# Patient Record
Sex: Female | Born: 1975 | Race: White | Hispanic: No | Marital: Married | Smoking: Never smoker
Health system: Southern US, Community
[De-identification: ages and names within clinical notes are randomized; demographics above are authoritative.]

## PROBLEM LIST (undated history)

## (undated) DIAGNOSIS — M2550 Pain in unspecified joint: Secondary | ICD-10-CM

## (undated) DIAGNOSIS — K589 Irritable bowel syndrome without diarrhea: Secondary | ICD-10-CM

## (undated) DIAGNOSIS — G43909 Migraine, unspecified, not intractable, without status migrainosus: Secondary | ICD-10-CM

## (undated) DIAGNOSIS — N83209 Unspecified ovarian cyst, unspecified side: Secondary | ICD-10-CM

## (undated) DIAGNOSIS — Z9889 Other specified postprocedural states: Secondary | ICD-10-CM

## (undated) DIAGNOSIS — D6851 Activated protein C resistance: Secondary | ICD-10-CM

## (undated) DIAGNOSIS — F909 Attention-deficit hyperactivity disorder, unspecified type: Secondary | ICD-10-CM

## (undated) DIAGNOSIS — C4490 Unspecified malignant neoplasm of skin, unspecified: Secondary | ICD-10-CM

## (undated) DIAGNOSIS — N816 Rectocele: Secondary | ICD-10-CM

## (undated) DIAGNOSIS — F329 Major depressive disorder, single episode, unspecified: Secondary | ICD-10-CM

## (undated) DIAGNOSIS — R600 Localized edema: Secondary | ICD-10-CM

## (undated) DIAGNOSIS — F32A Depression, unspecified: Secondary | ICD-10-CM

## (undated) DIAGNOSIS — M549 Dorsalgia, unspecified: Secondary | ICD-10-CM

## (undated) DIAGNOSIS — Z8742 Personal history of other diseases of the female genital tract: Secondary | ICD-10-CM

## (undated) DIAGNOSIS — L4050 Arthropathic psoriasis, unspecified: Secondary | ICD-10-CM

## (undated) DIAGNOSIS — F419 Anxiety disorder, unspecified: Secondary | ICD-10-CM

## (undated) DIAGNOSIS — T8859XA Other complications of anesthesia, initial encounter: Secondary | ICD-10-CM

## (undated) DIAGNOSIS — D649 Anemia, unspecified: Secondary | ICD-10-CM

## (undated) DIAGNOSIS — C801 Malignant (primary) neoplasm, unspecified: Secondary | ICD-10-CM

## (undated) DIAGNOSIS — L409 Psoriasis, unspecified: Secondary | ICD-10-CM

## (undated) DIAGNOSIS — T4145XA Adverse effect of unspecified anesthetic, initial encounter: Secondary | ICD-10-CM

## (undated) DIAGNOSIS — N200 Calculus of kidney: Secondary | ICD-10-CM

## (undated) HISTORY — DX: Arthropathic psoriasis, unspecified: L40.50

## (undated) HISTORY — DX: Localized edema: R60.0

## (undated) HISTORY — DX: Unspecified malignant neoplasm of skin, unspecified: C44.90

## (undated) HISTORY — DX: Attention-deficit hyperactivity disorder, unspecified type: F90.9

## (undated) HISTORY — DX: Rectocele: N81.6

## (undated) HISTORY — DX: Irritable bowel syndrome, unspecified: K58.9

## (undated) HISTORY — DX: Anxiety disorder, unspecified: F41.9

## (undated) HISTORY — DX: Dorsalgia, unspecified: M54.9

## (undated) HISTORY — DX: Activated protein C resistance: D68.51

## (undated) HISTORY — DX: Other specified postprocedural states: Z98.890

## (undated) HISTORY — DX: Pain in unspecified joint: M25.50

## (undated) HISTORY — PX: SINUS ENDO WITH FUSION: SHX5329

## (undated) HISTORY — PX: CERVICAL BIOPSY  W/ LOOP ELECTRODE EXCISION: SUR135

## (undated) HISTORY — PX: ABDOMINAL HYSTERECTOMY: SHX81

## (undated) HISTORY — DX: Personal history of other diseases of the female genital tract: Z87.42

## (undated) HISTORY — DX: Psoriasis, unspecified: L40.9

## (undated) HISTORY — DX: Migraine, unspecified, not intractable, without status migrainosus: G43.909

## (undated) HISTORY — DX: Depression, unspecified: F32.A

## (undated) HISTORY — DX: Unspecified ovarian cyst, unspecified side: N83.209

## (undated) HISTORY — DX: Calculus of kidney: N20.0

## (undated) HISTORY — DX: Major depressive disorder, single episode, unspecified: F32.9

---
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
## 1995-12-08 DIAGNOSIS — Z8742 Personal history of other diseases of the female genital tract: Secondary | ICD-10-CM

## 1995-12-08 HISTORY — DX: Personal history of other diseases of the female genital tract: Z87.42

## 2001-01-25 ENCOUNTER — Other Ambulatory Visit: Admission: RE | Admit: 2001-01-25 | Discharge: 2001-01-25 | Payer: Self-pay | Admitting: Obstetrics and Gynecology

## 2002-05-15 ENCOUNTER — Other Ambulatory Visit: Admission: RE | Admit: 2002-05-15 | Discharge: 2002-05-15 | Payer: Self-pay | Admitting: Obstetrics and Gynecology

## 2003-05-08 ENCOUNTER — Other Ambulatory Visit: Admission: RE | Admit: 2003-05-08 | Discharge: 2003-05-08 | Payer: Self-pay | Admitting: Obstetrics and Gynecology

## 2004-07-15 ENCOUNTER — Other Ambulatory Visit: Admission: RE | Admit: 2004-07-15 | Discharge: 2004-07-15 | Payer: Self-pay | Admitting: Obstetrics and Gynecology

## 2004-10-08 ENCOUNTER — Encounter: Admission: RE | Admit: 2004-10-08 | Discharge: 2004-10-08 | Payer: Self-pay | Admitting: Otolaryngology

## 2005-07-20 ENCOUNTER — Other Ambulatory Visit: Admission: RE | Admit: 2005-07-20 | Discharge: 2005-07-20 | Payer: Self-pay | Admitting: Obstetrics and Gynecology

## 2006-04-29 ENCOUNTER — Other Ambulatory Visit: Admission: RE | Admit: 2006-04-29 | Discharge: 2006-04-29 | Payer: Self-pay | Admitting: Obstetrics and Gynecology

## 2006-12-02 ENCOUNTER — Inpatient Hospital Stay (HOSPITAL_COMMUNITY): Admission: AD | Admit: 2006-12-02 | Discharge: 2006-12-05 | Payer: Self-pay | Admitting: Obstetrics and Gynecology

## 2006-12-03 ENCOUNTER — Encounter (INDEPENDENT_AMBULATORY_CARE_PROVIDER_SITE_OTHER): Payer: Self-pay | Admitting: *Deleted

## 2008-12-07 HISTORY — PX: TUBAL LIGATION: SHX77

## 2009-01-29 ENCOUNTER — Ambulatory Visit (HOSPITAL_COMMUNITY): Admission: RE | Admit: 2009-01-29 | Discharge: 2009-01-29 | Payer: Self-pay | Admitting: Obstetrics and Gynecology

## 2009-05-30 ENCOUNTER — Inpatient Hospital Stay (HOSPITAL_COMMUNITY): Admission: RE | Admit: 2009-05-30 | Discharge: 2009-06-01 | Payer: Self-pay | Admitting: Obstetrics and Gynecology

## 2009-06-27 ENCOUNTER — Ambulatory Visit: Admission: RE | Admit: 2009-06-27 | Discharge: 2009-06-27 | Payer: Self-pay | Admitting: Obstetrics and Gynecology

## 2010-08-26 ENCOUNTER — Encounter: Admission: RE | Admit: 2010-08-26 | Discharge: 2010-08-26 | Payer: Self-pay | Admitting: Obstetrics and Gynecology

## 2010-12-28 ENCOUNTER — Encounter: Payer: Self-pay | Admitting: Unknown Physician Specialty

## 2011-03-16 LAB — CBC
HCT: 31.3 % — ABNORMAL LOW (ref 36.0–46.0)
HCT: 32.4 % — ABNORMAL LOW (ref 36.0–46.0)
HCT: 38.2 % (ref 36.0–46.0)
Hemoglobin: 11 g/dL — ABNORMAL LOW (ref 12.0–15.0)
Hemoglobin: 11.1 g/dL — ABNORMAL LOW (ref 12.0–15.0)
Hemoglobin: 13.1 g/dL (ref 12.0–15.0)
MCHC: 34.3 g/dL (ref 30.0–36.0)
MCHC: 34.4 g/dL (ref 30.0–36.0)
MCHC: 35.2 g/dL (ref 30.0–36.0)
MCV: 94.1 fL (ref 78.0–100.0)
MCV: 94.7 fL (ref 78.0–100.0)
MCV: 95.1 fL (ref 78.0–100.0)
Platelets: 109 10*3/uL — ABNORMAL LOW (ref 150–400)
Platelets: 123 10*3/uL — ABNORMAL LOW (ref 150–400)
Platelets: 95 10*3/uL — ABNORMAL LOW (ref 150–400)
RBC: 3.31 MIL/uL — ABNORMAL LOW (ref 3.87–5.11)
RBC: 3.4 MIL/uL — ABNORMAL LOW (ref 3.87–5.11)
RBC: 4.05 MIL/uL (ref 3.87–5.11)
RDW: 13.2 % (ref 11.5–15.5)
RDW: 13.5 % (ref 11.5–15.5)
RDW: 13.6 % (ref 11.5–15.5)
WBC: 6.2 10*3/uL (ref 4.0–10.5)
WBC: 7.9 10*3/uL (ref 4.0–10.5)
WBC: 9.4 10*3/uL (ref 4.0–10.5)

## 2011-03-16 LAB — RPR: RPR Ser Ql: NONREACTIVE

## 2011-04-21 NOTE — Op Note (Signed)
NAMEPENNEY, DOMANSKI               ACCOUNT NO.:  [id number]   MEDICAL RECORD NO.:  [id number]          PATIENT TYPE:  INP   LOCATION:  9103                          FACILITY:  [facility]   PHYSICIAN:  Dineen Kid. Rana Snare, M.D.    DATE OF BIRTH:  02/08/1976   DATE OF PROCEDURE:  05/30/2009  DATE OF DISCHARGE:                               OPERATIVE REPORT   PREOPERATIVE DIAGNOSIS:  Intrauterine pregnancy at 39 weeks, previous  cesarean, desired repeat, and multiparity desires sterility.   POSTOPERATIVE DIAGNOSIS:  Intrauterine pregnancy at 39 weeks, previous  cesarean, desired repeat, and multiparity desires sterility.   PROCEDURES:  Repeat low segment transverse cesarean section and tubal  ligation by Filshie clip.   SURGEON:  Dineen Kid. Rana Snare, MD   ANESTHESIA:  Spinal.   INDICATIONS:  Ms. Pirozzi is a 35 year old at 79 weeks, previous cesarean  section, desire repeat and tubal ligation.  Risks and benefits were  discussed.  Informed consent was obtained.   FINDINGS AT TIME OF SURGERY:  Viable female infant, Apgars 9 and 9.   DESCRIPTION OF PROCEDURE:  After adequate analgesia, the patient was  placed in the supine position, left lateral tilt.  She was sterilely  prepped and draped.  The bladder was sterilely drained with the Foley  catheter.  A Pfannenstiel skin incision was made two fingerbreadths  above the pubic symphysis, taken down sharply to fascia, which was  incised transversely, extended superiorly and inferiorly off the bellies  of the rectus muscle, were separated sharply in midline.  Peritoneum was  entered sharply.  Bladder flap created and placed behind the bladder  blade.  A low segment myotomy incision was made down to the infant's  vertex, extended laterally with the operator's fingertips.  Infant was  delivered using one pull vacuum extractor.  Nares and pharynx were then  suctioned.  The cord was clamped, cut, and handed to the pediatrician  for resuscitation.  Cord blood  was obtained.  Placenta extracted  manually.  The uterus was exteriorized, wiped clean with a dry lap.  Myotomy incision closed in two layers, first being running locking  layer, the second being imbricating layer of 0 Monocryl suture.  The  right and left fallopian tubes were identified by the fimbriated end.  Midportion of fallopian tube was identified.  Filshie clip was placed  across the entirety of the fallopian tubes.  Good placement noted across  the entirety of the tube.  The uterus then placed back into the  peritoneal cavity and after copious amount of irrigation, adequate  hemostasis was assured.  The peritoneum was closed with 0 Monocryl  suture.  Rectus muscle plicated in the midline.  Irrigation was applied  and after adequate hemostasis, the fascia was closed with #1 Vicryl in a  running fashion with good  approximation and good hemostasis noted.  Irrigation was applied and  after adequate hemostasis, skin stapled, Steri-Strips applied.  The  patient tolerated the procedure well, was stable on transfer to recovery  room.  Estimated blood loss, 700 mL.  Patient received 1 g of cefotetan  preoperatively.      Dineen Kid Rana Snare, M.D.  Electronically Signed     DCL/MEDQ  D:  05/30/2009  T:  05/31/2009  Job:  [id number]

## 2011-04-21 NOTE — Discharge Summary (Signed)
Autumn [name], Autumn [name]               ACCOUNT NO.:  [id number]   MEDICAL RECORD NO.:  [id number]          PATIENT TYPE:  INP   LOCATION:  9103                          [facility]ACILITY:  [facility]   PHYSICIAN:  Freddy Finner, M.D.   DATE OF BIRTH:  [birth year]   DATE OF ADMISSION:  05/30/2009  DATE OF DISCHARGE:  06/01/2009                               DISCHARGE SUMMARY   ADMITTING DIAGNOSES:  1. Intrauterine pregnancy at 85 weeks estimated gestational age.  2. Previous cesarean section, desires repeat.  3. Multiparity, desires permanent sterilization.   DISCHARGE DIAGNOSES:  1. Status post low-transverse cesarean section.  2. A viable female infant.  3. Permanent sterilization.   PROCEDURE:  1. Repeat low-transverse cesarean section.  2. Bilateral tubal ligation.   REASON FOR ADMISSION:  Please see written H and P.   HOSPITAL COURSE:  The patient is a 35 year old gravida 2, para 1 that  was admitted to [facility] for scheduled cesarean  section.  The patient had had a previous cesarean delivery and desired  repeat.  Due to multiparity, the patient also requested permanent  sterilization.  On the morning of admission, the patient was taken to  the operating room where spinal anesthesia was administered without  difficulty.  A low-transverse incision was made with delivery of a  viable female infant with Apgars of 9 at 1 minute and 9 at 5 minutes.  The  patient tolerated the procedure well and was taken to the recovery room  in stable condition.  On postoperative day #1, the patient was without  complaint.  Vital signs were stable.  She was afebrile.  Blood pressure  was 84/57 to 91/56.  Abdomen soft.  Fundus firm and nontender.  Abdominal dressings were noted to have a small amount of drainage noted  on the bandage.  Foley had been discontinued and the patient had not  voided at the time of rounding.   Laboratory findings showed hemoglobin of 11.0 and platelet count of  95,000.  CBC was ordered for the following morning.  On postoperative  day #2, the patient did desire early discharge.  Vital signs were  stable.  She was afebrile.  Abdomen soft.  Fundus firm and nontender.  Incision was noted to have a small amount of drainage noted from the  left part of the incision, no erythema or seroma was noted.  Staples  were left intact.  Repeat laboratory findings of a hemoglobin 11.5 and  platelet count was up to 109,000.  Discharge instructions were reviewed  and the patient was later discharged home.   CONDITION ON DISCHARGE:  Stable.   DIET:  Regular as tolerated.   ACTIVITY:  No heavy lifting, no driving x2 weeks, and no vaginal entry.   FOLLOWUP:  The patient is to follow up in the office in 2-3 days for  staple removal.  She is to call for temperature greater than 100  degrees, persistent nausea, vomiting, heavy vaginal bleeding and/or  redness, or drainage from the incisional site.   DISCHARGE MEDICATIONS:  1. Motrin 600 mg every 6 hours.  2. Prenatal vitamins 1 p.o. daily.      Julio Sicks, N.P.      Freddy Finner, M.D.  Electronically Signed    CC/MEDQ  D:  06/01/2009  T:  06/01/2009  Job:  [id number]

## 2011-04-21 NOTE — H&P (Signed)
NAMESELMA, Autumn [name]               ACCOUNT NO.:  [id number]   MEDICAL RECORD NO.:  [id number]          PATIENT TYPE:  INP   LOCATION:  NA                            [facility]  [facility]   PHYSICIAN:  Dineen Kid. Rana Snare, M.D.    DATE OF BIRTH:  Oct 17, 1976   DATE OF ADMISSION:  05/30/2009  DATE OF DISCHARGE:                              HISTORY & PHYSICAL   HISTORY OF PRESENT ILLNESS:  Ms. [name] is a 35 year old G5, P1, A3 at  62 weeks' gestational age who presents for repeat cesarean section.  Her  pregnancy has been complicated only by a previous cesarean section,  desires repeat.  She also has multiparity and desires sterility.  Her  EDC is June 06, 2009.   PAST MEDICAL HISTORY:  Negative.   PAST SURGICAL HISTORY:  She has had knee surgery, previous cesarean  section, rhinoplasty, ovarian cyst surgery, and sinus surgery.   MEDICATIONS:  Prenatal vitamins.   ALLERGIES:  She has no known drug allergies.   PHYSICAL EXAMINATION:  VITAL SIGNS:  Her blood pressure is 110/78.  HEART:  Regular rate and rhythm.  LUNGS:  Clear to auscultation bilaterally.  ABDOMEN:  Gravid, nontender.  Fetal heart tones are in 140s to 150s.  PELVIC:  Cervix is 2, 50 and high.   IMPRESSION:  Intrauterine pregnancy at 39+ weeks.  Previous cesarean  section, desires repeat.   PLAN:  1. Repeat low segment transverse cesarean section.  2. Multiparity, desires sterility, planned tubal ligation.  3. Discussed the risks and benefits of the above procedures which      include but are not limited to risk of infection, bleeding, damage      to the uterus, tubes, ovaries, bowel, bladder, fetus, risk of tubal      failure quoted at 04/999.  She gives informed consent and wishes to      proceed.      Dineen Kid Rana Snare, M.D.  Electronically Signed     DCL/MEDQ  D:  05/29/2009  T:  05/29/2009  Job:  [id number]

## 2011-04-24 NOTE — Discharge Summary (Signed)
[name], [name]               ACCOUNT NO.:  [id number]   MEDICAL RECORD NO.:  [id number]          PATIENT TYPE:   LOCATION:                                 FACILITY:   PHYSICIAN:  Michelle L. Vincente Poli, M.D.    DATE OF BIRTH:   DATE OF ADMISSION:  12/03/2006  DATE OF DISCHARGE:  12/05/2006                               DISCHARGE SUMMARY   ADMITTING DIAGNOSES:  1. Intrauterine pregnancy at term.  2. Spontaneous onset of labor.   DISCHARGE DIAGNOSIS:  1. Status post low-transverse cesarean section secondary to failure to      progress.  2. A viable female infant.   PROCEDURE:  Primary low-transverse cesarean section.   REASON FOR ADMISSION:  Please see written H and P.   HOSPITAL COURSE:  The patient is 35 year old, gravida 4, para 0, who was  admitted to [facility] at 40-2/7 weeks with spontaneous  onset of labor.  On admission, vital signs were stable.  Fetal heart  tones were in the 140s with reactivity.  Cervix was dilated to 3-4 cm,  completely efface, vertex plus or minus 1 station.  Artificial rupture  of membranes was performed with clear fluid noted.  Epidural was  administered for the patient's comfort.  The patient did progress to  full dilatation.  And after pushing for approximately 2 hours without  further descent beyond a zero station, the decision was made to proceed  with a primary low-transverse cesarean section.  The patient was then  transferred to the operating room where an epidural was dosed to an  adequate surgical level.  A low transverse incision was made with the  delivery of a viable female infant, weighing 8 pounds 9 ounces with Apgars  at 8 at 1 minute and 9 at 5 minutes.  The patient tolerated the  procedure well and was taken to the recovery room in stable condition.  On postoperative day #1, the patient was without complaint.  Vital signs  were stable.  She was afebrile.  Abdomen soft.  Fundus firm and  nontender.  Abdominal  dressing was noted to be clean, dry, and intact.   LABORATORY FINDINGS:  Revealed a hemoglobin of 11.9, platelet count  105,000, WBC count of 13.9. On postoperative day #2, the patient did  desire early discharge.  Vital signs were stable.  She was afebrile.  Abdomen soft.  Fundus firm and nontender.  Incision was clean, dry, and  intact.  Steri-Strips were applied.  The patient was later discharged  home.   CONDITION ON DISCHARGE:  Good.   DIET:  Regular as tolerated.   ACTIVITY:  No heavy lifting, no driving x2 weeks, no vaginal entry.   FOLLOW UP:  The patient to follow up in the office in 2 days for an  incision check.  She is to call for a temperature greater than 100  degrees, persistent nausea, vomiting, or any heavy vaginal bleeding,  and/or redness or drainage from the incisional site.   DISCHARGE MEDICATIONS:  1. Percocet 5/325 #30, one p.o. q. 4-6 hours p.r.n.  2. Motrin 600  mg every 6 hours.  3. Prenatal vitamins 1 p.o. daily.  4. Colace 100 p.o. daily p.r.n.      Julio Sicks, N.P.      Stann Mainland. Vincente Poli, M.D.  Electronically Signed    CC/MEDQ  D:  12/24/2006  T:  12/24/2006  Job:  [id number]

## 2011-04-24 NOTE — Op Note (Signed)
Autumn [name], Autumn [name]               ACCOUNT NO.:  [id number]   MEDICAL RECORD NO.:  [id number]          PATIENT TYPE:  INP   LOCATION:  9147                          FACILITY:  [facility]   PHYSICIAN:  Zelphia Cairo, MD    DATE OF BIRTH:  04-12-1976   DATE OF PROCEDURE:  12/03/2006  DATE OF DISCHARGE:                               OPERATIVE REPORT   PREOPERATIVE DIAGNOSES:  1. Intrauterine pregnancy at term.  2. Failure to progress/cephalopelvic disproportion.   PROCEDURE:  The primary low transverse cesarean delivery.   SURGEON:  Zelphia Cairo, MD   ESTIMATED BLOOD LOSS:  600 mL.   FINDINGS:  A viable infant with Apgars of 8 and 9 weighing 3900 grams.  Normal-appearing pelvic anatomy.   ANESTHESIA:  Epidural.   COMPLICATIONS:  None.   CONDITION:  Stable to recovery room.   SPECIMEN:  Placenta to pathology.   PROCEDURE:  The patient was taken to the operating room, where epidural  anesthesia was found to be adequate.  She was placed in a supine  position with a left tilt.  She was prepped and draped in sterile  fashion.  A Foley catheter was inserted in the bladder.  A scalpel was  used to make a skin incision.  This was carried down to the underlying  fascia.  The fascia was incised in the midline and this was extended  laterally.  The fascia was tented upwards with Kocher clamps and the  underlying rectus muscles were dissected off using the Bovie.  The  peritoneum was then identified, tented upwards, and entered sharply  using Metzenbaum scissors.  This was extended superiorly with good  visualization of the bladder.  The bladder blade was then inserted and  the bladder flap created with Metzenbaum scissors.  The bladder blade  was then reinserted to protect the bladder flap.   A scalpel was then used to make a uterine incision.  This was extended  bluntly.  The infant's vertex was brought to the uterine incision and a  vacuum was placed on the scalp.  The fetal head  was delivered through  the incision with fundal pressure.  The vacuum was released.  A nuchal  cord times one was easily reduced and the shoulders and body were  delivered without problems.  The infant's mouth and nose were suctioned  as the cord was clamped and cut and the infant was taken to the awaiting  pediatrics staff.  The placenta was then manually removed from the  uterus.  The uterus was then cleared of all clots and debris using a dry  sponge.  The uterus was then exteriorized from the pelvis.  The uterine  incision was closed using chromic in a running locked fashion,  hemostasis was assured, and the uterus was delivered back into the  pelvis.  The pelvis was then copiously irrigated with warm normal  saline.  The  peritoneum was closed using Vicryl.  The fascia was closed using PDS and  the skin was closed with staples.  The patient tolerated the procedure  well.  Sponge, lap, needle and  instrument counts were correct.  The  patient was taken to the recovery room in stable condition.      Zelphia Cairo, MD  Electronically Signed     GA/MEDQ  D:  12/03/2006  T:  12/03/2006  Job:  [id number]

## 2012-06-07 DIAGNOSIS — N946 Dysmenorrhea, unspecified: Secondary | ICD-10-CM | POA: Insufficient documentation

## 2012-06-07 DIAGNOSIS — C439 Malignant melanoma of skin, unspecified: Secondary | ICD-10-CM | POA: Insufficient documentation

## 2012-06-07 DIAGNOSIS — N816 Rectocele: Secondary | ICD-10-CM

## 2012-06-07 DIAGNOSIS — E669 Obesity, unspecified: Secondary | ICD-10-CM | POA: Insufficient documentation

## 2012-06-07 DIAGNOSIS — N200 Calculus of kidney: Secondary | ICD-10-CM

## 2012-06-07 DIAGNOSIS — G43829 Menstrual migraine, not intractable, without status migrainosus: Secondary | ICD-10-CM | POA: Insufficient documentation

## 2012-06-07 HISTORY — DX: Rectocele: N81.6

## 2012-06-07 HISTORY — DX: Calculus of kidney: N20.0

## 2013-03-13 ENCOUNTER — Other Ambulatory Visit: Payer: Self-pay | Admitting: Dermatology

## 2013-09-14 ENCOUNTER — Other Ambulatory Visit: Payer: Self-pay | Admitting: Dermatology

## 2014-04-24 ENCOUNTER — Other Ambulatory Visit: Payer: Self-pay | Admitting: Family Medicine

## 2014-04-24 ENCOUNTER — Ambulatory Visit
Admission: RE | Admit: 2014-04-24 | Discharge: 2014-04-24 | Disposition: A | Discharge status: Home / Self Care | Payer: BC Managed Care – PPO | Source: Ambulatory Visit | Attending: Family Medicine | Admitting: Family Medicine

## 2014-04-24 DIAGNOSIS — N63 Unspecified lump in unspecified breast: Secondary | ICD-10-CM

## 2014-04-24 DIAGNOSIS — N644 Mastodynia: Secondary | ICD-10-CM

## 2014-04-24 DIAGNOSIS — R0781 Pleurodynia: Secondary | ICD-10-CM

## 2014-05-04 ENCOUNTER — Ambulatory Visit
Admission: RE | Admit: 2014-05-04 | Discharge: 2014-05-04 | Disposition: A | Discharge status: Home / Self Care | Payer: BC Managed Care – PPO | Source: Ambulatory Visit | Attending: Family Medicine | Admitting: Family Medicine

## 2014-05-04 DIAGNOSIS — N63 Unspecified lump in unspecified breast: Secondary | ICD-10-CM

## 2014-05-04 DIAGNOSIS — N644 Mastodynia: Secondary | ICD-10-CM

## 2014-12-18 ENCOUNTER — Other Ambulatory Visit: Payer: Self-pay | Admitting: Obstetrics and Gynecology

## 2014-12-19 LAB — CYTOLOGY - PAP

## 2015-01-07 HISTORY — PX: VULVA /PERINEUM BIOPSY: SHX319

## 2015-01-10 ENCOUNTER — Other Ambulatory Visit: Payer: Self-pay | Admitting: Obstetrics and Gynecology

## 2015-05-16 ENCOUNTER — Ambulatory Visit (INDEPENDENT_AMBULATORY_CARE_PROVIDER_SITE_OTHER): Payer: BLUE CROSS/BLUE SHIELD | Admitting: Obstetrics & Gynecology

## 2015-05-16 ENCOUNTER — Encounter: Payer: Self-pay | Admitting: Obstetrics & Gynecology

## 2015-05-16 VITALS — BP 124/78 | HR 78 | Resp 14 | Ht 61.75 in | Wt 175.0 lb

## 2015-05-16 DIAGNOSIS — F39 Unspecified mood [affective] disorder: Secondary | ICD-10-CM | POA: Diagnosis not present

## 2015-05-16 DIAGNOSIS — N8110 Cystocele, unspecified: Secondary | ICD-10-CM | POA: Diagnosis not present

## 2015-05-16 DIAGNOSIS — N951 Menopausal and female climacteric states: Secondary | ICD-10-CM

## 2015-05-16 DIAGNOSIS — R4586 Emotional lability: Secondary | ICD-10-CM

## 2015-05-16 DIAGNOSIS — IMO0002 Reserved for concepts with insufficient information to code with codable children: Secondary | ICD-10-CM

## 2015-05-16 DIAGNOSIS — N816 Rectocele: Secondary | ICD-10-CM

## 2015-05-16 MED ORDER — BUPROPION HCL ER (XL) 150 MG PO TB24: 150.0000 mg | ORAL_TABLET | Freq: Every day | ORAL | Status: DC

## 2015-05-16 NOTE — Progress Notes (Addendum)
Patient ID: Autumn [name], female   DOB: [birth year], 39 y.o.   MRN: [id number]   39 y.o. G2P2 MarriedCaucasianF to discuss her rectocele and possible surgery in the future.  Pt had hx of very long delivery with first pregnancy.  She pushed for two hours and then went for a cesarean section.  Second delivery was planned cesarean section.  Does work really hard at keeping stool soft.  Doesn't have fecal or urinary incontinence.  Feels she can feel bulge at times right at the vagina.  Thinks is is coming from posterior aspect of vagina.  Has seen [name] who recommended surgery.  Wants another opinion.  Pt does feel like she needs to lose weight.  She is about 30-40 pounds heavier than she was at her wedding.  Spoke with [name] about this.  Blood work was all normal.  "Hormones and thyroid testing was negative".  Pt was treated with Lexapo and Phentermine.  Lexapro caused decreased libido which was significant for pt so she stopped this.  Has been on Prozac in the past but this caused fatigue.  Pt didn't really lose any weight with Phentermine.  She took it for four or five months, in total.  Pt weaned herself off the Lexapro and Wellbutrin.  Felt some of her symptoms were improved like the fatigue and libido issues but she knows she does have depressed mood since stopping.  Would like some recommendations.  Patient's last menstrual period was 05/15/2015.          Sexually active: Yes.    The current method of family planning is tubal ligation.    Exercising: Yes.    cardio and strength training Smoker:  no  Health Maintenance: Pap:  12-18-14 WNL  History of abnormal Pap:  Yes 1997 had LEEP procedure MMG:  09/2014 abnormal- U/S done as well- benign [facility]  Colonoscopy:  Never   BMD:   Never TDaP:  2013 with PCP Dr. Ernie Hew    reports that she has never smoked. She has never used smokeless tobacco. She reports that she drinks about 3.0 oz of alcohol per week. She reports that she does not  use illicit drugs.  Past Medical History  Diagnosis Date  . Rectocele   . Depression   . Anxiety   . History of abnormal cervical Pap smear 1997    dysplasia- LEEP  . Ovarian cyst 1994  . Migraine     Past Surgical History  Procedure Laterality Date  . Cervical biopsy  w/ loop electrode excision    . Ovarian cyst surgery  1994  . Cesarean section  2007/2010  . Tubal ligation  2010  . Vulva /perineum biopsy  01/2015    Current Outpatient Prescriptions  Medication Sig Dispense Refill  . fluticasone (FLONASE) 50 MCG/ACT nasal spray Place 1 spray into both nostrils daily.    . rizatriptan (MAXALT) 10 MG tablet Take 10 mg by mouth as needed for migraine. May repeat in 2 hours if needed    . BEPREVE 1.5 % SOLN   98  . buPROPion (WELLBUTRIN XL) 300 MG 24 hr tablet   5   No current facility-administered medications for this visit.    No family history on file.  ROS:  Pertinent items are noted in HPI.  Otherwise, a comprehensive ROS was negative.  Exam:   BP 124/78 mmHg  Pulse 78  Resp 14  Ht 5' 1.75" (1.568 m)  Wt 175 lb (79.379 kg)  BMI 32.29 kg/m2  LMP 05/15/2015   Height: 5' 1.75" (156.8 cm)  Ht Readings from Last 3 Encounters:  05/16/15 5' 1.75" (1.568 m)    General appearance: alert, cooperative and appears stated age Head: Normocephalic, without obvious abnormality, atraumatic Abdomen: soft, non-tender; bowel sounds normal; no masses,  no organomegaly Extremities: extremities normal, atraumatic, no cyanosis or edema Skin: Skin color, texture, turgor normal. No rashes or lesions Lymph nodes: Cervical, supraclavicular, and axillary nodes normal. No abnormal inguinal nodes palpated Neurologic: Grossly normal  Pelvic: External genitalia:  no lesions              Urethra:  normal appearing urethra with no masses, tenderness or lesions              Bartholins and Skenes: normal                 Vagina: normal appearing vagina with normal color and discharge, no  lesions, second degree cystocele and second degree recotcele              Cervix: no lesions              Pap taken: No. Bimanual Exam:  Uterus:  normal size, contour, position, consistency, mobility, non-tender              Adnexa: no mass, fullness, tenderness               Rectovaginal: Confirms               Anus:  normal sphincter tone, no lesions  Chaperone was present for exam.  A:  Cystocele and rectocele, Grade 2 (pt describes symptoms consistent with grade 4 rectocele) Depression H/O Cesarean section x 2 H/O LEEP 1997  P:   D/w pt surgery vs conservative management.  My honest opinion is that her exam today does not show enough significance for surgery and I would highly encourage a consultation with pelvic PT before proceeding with surgery.  Hysterectomy with anterior and posterior repair involving Dr. Quincy Simmonds was discussed with pt.  She understands another surgeon will be involved and is very comfortable with this.  Discussed involving Dr. Matilde Sprang as well, as another option, but she is not interested in this.   D/W pt treatment options for depression--SSRI's, SNRI's and Wellutrin with typical side effects of each class.  Pt was splitting Wellbutrin Xl in 1/2.  Feel should restart Wellbutrin XL 150mg  qd.  Advised pt not to split in half as this is an XL medication.  #30/5RF Day 3 FSH.  Pt on day 2 of cycle.  Will return tomorrow for this lab.  Doubtful of perimenopause.  However, can consider treatment with HRT if this is the case. Pt is considering homeopathic consultation.  D/W pt offices where I feel they are more conservative.  Pt will decide about this and make own appt. Recheck 2-3 months and pt will needs AEX after 1/17.  Lengthy visit with pt, approximately 45 minutes with >50% of this spent in face to face discussion.   06/07/15  Outside records reviewed.  Last pap normal.  No HR HPV testing 12/18/14.

## 2015-05-17 ENCOUNTER — Other Ambulatory Visit (INDEPENDENT_AMBULATORY_CARE_PROVIDER_SITE_OTHER): Payer: BLUE CROSS/BLUE SHIELD

## 2015-05-17 DIAGNOSIS — N951 Menopausal and female climacteric states: Secondary | ICD-10-CM

## 2015-05-18 LAB — FOLLICLE STIMULATING HORMONE: FSH: 8.5 m[IU]/mL

## 2015-06-18 ENCOUNTER — Telehealth: Payer: Self-pay | Admitting: Obstetrics & Gynecology

## 2015-06-18 NOTE — Telephone Encounter (Signed)
Patient is having side effects on the Wellbutrin and would like to stop taking. Would like to speak with nurse.

## 2015-06-18 NOTE — Telephone Encounter (Signed)
Spoke with patient. Patient states that she is currently taking Wellbutrin XL 150 mg daily. Since starting she has been having "forgetfullness" and tingling in her left finger tips. Patient would like to come off of Wellbutrin at this time. Has appointment scheduled for 08/01/2015 at 8:30am with Dr.Miller. Would like to discuss other options at that appointment and how she is doing after coming off the medication. Advised will need to take Wellbutrin XL 150 every other day for two weeks and then stop. Patient is agreeable. If symptoms persist after stopping medication will need to see PCP. If she would like to start new med after coming off before appointment in August will call our office.  Routing to provider for final review. Patient agreeable to disposition. Will close encounter.   Patient aware provider will review message and nurse will return call if any additional advice or change of disposition.

## 2015-08-01 ENCOUNTER — Ambulatory Visit (INDEPENDENT_AMBULATORY_CARE_PROVIDER_SITE_OTHER): Payer: BLUE CROSS/BLUE SHIELD | Admitting: Obstetrics & Gynecology

## 2015-08-01 VITALS — BP 104/64 | HR 64 | Resp 16 | Wt 180.0 lb

## 2015-08-01 DIAGNOSIS — N8110 Cystocele, unspecified: Secondary | ICD-10-CM

## 2015-08-01 DIAGNOSIS — R41840 Attention and concentration deficit: Secondary | ICD-10-CM | POA: Diagnosis not present

## 2015-08-01 DIAGNOSIS — IMO0002 Reserved for concepts with insufficient information to code with codable children: Secondary | ICD-10-CM

## 2015-08-01 DIAGNOSIS — F39 Unspecified mood [affective] disorder: Secondary | ICD-10-CM | POA: Diagnosis not present

## 2015-08-01 DIAGNOSIS — R4586 Emotional lability: Secondary | ICD-10-CM

## 2015-08-01 NOTE — Progress Notes (Signed)
39 y.o. G2P2 MarriedCaucasianF here for follow up of pelvic relaxation/cystocele, mood changes, weight changes, fatigue.  In regards to pelvic relaxation, pt has gone to physical therapy twice and doing exercises at home.  She feels these appts have been very helpful and she will plan to go for a couple more visits, at least.  Knows this will not "fix" problem but can, at least, help her hold off on surgery until something changes with symptoms.    In regards to mood, pt did start Wellbutrin XL 150mg  and didn't cut pills in half.  Pt felt like she had confusion with this.  She weaned herself off over another three to four weeks.  Has used Lexapro with phentermine which she said made her feel the best but she knows she can't take Phentermine long term.  Also, the Lexapro caused decreased libido.  Also tried Prozac which caused fatigue.  She was taking generic at the time.    Reports she feels moody all of the time and like she has PMS all of the time.  She also feels like she has fatigue a lot but she attributes this to her weight issues.  Pt has been considering her fatigue as well and feels part of her issue is never getting anything completely done so she runs around without completing tasks.  She is really interested in have ADD testing.  Has considered this for some time but never brought this up with anyone.  In regards to weight, pt has been on phentermine without significant weight loss.  Options for nutritionist or weight loss clinic discussed.  Pt will wait to see about ADD evaluation.  Knows these medications typically cause appetite suppression and weight loss.  A;  Depression/moodiness Cystocele and rectocele, Grade 2 but per hx at first exam seems more like Grade 4 Attention issues Weight gain  P: Information about testing for ADD given.  Pt will call clinic to schedule as they only allow self referral.   SNRI/SSRI options discussed.  Pt not sure if wants to start anything else but  cymbalta 30mg  daily or low dosed OCP are options.  Will let me know if wants this. Continue follow up with PT  ~25 minutes spent with patient >50% of time was in face to face discussion of above.

## 2015-08-02 ENCOUNTER — Encounter: Payer: Self-pay | Admitting: Obstetrics & Gynecology

## 2015-08-15 ENCOUNTER — Ambulatory Visit: Payer: BLUE CROSS/BLUE SHIELD | Admitting: Obstetrics & Gynecology

## 2015-09-10 ENCOUNTER — Other Ambulatory Visit: Payer: Self-pay

## 2015-09-10 DIAGNOSIS — Z1231 Encounter for screening mammogram for malignant neoplasm of breast: Secondary | ICD-10-CM

## 2015-10-09 ENCOUNTER — Ambulatory Visit
Admission: RE | Admit: 2015-10-09 | Discharge: 2015-10-09 | Disposition: A | Discharge status: Home / Self Care | Payer: BLUE CROSS/BLUE SHIELD | Source: Ambulatory Visit

## 2015-10-09 DIAGNOSIS — Z1231 Encounter for screening mammogram for malignant neoplasm of breast: Secondary | ICD-10-CM

## 2016-01-14 ENCOUNTER — Encounter: Payer: Self-pay | Admitting: Obstetrics & Gynecology

## 2016-01-14 ENCOUNTER — Ambulatory Visit (INDEPENDENT_AMBULATORY_CARE_PROVIDER_SITE_OTHER): Payer: BLUE CROSS/BLUE SHIELD | Admitting: Obstetrics & Gynecology

## 2016-01-14 VITALS — BP 106/80 | HR 86 | Resp 16 | Ht 61.75 in | Wt 165.0 lb

## 2016-01-14 DIAGNOSIS — Z124 Encounter for screening for malignant neoplasm of cervix: Secondary | ICD-10-CM

## 2016-01-14 DIAGNOSIS — Z01419 Encounter for gynecological examination (general) (routine) without abnormal findings: Secondary | ICD-10-CM

## 2016-01-14 NOTE — Progress Notes (Signed)
40 y.o. G2P2 MarriedCaucasianF here for annual exam.  Going to [name] now for management of ADD.  Reports she feels much better.  She has been on Adderall XR 25mg  since October.  Going every three months.  Pt feels like she is getting a lot more done and is being more effective in the gyn as she feels much more focused.    Cystocele and rectocele symptoms are improved as well.  Did see the physical therapist.  Doing some of the exercises at home.  Also seeing a nutritionist.  Feels like she is just a lot more in control.  Cycles are about every 26 days.  Flow lasts 5 days with some spotting for up to two more days.    PCP:  Dr. Ernie Hew  Patient's last menstrual period was 01/02/2016.          Sexually active: Yes.    The current method of family planning is tubal ligation.    Exercising: Yes.    Circuit training, cardio weights Smoker:  no  Health Maintenance: Pap:  12/18/14 neg History of abnormal Pap:  Yes, Hx LEEP MMG: 10/11/15 BIRADS1:neg Colonoscopy:  Never BMD:   Never TDaP:  2013 w/ PCP Screening Labs: PCP, Hb today: PCP, Urine today: PCP   reports that she has never smoked. She has never used smokeless tobacco. She reports that she drinks about 3.0 oz of alcohol per week. She reports that she does not use illicit drugs.  Past Medical History  Diagnosis Date  . Rectocele   . Depression   . Anxiety   . History of abnormal cervical Pap smear 1997    dysplasia- LEEP  . Ovarian cyst 1994  . Migraine     Past Surgical History  Procedure Laterality Date  . Cervical biopsy  w/ loop electrode excision    . Ovarian cyst surgery  1994  . Cesarean section  2007/2010  . Tubal ligation  2010  . Vulva /perineum biopsy  01/2015    Current Outpatient Prescriptions  Medication Sig Dispense Refill  . ADDERALL XR 25 MG 24 hr capsule Take 1 tablet by mouth daily.  0  . clobetasol (TEMOVATE) 0.05 % external solution   1  . fluticasone (FLONASE) 50 MCG/ACT nasal spray Place 1  spray into both nostrils daily.    . naproxen (NAPROSYN) 250 MG tablet Take 250 mg by mouth as needed.    . Probiotic Product (PROBIOTIC DAILY) CAPS Take 1 capsule by mouth daily.    . rizatriptan (MAXALT) 10 MG tablet Take 10 mg by mouth as needed for migraine. May repeat in 2 hours if needed     No current facility-administered medications for this visit.    History reviewed. No pertinent family history.  ROS:  Pertinent items are noted in HPI.  Otherwise, a comprehensive ROS was negative.  Exam:   BP 106/80 mmHg  Pulse 86  Resp 16  Ht 5' 1.75" (1.568 m)  Wt 165 lb (74.844 kg)  BMI 30.44 kg/m2  LMP 01/02/2016  Weight change: -15#  Height: 5' 1.75" (156.8 cm)  Ht Readings from Last 3 Encounters:  01/14/16 5' 1.75" (1.568 m)  05/16/15 5' 1.75" (1.568 m)    General appearance: alert, cooperative and appears stated age Head: Normocephalic, without obvious abnormality, atraumatic Neck: no adenopathy, supple, symmetrical, trachea midline and thyroid normal to inspection and palpation Lungs: clear to auscultation bilaterally Breasts: normal appearance, no masses or tenderness Heart: regular rate and rhythm Abdomen: soft,  non-tender; bowel sounds normal; no masses,  no organomegaly Extremities: extremities normal, atraumatic, no cyanosis or edema Skin: Skin color, texture, turgor normal. No rashes or lesions Lymph nodes: Cervical, supraclavicular, and axillary nodes normal. No abnormal inguinal nodes palpated Neurologic: Grossly normal   Pelvic: External genitalia:  no lesions              Urethra:  normal appearing urethra with no masses, tenderness or lesions              Bartholins and Skenes: normal                 Vagina: normal appearing vagina with normal color and discharge, no lesions              Cervix: no lesions              Pap taken: Yes.   Bimanual Exam:  Uterus:  normal size, contour, position, consistency, mobility, non-tender              Adnexa: normal  adnexa and no mass, fullness, tenderness               Rectovaginal: Confirms               Anus:  normal sphincter tone, no lesions  Chaperone was present for exam.  A:  Well Woman with normal exam ADD H/O melanoma, seeing [name] every eight months H/O LEEP around 1995 Lab work  P:   Mammogram guidelines discussed. pap smear with HR HPV testing today D/w pt IUD use vs endometrial ablation.  Information provided. return annually or prn

## 2016-01-17 LAB — IPS PAP TEST WITH HPV

## 2016-04-02 DIAGNOSIS — H10403 Unspecified chronic conjunctivitis, bilateral: Secondary | ICD-10-CM | POA: Diagnosis not present

## 2016-04-09 DIAGNOSIS — F338 Other recurrent depressive disorders: Secondary | ICD-10-CM | POA: Diagnosis not present

## 2016-04-09 DIAGNOSIS — F429 Obsessive-compulsive disorder, unspecified: Secondary | ICD-10-CM | POA: Diagnosis not present

## 2016-04-09 DIAGNOSIS — F900 Attention-deficit hyperactivity disorder, predominantly inattentive type: Secondary | ICD-10-CM | POA: Diagnosis not present

## 2016-04-09 DIAGNOSIS — F419 Anxiety disorder, unspecified: Secondary | ICD-10-CM | POA: Diagnosis not present

## 2016-04-09 DIAGNOSIS — Z79899 Other long term (current) drug therapy: Secondary | ICD-10-CM | POA: Diagnosis not present

## 2016-04-09 DIAGNOSIS — R41840 Attention and concentration deficit: Secondary | ICD-10-CM | POA: Diagnosis not present

## 2016-05-14 DIAGNOSIS — Z79899 Other long term (current) drug therapy: Secondary | ICD-10-CM | POA: Diagnosis not present

## 2016-05-14 DIAGNOSIS — F902 Attention-deficit hyperactivity disorder, combined type: Secondary | ICD-10-CM | POA: Diagnosis not present

## 2016-07-27 DIAGNOSIS — Z85820 Personal history of malignant melanoma of skin: Secondary | ICD-10-CM | POA: Diagnosis not present

## 2016-07-27 DIAGNOSIS — D224 Melanocytic nevi of scalp and neck: Secondary | ICD-10-CM | POA: Diagnosis not present

## 2016-07-27 DIAGNOSIS — D2262 Melanocytic nevi of left upper limb, including shoulder: Secondary | ICD-10-CM | POA: Diagnosis not present

## 2016-07-27 DIAGNOSIS — L814 Other melanin hyperpigmentation: Secondary | ICD-10-CM | POA: Diagnosis not present

## 2016-08-13 DIAGNOSIS — F429 Obsessive-compulsive disorder, unspecified: Secondary | ICD-10-CM | POA: Diagnosis not present

## 2016-08-13 DIAGNOSIS — F900 Attention-deficit hyperactivity disorder, predominantly inattentive type: Secondary | ICD-10-CM | POA: Diagnosis not present

## 2016-08-13 DIAGNOSIS — Z79899 Other long term (current) drug therapy: Secondary | ICD-10-CM | POA: Diagnosis not present

## 2016-08-13 DIAGNOSIS — F338 Other recurrent depressive disorders: Secondary | ICD-10-CM | POA: Diagnosis not present

## 2016-08-26 ENCOUNTER — Other Ambulatory Visit: Payer: Self-pay | Admitting: Obstetrics & Gynecology

## 2016-08-26 ENCOUNTER — Telehealth: Payer: Self-pay | Admitting: Obstetrics & Gynecology

## 2016-08-26 DIAGNOSIS — Z1231 Encounter for screening mammogram for malignant neoplasm of breast: Secondary | ICD-10-CM

## 2016-08-26 NOTE — Telephone Encounter (Signed)
Returned call to patient. Patient states that when she was here for her AEX back in February, she and [name] discussed OCPs vs ablation vs getting an IUD. Patient states she has taken a few months to think about it and she desires to use OCP's. Patient requesting an appointment to come in and talk with [name] about what OCP would work best for her. Office visit scheduled for Tuesday 09/01/16 at 0830. Patient agreeable to date and time and aware to arrive 15 minutes early for appointment.  Routing to provider for final review. Patient agreeable to disposition. Will close encounter.

## 2016-08-26 NOTE — Telephone Encounter (Signed)
Patient calling for an appointment to discus birth control options with Dr Sabra Heck.

## 2016-09-01 ENCOUNTER — Encounter: Payer: Self-pay | Admitting: Obstetrics & Gynecology

## 2016-09-01 ENCOUNTER — Ambulatory Visit (INDEPENDENT_AMBULATORY_CARE_PROVIDER_SITE_OTHER): Payer: BLUE CROSS/BLUE SHIELD | Admitting: Obstetrics & Gynecology

## 2016-09-01 VITALS — BP 102/70 | HR 76 | Resp 14 | Ht 62.0 in | Wt 173.2 lb

## 2016-09-01 DIAGNOSIS — D6851 Activated protein C resistance: Secondary | ICD-10-CM | POA: Insufficient documentation

## 2016-09-01 DIAGNOSIS — N943 Premenstrual tension syndrome: Secondary | ICD-10-CM | POA: Diagnosis not present

## 2016-09-01 DIAGNOSIS — N920 Excessive and frequent menstruation with regular cycle: Secondary | ICD-10-CM

## 2016-09-01 MED ORDER — NORETHINDRONE 0.35 MG PO TABS: 1.0000 | ORAL_TABLET | Freq: Every day | ORAL | 2 refills | Status: DC

## 2016-09-01 NOTE — Progress Notes (Signed)
GYNECOLOGY  VISIT   HPI: 40 y.o. G63P2002 Married Caucasian female here for discussion of hormonal symptoms and cycles.  Cycles are about every 24 to 25 days.  Flow lasts about 6-7 days.  First day is heavy and she's soaked through tampon and pad the last two months.  Reports she has about five good days each month due to the bleeding and to PMS symptoms she has before her cycle starts.    At first visit with pt, d/w her OCPs, Mirena IUD and endometrial ablation.  Pt recently had testing done for how she would do with certain mnedications.  This was done with Dr. Rachel Moulds who is helping to manage pt's ADD.  It was found that she is heterozygous for Factor V Leiden.  D/w pt mild increased risk of clotting.  Would prefer to not use estrogen containing OCPs with her, if possible.  GYNECOLOGIC HISTORY: Patient's last menstrual period was 08/24/2016. Contraception: BTL  Patient Active Problem List   Diagnosis Date Noted  . Hernia, rectovaginal 06/07/2012  . Adiposity 06/07/2012  . Calculus of kidney 06/07/2012  . Headache, menstrual migraine 06/07/2012  . Malignant melanoma ([place]) 06/07/2012  . Dysmenorrhea 06/07/2012    Past Medical History:  Diagnosis Date  . Anxiety   . Depression   . History of abnormal cervical Pap smear 1997   dysplasia- LEEP  . Migraine   . Ovarian cyst 1994  . Rectocele     Past Surgical History:  Procedure Laterality Date  . CERVICAL BIOPSY  W/ LOOP ELECTRODE EXCISION    . CESAREAN SECTION  2007/2010  . OVARIAN CYST SURGERY  1994  . TUBAL LIGATION  2010  . VULVA /PERINEUM BIOPSY  01/2015    MEDS:  Reviewed in EPIC and UTD  ALLERGIES: Review of patient's allergies indicates no known allergies.  No family history on file.  SH:  Married, non smoker  Review of Systems  All other systems reviewed and are negative.   PHYSICAL EXAMINATION:    BP 102/70 (BP Location: Right Arm, Patient Position: Sitting, Cuff Size: Normal)   Pulse 76   Resp 14    Ht 5\' 2"  (1.575 m)   Wt 173 lb 3.2 oz (78.6 kg)   LMP 08/24/2016   BMI 31.68 kg/m     General appearance: alert, cooperative and appears stated age No other exam performed  Chaperone was present for exam.  Assessment: Menorrhagia Shortened menstrual cycles at 24-25 days  PMS symptoms  Plan: Start miconor now.  Side effects reviewed.  May need to try other options if this doesn't work well for her.     ~15 minutes spent with patient >50% of time was in face to face discussion of above.

## 2016-10-09 ENCOUNTER — Ambulatory Visit: Payer: BLUE CROSS/BLUE SHIELD

## 2016-10-12 ENCOUNTER — Ambulatory Visit
Admission: RE | Admit: 2016-10-12 | Discharge: 2016-10-12 | Disposition: A | Discharge status: Home / Self Care | Payer: BLUE CROSS/BLUE SHIELD | Source: Ambulatory Visit | Attending: Obstetrics & Gynecology | Admitting: Obstetrics & Gynecology

## 2016-10-12 DIAGNOSIS — Z1231 Encounter for screening mammogram for malignant neoplasm of breast: Secondary | ICD-10-CM

## 2016-10-13 DIAGNOSIS — J019 Acute sinusitis, unspecified: Secondary | ICD-10-CM | POA: Diagnosis not present

## 2016-10-15 ENCOUNTER — Other Ambulatory Visit: Payer: Self-pay | Admitting: Obstetrics & Gynecology

## 2016-10-15 DIAGNOSIS — R928 Other abnormal and inconclusive findings on diagnostic imaging of breast: Secondary | ICD-10-CM

## 2016-10-21 ENCOUNTER — Ambulatory Visit
Admission: RE | Admit: 2016-10-21 | Discharge: 2016-10-21 | Disposition: A | Discharge status: Home / Self Care | Payer: BLUE CROSS/BLUE SHIELD | Source: Ambulatory Visit | Attending: Obstetrics & Gynecology | Admitting: Obstetrics & Gynecology

## 2016-10-21 DIAGNOSIS — R928 Other abnormal and inconclusive findings on diagnostic imaging of breast: Secondary | ICD-10-CM

## 2016-10-21 DIAGNOSIS — N6314 Unspecified lump in the right breast, lower inner quadrant: Secondary | ICD-10-CM | POA: Diagnosis not present

## 2016-11-11 DIAGNOSIS — Z23 Encounter for immunization: Secondary | ICD-10-CM | POA: Diagnosis not present

## 2016-11-12 DIAGNOSIS — Z79899 Other long term (current) drug therapy: Secondary | ICD-10-CM | POA: Diagnosis not present

## 2016-11-12 DIAGNOSIS — F900 Attention-deficit hyperactivity disorder, predominantly inattentive type: Secondary | ICD-10-CM | POA: Diagnosis not present

## 2016-11-12 DIAGNOSIS — F429 Obsessive-compulsive disorder, unspecified: Secondary | ICD-10-CM | POA: Diagnosis not present

## 2016-11-12 DIAGNOSIS — F338 Other recurrent depressive disorders: Secondary | ICD-10-CM | POA: Diagnosis not present

## 2016-12-22 DIAGNOSIS — M25562 Pain in left knee: Secondary | ICD-10-CM | POA: Diagnosis not present

## 2016-12-22 DIAGNOSIS — M25561 Pain in right knee: Secondary | ICD-10-CM | POA: Diagnosis not present

## 2016-12-30 DIAGNOSIS — M25561 Pain in right knee: Secondary | ICD-10-CM | POA: Diagnosis not present

## 2016-12-30 DIAGNOSIS — M25562 Pain in left knee: Secondary | ICD-10-CM | POA: Diagnosis not present

## 2017-01-07 DIAGNOSIS — M25561 Pain in right knee: Secondary | ICD-10-CM | POA: Diagnosis not present

## 2017-01-07 DIAGNOSIS — M25562 Pain in left knee: Secondary | ICD-10-CM | POA: Diagnosis not present

## 2017-01-26 DIAGNOSIS — L700 Acne vulgaris: Secondary | ICD-10-CM | POA: Diagnosis not present

## 2017-01-26 DIAGNOSIS — D2262 Melanocytic nevi of left upper limb, including shoulder: Secondary | ICD-10-CM | POA: Diagnosis not present

## 2017-01-26 DIAGNOSIS — Z85820 Personal history of malignant melanoma of skin: Secondary | ICD-10-CM | POA: Diagnosis not present

## 2017-01-26 DIAGNOSIS — L400 Psoriasis vulgaris: Secondary | ICD-10-CM | POA: Diagnosis not present

## 2017-02-09 DIAGNOSIS — F900 Attention-deficit hyperactivity disorder, predominantly inattentive type: Secondary | ICD-10-CM | POA: Diagnosis not present

## 2017-02-09 DIAGNOSIS — F429 Obsessive-compulsive disorder, unspecified: Secondary | ICD-10-CM | POA: Diagnosis not present

## 2017-02-09 DIAGNOSIS — Z79899 Other long term (current) drug therapy: Secondary | ICD-10-CM | POA: Diagnosis not present

## 2017-02-09 DIAGNOSIS — F902 Attention-deficit hyperactivity disorder, combined type: Secondary | ICD-10-CM | POA: Diagnosis not present

## 2017-02-15 NOTE — Progress Notes (Signed)
HPI:  Autumn [name] is here to establish care.  Last PCP and physical: 2 years ago with her Prior PCP. Now has been seeing her gyn for CPEs, [name].  Has the following chronic problems that require follow up and concerns today:  Migraines: -uses triptan prn -rare, hormonal triggers  ADHD: -has a history of chronic mild depression/anxiety; dx of bipolar d/o in the past - she does not think she has this -sees [name], [place] [facility] -taking a break from meds right now -was on Adzenys -sleep and GI issues are improving off medication -big challenge was moving from working to staying home with children - did counseling then -poor sleep -no severe depression or thoughts self harm, no manic symptoms  GI issues her whole issues: -flatulence, softer stools -her whole life -not a change -no: abd pain, nausea, vomiting, diarrhea, constipation -wants to test for celiacs  Hx of Melanoma/Psoriasis: -sees [name], Dermatologist for management  Weight: -admits diet is not great successful with ww in the past -exercising 4-5 days per week  ROS negative for unless reported above: fevers, unintentional weight loss, hearing or vision loss, chest pain, palpitations, struggling to breath, hemoptysis, melena, hematochezia, hematuria, falls, loc, si, thoughts of self harm  Past Medical History:  Diagnosis Date  . Anxiety   . Attention deficit hyperactivity disorder (ADHD) 02/16/2017   Sees [facility] for managment  . Calculus of kidney 06/07/2012   Overview:  Dr Karsten Ro - urology   . Depression   . Factor V Leiden carrier ([place])    heterozygous  . Hernia, rectovaginal 06/07/2012   Overview:  Dr Corinna Capra - gyn   . History of abnormal cervical Pap smear 1997   dysplasia- LEEP  . Migraine   . Ovarian cyst 1994  . Psoriasis 02/16/2017   -sees dermatologist  . Rectocele     Past Surgical History:  Procedure Laterality Date  . CERVICAL  BIOPSY  W/ LOOP ELECTRODE EXCISION    . CESAREAN SECTION  2007/2010  . OVARIAN CYST SURGERY  1994  . TUBAL LIGATION  2010  . VULVA /PERINEUM BIOPSY  01/2015    No family history on file.  Social History   Social History  . Marital status: Married    Spouse name: N/A  . Number of children: N/A  . Years of education: N/A   Social History Main Topics  . Smoking status: Never Smoker  . Smokeless tobacco: Never Used  . Alcohol use 3.0 oz/week    5 Standard drinks or equivalent per week  . Drug use: No  . Sexual activity: Yes    Partners: Male    Birth control/ protection: Surgical   Other Topics Concern  . Not on file   Social History Narrative   Work or School: homemaker      Home Situation: 2 children      Spiritual Beliefs:      Lifestyle: regular exercise        Current Outpatient Prescriptions:  .  BEPREVE 1.5 % SOLN, , Disp: , Rfl: 5 .  clindamycin (CLEOCIN T) 1 % lotion, Apply topically daily., Disp: , Rfl:  .  clobetasol (TEMOVATE) 0.05 % external solution, , Disp: , Rfl: 1 .  naproxen (NAPROSYN) 250 MG tablet, Take 250 mg by mouth as needed., Disp: , Rfl:  .  rizatriptan (MAXALT) 10 MG tablet, Take 10 mg by mouth as needed for migraine. May repeat in 2 hours if needed, Disp: ,  Rfl:   EXAM:  Vitals:   02/16/17 1101  BP: 102/72  Pulse: 77  Temp: 98 F (36.7 C)    Body mass index is 33.91 kg/m.   GENERAL: vitals reviewed and listed above, alert, oriented, appears well hydrated and in no acute distress  HEENT: atraumatic, conjunttiva clear, no obvious abnormalities on inspection of external nose and ears  NECK: no obvious masses on inspection  LUNGS: clear to auscultation bilaterally, no wheezes, rales or rhonchi, good air movement  CV: HRRR, no peripheral edema  MS: moves all extremities without noticeable abnormality  PSYCH: pleasant and cooperative, no obvious depression or anxiety  ASSESSMENT AND PLAN:  Discussed the following  assessment and plan: More than 50% of over 45 minutes spent in total in caring for this patient was spent face-to-face with the patient, counseling and/or coordinating care.    Encounter to establish care with new doctor  Attention deficit hyperactivity disorder (ADHD), unspecified ADHD type - Sees [facility] for managment  Mild episode of recurrent major depressive disorder ([place])  Insomnia, unspecified type  Other migraine without status migrainosus, not intractable  Flatulence  BMI 33.0-33.9,adult  Hx of malignant melanoma - -sees [name], dermatology  Psoriasis - -sees dermatologist -We reviewed the PMH, PSH, FH, SH, Meds and Allergies. -discussed her chronic health issues at length and discussed various ways to approach, evaluate further and treat -She is interested in pressuring CBT and brochure provided - she would prefer treating without medications as much as possible -she is interested in pursuing healthier diet and advised consideration Mediterranean diet and or wt watchers -at physical consider the following labs: celiac panel, tsh, cbc, bmp, vit D, b12 -follow up for CPE  -Patient advised to return or notify a doctor immediately if symptoms worsen or persist or new concerns arise.  Patient Instructions  BEFORE YOU LEAVE: -follow up: 3 months; come fasting if convenient  It was really nice to meet you today  Call to set up cognitive behavioral therapy.  Make sure eating several slices of bread daily.  Consider weight watchers.  We recommend the following healthy lifestyle for LIFE: 1) Small portions.   Tip: eat off of a salad plate instead of a dinner plate.  Tip: if you need more or a snack choose fruits, veggies and/or a handful of nuts or seeds.  2) Eat a healthy clean diet.  * Tip: Avoid (less then 1 serving per week): processed foods, sweets, sweetened drinks, white starches (rice, flour, bread, potatoes, pasta, etc), red meat,  fast foods, butter  *Tip: CHOOSE instead   * 5-9 servings per day of fresh or frozen fruits and vegetables (but not corn, potatoes, bananas, canned or dried fruit)   *nuts and seeds, beans   *olives and olive oil   *small portions of lean meats such as fish and white chicken    *small portions of whole grains  3)Get at least 150 minutes of sweaty aerobic exercise per week.  4)Reduce stress - consider counseling, meditation and relaxation to balance other aspects of your life.  WE NOW OFFER   [facility] FAST TRACK!!!  SAME DAY Appointments for ACUTE CARE  Such as: Sprains, Injuries, cuts, abrasions, rashes, muscle pain, joint pain, back pain Colds, flu, sore throats, headache, allergies, cough, fever  Ear pain, sinus and eye infections Abdominal pain, nausea, vomiting, diarrhea, upset stomach Animal/insect bites  3 Easy Ways to Schedule: Walk-In Scheduling Call in scheduling Mychart Sign-up: [email or web address]  Colin Benton R.

## 2017-02-16 ENCOUNTER — Ambulatory Visit (INDEPENDENT_AMBULATORY_CARE_PROVIDER_SITE_OTHER): Payer: BLUE CROSS/BLUE SHIELD | Admitting: Family Medicine

## 2017-02-16 ENCOUNTER — Encounter: Payer: Self-pay | Admitting: Family Medicine

## 2017-02-16 VITALS — BP 102/72 | HR 77 | Temp 98.0°F | Ht 60.75 in | Wt 178.0 lb

## 2017-02-16 DIAGNOSIS — G4700 Insomnia, unspecified: Secondary | ICD-10-CM

## 2017-02-16 DIAGNOSIS — G43809 Other migraine, not intractable, without status migrainosus: Secondary | ICD-10-CM

## 2017-02-16 DIAGNOSIS — F909 Attention-deficit hyperactivity disorder, unspecified type: Secondary | ICD-10-CM | POA: Diagnosis not present

## 2017-02-16 DIAGNOSIS — L409 Psoriasis, unspecified: Secondary | ICD-10-CM

## 2017-02-16 DIAGNOSIS — Z7689 Persons encountering health services in other specified circumstances: Secondary | ICD-10-CM

## 2017-02-16 DIAGNOSIS — R143 Flatulence: Secondary | ICD-10-CM | POA: Insufficient documentation

## 2017-02-16 DIAGNOSIS — F330 Major depressive disorder, recurrent, mild: Secondary | ICD-10-CM

## 2017-02-16 DIAGNOSIS — Z85820 Personal history of malignant melanoma of skin: Secondary | ICD-10-CM

## 2017-02-16 DIAGNOSIS — Z6833 Body mass index (BMI) 33.0-33.9, adult: Secondary | ICD-10-CM

## 2017-02-16 HISTORY — DX: Attention-deficit hyperactivity disorder, unspecified type: F90.9

## 2017-02-16 HISTORY — DX: Psoriasis, unspecified: L40.9

## 2017-02-16 NOTE — Progress Notes (Signed)
Pre visit review using our clinic review tool, if applicable. No additional management support is needed unless otherwise documented below in the visit note. 

## 2017-02-16 NOTE — Patient Instructions (Addendum)
BEFORE YOU LEAVE: -follow up: 3 months; come fasting if convenient  It was really nice to meet you today  Call to set up cognitive behavioral therapy.  Make sure eating several slices of bread daily.  Consider weight watchers.  We recommend the following healthy lifestyle for LIFE: 1) Small portions.   Tip: eat off of a salad plate instead of a dinner plate.  Tip: if you need more or a snack choose fruits, veggies and/or a handful of nuts or seeds.  2) Eat a healthy clean diet.  * Tip: Avoid (less then 1 serving per week): processed foods, sweets, sweetened drinks, white starches (rice, flour, bread, potatoes, pasta, etc), red meat, fast foods, butter  *Tip: CHOOSE instead   * 5-9 servings per day of fresh or frozen fruits and vegetables (but not corn, potatoes, bananas, canned or dried fruit)   *nuts and seeds, beans   *olives and olive oil   *small portions of lean meats such as fish and white chicken    *small portions of whole grains  3)Get at least 150 minutes of sweaty aerobic exercise per week.  4)Reduce stress - consider counseling, meditation and relaxation to balance other aspects of your life.  WE NOW OFFER   [facility] FAST TRACK!!!  SAME DAY Appointments for ACUTE CARE  Such as: Sprains, Injuries, cuts, abrasions, rashes, muscle pain, joint pain, back pain Colds, flu, sore throats, headache, allergies, cough, fever  Ear pain, sinus and eye infections Abdominal pain, nausea, vomiting, diarrhea, upset stomach Animal/insect bites  3 Easy Ways to Schedule: Walk-In Scheduling Call in scheduling Mychart Sign-up: [email or web address]

## 2017-02-19 ENCOUNTER — Telehealth: Payer: Self-pay | Admitting: Obstetrics & Gynecology

## 2017-02-19 ENCOUNTER — Other Ambulatory Visit: Payer: Self-pay | Admitting: Obstetrics & Gynecology

## 2017-02-19 MED ORDER — SCOPOLAMINE 1 MG/3DAYS TD PT72: 1.0000 | MEDICATED_PATCH | TRANSDERMAL | 1 refills | Status: DC

## 2017-02-19 NOTE — Telephone Encounter (Signed)
Spoke with patient, advised as seen below per [name]. Patient thankful for return call. Patient verbalizes understanding and is agreeable.

## 2017-02-19 NOTE — Telephone Encounter (Signed)
Patient would like to know if Dr Sabra Heck will send a prescription for an anti-nausea medication because she is going on a cruise. [employer] at [phone].

## 2017-02-19 NOTE — Telephone Encounter (Signed)
Usually, scopolamine patches are what is used for nausea for a cruise.  I sent a prescription to her pharmacy for this.

## 2017-02-19 NOTE — Telephone Encounter (Signed)
[name], please review patient message below and advise?

## 2017-03-04 DIAGNOSIS — R110 Nausea: Secondary | ICD-10-CM | POA: Diagnosis not present

## 2017-03-04 DIAGNOSIS — R194 Change in bowel habit: Secondary | ICD-10-CM | POA: Diagnosis not present

## 2017-03-04 DIAGNOSIS — E669 Obesity, unspecified: Secondary | ICD-10-CM | POA: Diagnosis not present

## 2017-03-04 DIAGNOSIS — R140 Abdominal distension (gaseous): Secondary | ICD-10-CM | POA: Diagnosis not present

## 2017-03-18 DIAGNOSIS — R140 Abdominal distension (gaseous): Secondary | ICD-10-CM | POA: Diagnosis not present

## 2017-03-18 DIAGNOSIS — R194 Change in bowel habit: Secondary | ICD-10-CM | POA: Diagnosis not present

## 2017-03-18 DIAGNOSIS — Z800 Family history of malignant neoplasm of digestive organs: Secondary | ICD-10-CM | POA: Diagnosis not present

## 2017-04-09 DIAGNOSIS — H5213 Myopia, bilateral: Secondary | ICD-10-CM | POA: Diagnosis not present

## 2017-04-09 DIAGNOSIS — H04123 Dry eye syndrome of bilateral lacrimal glands: Secondary | ICD-10-CM | POA: Diagnosis not present

## 2017-04-16 DIAGNOSIS — Z136 Encounter for screening for cardiovascular disorders: Secondary | ICD-10-CM | POA: Diagnosis not present

## 2017-04-16 DIAGNOSIS — Z0000 Encounter for general adult medical examination without abnormal findings: Secondary | ICD-10-CM | POA: Diagnosis not present

## 2017-04-17 ENCOUNTER — Encounter: Payer: Self-pay | Admitting: Obstetrics & Gynecology

## 2017-04-20 DIAGNOSIS — F900 Attention-deficit hyperactivity disorder, predominantly inattentive type: Secondary | ICD-10-CM | POA: Diagnosis not present

## 2017-04-20 DIAGNOSIS — Z23 Encounter for immunization: Secondary | ICD-10-CM | POA: Diagnosis not present

## 2017-04-20 DIAGNOSIS — Z0000 Encounter for general adult medical examination without abnormal findings: Secondary | ICD-10-CM | POA: Diagnosis not present

## 2017-04-20 DIAGNOSIS — F329 Major depressive disorder, single episode, unspecified: Secondary | ICD-10-CM | POA: Diagnosis not present

## 2017-04-20 DIAGNOSIS — R5383 Other fatigue: Secondary | ICD-10-CM | POA: Diagnosis not present

## 2017-04-20 DIAGNOSIS — Z6833 Body mass index (BMI) 33.0-33.9, adult: Secondary | ICD-10-CM | POA: Diagnosis not present

## 2017-04-29 ENCOUNTER — Ambulatory Visit (INDEPENDENT_AMBULATORY_CARE_PROVIDER_SITE_OTHER): Payer: BLUE CROSS/BLUE SHIELD | Admitting: Obstetrics & Gynecology

## 2017-04-29 ENCOUNTER — Encounter: Payer: Self-pay | Admitting: Obstetrics & Gynecology

## 2017-04-29 VITALS — BP 100/70 | HR 80 | Resp 16 | Ht 61.75 in | Wt 180.0 lb

## 2017-04-29 DIAGNOSIS — Z01419 Encounter for gynecological examination (general) (routine) without abnormal findings: Secondary | ICD-10-CM | POA: Diagnosis not present

## 2017-04-29 NOTE — Progress Notes (Addendum)
41 y.o. [id number] MarriedCaucasianF here for annual exam.  Doing well.  Cycles are between 23 - 28 days.  Not completely regular.  Cycles are heavier and crampier.     PCP:  Dr. Ernie Hew.  Having Hep A and Hep B vaccinations with her.  Lab work was done earlier this year.  This was all normal and she reports her cholesterol was better.    Using a stimulant for ADD.  This causes increased diarrhea and gas so takes a brake from time to time.  Has appt in June.  Feels better on a stimulant than off.    Did see Dr. Collene Mares.  Had some testing done and this was negative.  She was advised to have a colonoscopy at age 64.   Patient's last menstrual period was 04/16/2017.          Sexually active: Yes.    The current method of family planning is tubal ligation.    Exercising: Yes.    cardio, strength training  Smoker:  no  Health Maintenance: Pap:  01/14/16 Neg. HR HPV:neg  12/18/14 Neg  History of abnormal Pap:  Yes, Hx of LEEP  MMG:  10/21/16 BIRADS2:Benign  Colonoscopy:  Never BMD:   Never TDaP:  09/2012 Pneumonia vaccine(s):  No Zostavax:   N/A Hep C testing: N/A Screening Labs: PCP   reports that she has never smoked. She has never used smokeless tobacco. She reports that she drinks about 3.0 oz of alcohol per week . She reports that she does not use drugs.  Past Medical History:  Diagnosis Date  . Anxiety   . Attention deficit hyperactivity disorder (ADHD) 02/16/2017   Sees [facility] for managment  . Calculus of kidney 06/07/2012   Overview:  Dr Karsten Ro - urology   . Depression   . Factor V Leiden carrier ([place])    heterozygous  . Hernia, rectovaginal 06/07/2012   Overview:  Dr Corinna Capra - gyn   . History of abnormal cervical Pap smear 1997   dysplasia- LEEP  . Migraine   . Ovarian cyst 1994  . Psoriasis 02/16/2017   -sees dermatologist  . Rectocele     Past Surgical History:  Procedure Laterality Date  . CERVICAL BIOPSY  W/ LOOP ELECTRODE EXCISION    . CESAREAN  SECTION  2007/2010  . OVARIAN CYST SURGERY  1994  . TUBAL LIGATION  2010  . VULVA /PERINEUM BIOPSY  01/2015    Current Outpatient Prescriptions  Medication Sig Dispense Refill  . ADZENYS XR-ODT 9.4 MG TBED Take 1 tablet by mouth daily.  0  . BEPREVE 1.5 % SOLN   5  . clindamycin (CLEOCIN T) 1 % lotion Apply topically daily.    . clobetasol (TEMOVATE) 0.05 % external solution   1  . Multiple Vitamin (MULTIVITAMIN) tablet Take 1 tablet by mouth daily.    . naproxen (NAPROSYN) 250 MG tablet Take 250 mg by mouth as needed.    . Probiotic Product (PROBIOTIC DAILY) CAPS Take by mouth daily.    . rizatriptan (MAXALT) 10 MG tablet Take 10 mg by mouth as needed for migraine. May repeat in 2 hours if needed     No current facility-administered medications for this visit.     Family History  Problem Relation Age of Onset  . Esophageal cancer Father   . Esophageal cancer Maternal Uncle   . Rectal cancer Maternal Uncle   . Diabetes Maternal Grandmother   . Heart disease Maternal Grandmother  ROS:  Pertinent items are noted in HPI.  Otherwise, a comprehensive ROS was negative.  Exam:   BP 100/70 (BP Location: Right Arm, Patient Position: Sitting, Cuff Size: Large)   Pulse 80   Resp 16   Ht 5' 1.75" (1.568 m)   Wt 180 lb (81.6 kg)   LMP 04/16/2017   BMI 33.19 kg/m   Weight change: +15#  Height: 5' 1.75" (156.8 cm)  Ht Readings from Last 3 Encounters:  04/29/17 5' 1.75" (1.568 m)  02/16/17 5' 0.75" (1.543 m)  09/01/16 5\' 2"  (1.575 m)    General appearance: alert, cooperative and appears stated age Head: Normocephalic, without obvious abnormality, atraumatic Neck: no adenopathy, supple, symmetrical, trachea midline and thyroid normal to inspection and palpation Lungs: clear to auscultation bilaterally Breasts: normal appearance, no masses or tenderness Heart: regular rate and rhythm Abdomen: soft, non-tender; bowel sounds normal; no masses,  no organomegaly Extremities:  extremities normal, atraumatic, no cyanosis or edema Skin: Skin color, texture, turgor normal. No rashes or lesions Lymph nodes: Cervical, supraclavicular, and axillary nodes normal. No abnormal inguinal nodes palpated Neurologic: Grossly normal   Pelvic: External genitalia:  no lesions              Urethra:  normal appearing urethra with no masses, tenderness or lesions              Bartholins and Skenes: normal                 Vagina: normal appearing vagina with normal color and discharge, no lesions              Cervix: no lesions              Pap taken: No. Bimanual Exam:  Uterus:  normal size, contour, position, consistency, mobility, non-tender              Adnexa: normal adnexa and no mass, fullness, tenderness               Rectovaginal: Confirms               Anus:  normal sphincter tone, no lesions  Chaperone was present for exam.  A:  Well Woman with normal exam ADD, seeing Dr. Rachel Moulds H/O melanoma, seeing [name] H/O LEEP [phone] Mild depression  P:   Mammogram guidelines reviewed pap smear and HR HPV 2017, no pap obtained today Continues to see [name] for ADD Still considering IUD vs endometrial ablation. D/w pt taking Cymbalta 20mg  dosage return annually or prn

## 2017-05-13 DIAGNOSIS — F1920 Other psychoactive substance dependence, uncomplicated: Secondary | ICD-10-CM | POA: Diagnosis not present

## 2017-05-13 DIAGNOSIS — F900 Attention-deficit hyperactivity disorder, predominantly inattentive type: Secondary | ICD-10-CM | POA: Diagnosis not present

## 2017-05-13 DIAGNOSIS — F338 Other recurrent depressive disorders: Secondary | ICD-10-CM | POA: Diagnosis not present

## 2017-05-13 DIAGNOSIS — F429 Obsessive-compulsive disorder, unspecified: Secondary | ICD-10-CM | POA: Diagnosis not present

## 2017-05-13 DIAGNOSIS — F902 Attention-deficit hyperactivity disorder, combined type: Secondary | ICD-10-CM | POA: Diagnosis not present

## 2017-05-13 DIAGNOSIS — M722 Plantar fascial fibromatosis: Secondary | ICD-10-CM | POA: Diagnosis not present

## 2017-05-13 DIAGNOSIS — Z79899 Other long term (current) drug therapy: Secondary | ICD-10-CM | POA: Diagnosis not present

## 2017-05-13 DIAGNOSIS — M542 Cervicalgia: Secondary | ICD-10-CM | POA: Diagnosis not present

## 2017-05-13 DIAGNOSIS — M7631 Iliotibial band syndrome, right leg: Secondary | ICD-10-CM | POA: Diagnosis not present

## 2017-05-27 ENCOUNTER — Ambulatory Visit: Payer: BLUE CROSS/BLUE SHIELD | Admitting: Family Medicine

## 2017-06-17 ENCOUNTER — Ambulatory Visit: Payer: BLUE CROSS/BLUE SHIELD | Admitting: Family Medicine

## 2017-06-22 DIAGNOSIS — F331 Major depressive disorder, recurrent, moderate: Secondary | ICD-10-CM | POA: Diagnosis not present

## 2017-06-22 DIAGNOSIS — Z6833 Body mass index (BMI) 33.0-33.9, adult: Secondary | ICD-10-CM | POA: Diagnosis not present

## 2017-06-22 DIAGNOSIS — Z23 Encounter for immunization: Secondary | ICD-10-CM | POA: Diagnosis not present

## 2017-06-22 DIAGNOSIS — F3281 Premenstrual dysphoric disorder: Secondary | ICD-10-CM | POA: Diagnosis not present

## 2017-06-30 DIAGNOSIS — M25571 Pain in right ankle and joints of right foot: Secondary | ICD-10-CM | POA: Diagnosis not present

## 2017-07-06 ENCOUNTER — Emergency Department (HOSPITAL_COMMUNITY)
Admission: EM | Admit: 2017-07-06 | Discharge: 2017-07-06 | Disposition: A | Discharge status: Home / Self Care | Payer: BLUE CROSS/BLUE SHIELD | Attending: Emergency Medicine | Admitting: Emergency Medicine

## 2017-07-06 ENCOUNTER — Encounter (HOSPITAL_COMMUNITY): Payer: Self-pay

## 2017-07-06 DIAGNOSIS — Y999 Unspecified external cause status: Secondary | ICD-10-CM | POA: Insufficient documentation

## 2017-07-06 DIAGNOSIS — Z79899 Other long term (current) drug therapy: Secondary | ICD-10-CM | POA: Diagnosis not present

## 2017-07-06 DIAGNOSIS — Y92009 Unspecified place in unspecified non-institutional (private) residence as the place of occurrence of the external cause: Secondary | ICD-10-CM | POA: Insufficient documentation

## 2017-07-06 DIAGNOSIS — W260XXA Contact with knife, initial encounter: Secondary | ICD-10-CM | POA: Insufficient documentation

## 2017-07-06 DIAGNOSIS — S61215A Laceration without foreign body of left ring finger without damage to nail, initial encounter: Secondary | ICD-10-CM | POA: Diagnosis not present

## 2017-07-06 DIAGNOSIS — Y93G1 Activity, food preparation and clean up: Secondary | ICD-10-CM | POA: Diagnosis not present

## 2017-07-06 NOTE — ED Triage Notes (Signed)
Patient was cutting an avocado and cut herself between her 4th and 5th finger on L hand. She started to feel light headed and 5th finger felt numb/tingling so she came to get evaluated.

## 2017-07-06 NOTE — ED Provider Notes (Signed)
[facility] DEPT Provider Note   CSN: [id number] Arrival date & time: 07/06/17  [facility]     History   Chief Complaint Chief Complaint  Patient presents with  . Laceration    HPI Autumn [name] is a 41 y.o. female.  The history is provided by the patient and medical records.     41 y.o. F with history of anxiety, ADHD, depression, ovarian cyst, psoriasis, presenting to the ED for laceration. Patient states she was preparing dinner and cutting and avocado when the knife slipped and accidentally cut her between her fourth and fifth digits of left hand. States initially was bleeding but helped with direct pressure and holding her hand above her head.  States she did feel a bit faint from seeing the blood but that has resolved now.  Tetanus vaccine is up-to-date.  Past Medical History:  Diagnosis Date  . Anxiety   . Attention deficit hyperactivity disorder (ADHD) 02/16/2017   Sees [facility] for managment  . Calculus of kidney 06/07/2012   Overview:  Dr Karsten Ro - urology   . Depression   . Factor V Leiden carrier ([place])    heterozygous  . Hernia, rectovaginal 06/07/2012   Overview:  Dr Corinna Capra - gyn   . History of abnormal cervical Pap smear 1997   dysplasia- LEEP  . Migraine   . Ovarian cyst 1994  . Psoriasis 02/16/2017   -sees dermatologist  . Rectocele     Patient Active Problem List   Diagnosis Date Noted  . Mild episode of recurrent major depressive disorder ([place]) 02/16/2017  . Attention deficit hyperactivity disorder (ADHD) 02/16/2017  . Flatulence 02/16/2017  . BMI 33.0-33.9,adult 02/16/2017  . Psoriasis 02/16/2017  . Heterozygous factor V Leiden mutation ([place]) 09/01/2016  . Headache, menstrual migraine 06/07/2012  . Malignant melanoma ([place]) 06/07/2012  . Dysmenorrhea 06/07/2012    Past Surgical History:  Procedure Laterality Date  . CERVICAL BIOPSY  W/ LOOP ELECTRODE EXCISION    . CESAREAN SECTION  2007/2010  . OVARIAN CYST SURGERY   1994  . TUBAL LIGATION  2010  . VULVA /PERINEUM BIOPSY  01/2015    OB History    Gravida Para Term Preterm AB Living   2 2 2  0 0 2   SAB TAB Ectopic Multiple Live Births   0 0 0 0 2       Home Medications    Prior to Admission medications   Medication Sig Start Date End Date Taking? Authorizing Provider  ADZENYS XR-ODT 9.4 MG TBED Take 1 tablet by mouth daily. 04/27/17   [provider]  BEPREVE 1.5 % SOLN  06/13/16   [provider]  clindamycin (CLEOCIN T) 1 % lotion Apply topically daily.    [provider]  clobetasol (TEMOVATE) 0.05 % external solution  12/11/15   [provider]  Multiple Vitamin (MULTIVITAMIN) tablet Take 1 tablet by mouth daily.    [provider]  naproxen (NAPROSYN) 250 MG tablet Take 250 mg by mouth as needed.    [provider]  Probiotic Product (PROBIOTIC DAILY) CAPS Take by mouth daily. 06/07/12   [provider]  rizatriptan (MAXALT) 10 MG tablet Take 10 mg by mouth as needed for migraine. May repeat in 2 hours if needed    [provider]    Family History Family History  Problem Relation Age of Onset  . Esophageal cancer Father   . Esophageal cancer Maternal Uncle   . Rectal cancer Maternal Uncle   .  Diabetes Maternal Grandmother   . Heart disease Maternal Grandmother     Social History Social History  Substance Use Topics  . Smoking status: Never Smoker  . Smokeless tobacco: Never Used  . Alcohol use 3.0 oz/week    5 Standard drinks or equivalent per week     Allergies   Patient has no known allergies.   Review of Systems Review of Systems  Skin: Positive for wound.  All other systems reviewed and are negative.    Physical Exam Updated Vital Signs BP 97/64 (BP Location: Right Arm)   Pulse 67   Temp 98.6 F (37 C) (Oral)   Resp 18   Ht 5\' 2"  (1.575 m)   Wt 84.8 kg (187 lb)   SpO2 100%   BMI 34.20 kg/m   Physical Exam  Constitutional: She is  oriented to person, place, and time. She appears well-developed and well-nourished.  HENT:  Head: Normocephalic and atraumatic.  Mouth/Throat: Oropharynx is clear and moist.  Eyes: Pupils are equal, round, and reactive to light. Conjunctivae and EOM are normal.  Neck: Normal range of motion.  Cardiovascular: Normal rate, regular rhythm and normal heart sounds.   Pulmonary/Chest: Effort normal and breath sounds normal.  Abdominal: Soft. Bowel sounds are normal.  Musculoskeletal: Normal range of motion.  1cm superficial laceration to ulnar aspect of left 4th digit, proximal phalanx; there is no active bleeding; no deep tissue, vessel, or tendon involvement; normal flexion/extension  Neurological: She is alert and oriented to person, place, and time.  Skin: Skin is warm and dry.  Psychiatric: She has a normal mood and affect.  Nursing note and vitals reviewed.    ED Treatments / Results  Labs (all labs ordered are listed, but only abnormal results are displayed) Labs Reviewed - No data to display  EKG  EKG Interpretation None       Radiology No results found.  Procedures Procedures (including critical care time)  LACERATION REPAIR Performed by: Larene Pickett Authorized by: Larene Pickett Consent: Verbal consent obtained. Risks and benefits: risks, benefits and alternatives were discussed Consent given by: patient Patient identity confirmed: provided demographic data Prepped and Draped in normal sterile fashion Wound explored  Laceration Location: left 4th digit, ulnar aspect, proximal phalanx  Laceration Length: 1cm, superficial  No Foreign Bodies seen or palpated  Anesthesia: none  Local anesthetic: none  Anesthetic total: 0 ml  Irrigation method: syringe Amount of cleaning: standard  Skin closure: dermabond  Number of sutures: 0  Technique: n/a  Patient tolerance: Patient tolerated the procedure well with no immediate  complications.   Medications Ordered in ED Medications - No data to display   Initial Impression / Assessment and Plan / ED Course  I have reviewed the triage vital signs and the nursing notes.  Pertinent labs & imaging results that were available during my care of the patient were reviewed by me and considered in my medical decision making (see chart for details).  41 year old female here with very minor laceration to left fourth digit just prior to arrival. Bleeding is controlled. Wound is superficial without evidence of deep tissue, vessel, or tendon involvement. She has full flexion and extension of affected finger. Normal sensation and cap refill. Her tetanus vaccine is up-to-date. Given location and superficial nature, laceration was repaired with Dermabond, patient tolerated well. Good approximation of skin tissue.She appears stable for discharge. Discussed home wound care. Can follow-up with PCP if any ongoing issues.  Discussed plan with patient,  she acknowledged understanding and agreed with plan of care.  Return precautions given for new or worsening symptoms.  Final Clinical Impressions(s) / ED Diagnoses   Final diagnoses:  Laceration of left ring finger without foreign body without damage to nail, initial encounter    New Prescriptions New Prescriptions   No medications on file     Kathryne Hitch 07/06/17 1953    Carmin Muskrat, MD 07/07/17 [phone]

## 2017-07-06 NOTE — Discharge Instructions (Signed)
Dermabond will slough off over the next few days, try not to pull it off. Follow-up with your primary care doctor. Return here for any new/worsening symptoms.

## 2017-07-31 NOTE — Progress Notes (Signed)
[name] [address], [place] [address] Phone: [phone] Subjective:    I'm seeing this patient by the request  of:  Fanny Bien, MD   CC: Foot pain  [id number]  Autumn [name] is a 41 y.o. female coming in with complaint of foot pain. Patient has had this pain for multiple years. Patient states overall working down for some time and started having worsening symptoms. Son of the provider was told that is plantar fascial. Given some exercises with no improvement. Patient has discontinued working out regularly and continues to have pain. States it's mostly on the medial aspect of the foot.Worse with activity and better with rest. No swelling. No numbness. Rates the severity pain is 6 out of 10.  Patient also had an injury into her left ring finger. Was seen in the emergency room. Included together. Patient states that unfortunately since then though she has had chronic numbness and is unable to flex her pinky finger. Does not seem to be improving at this time. Has follow-up with a hand specialist in October but was not able to be seen prior to that.   Past Medical History:  Diagnosis Date  . Anxiety   . Attention deficit hyperactivity disorder (ADHD) 02/16/2017   Sees [facility] for managment  . Calculus of kidney 06/07/2012   Overview:  Dr Karsten Ro - urology   . Depression   . Factor V Leiden carrier ([place])    heterozygous  . Hernia, rectovaginal 06/07/2012   Overview:  Dr Corinna Capra - gyn   . History of abnormal cervical Pap smear 1997   dysplasia- LEEP  . Migraine   . Ovarian cyst 1994  . Psoriasis 02/16/2017   -sees dermatologist  . Rectocele    Past Surgical History:  Procedure Laterality Date  . CERVICAL BIOPSY  W/ LOOP ELECTRODE EXCISION    . CESAREAN SECTION  2007/2010  . OVARIAN CYST SURGERY  1994  . TUBAL LIGATION  2010  . VULVA /PERINEUM BIOPSY  01/2015   Social History   Social History  .  Marital status: Married    Spouse name: N/A  . Number of children: N/A  . Years of education: N/A   Social History Main Topics  . Smoking status: Never Smoker  . Smokeless tobacco: Never Used  . Alcohol use 3.0 oz/week    5 Standard drinks or equivalent per week  . Drug use: No  . Sexual activity: Yes    Partners: Male    Birth control/ protection: Surgical   Other Topics Concern  . Not on file   Social History Narrative   Work or School: homemaker      Home Situation: 2 children      Spiritual Beliefs:      Lifestyle: regular exercise      No Known Allergies Family History  Problem Relation Age of Onset  . Esophageal cancer Father   . Esophageal cancer Maternal Uncle   . Rectal cancer Maternal Uncle   . Diabetes Maternal Grandmother   . Heart disease Maternal Grandmother      Past medical history, social, surgical and family history all reviewed in electronic medical record.  No pertanent information unless stated regarding to the chief complaint.   Review of Systems:Review of systems updated and as accurate as of 07/31/17  No headache, visual changes, nausea, vomiting, diarrhea, constipation, dizziness, abdominal pain, skin rash, fevers, chills, night sweats, weight loss, swollen lymph nodes,  body aches, joint swelling, muscle aches, chest pain, shortness of breath, mood changes.   Objective  There were no vitals taken for this visit. Systems examined below as of 07/31/17   General: No apparent distress alert and oriented x3 mood and affect normal, dressed appropriately.  HEENT: Pupils equal, extraocular movements intact  Respiratory: Patient's speak in full sentences and does not appear short of breath  Cardiovascular: No lower extremity edema, non tender, no erythema  Skin: Warm dry intact with no signs of infection or rash on extremities or on axial skeleton.  Abdomen: Soft nontender  Neuro: Cranial nerves II through XII are intact, neurovascularly intact in  all extremities with 2+ DTRs and 2+ pulses.  Lymph: No lymphadenopathy of posterior or anterior cervical chain or axillae bilaterally.  Gait normal with good balance and coordination.  MSK:  Non tender with full range of motion and good stability and symmetric strength and tone of shoulders, elbows, wrist, hip, knee bilaterally.  Left hand exam shows the patient does not have any control over the flexion of the small finger. Does have extension. Neurovascularly patient show some numbness as well.  Ankle: Right No visible erythema or swelling. Range of motion is full in all directions. Strength is 5/5 in all directions. Stable lateral and medial ligaments; squeeze test and kleiger test unremarkable; Talar dome laterally tender; No pain at base of 5th MT; No tenderness over cuboid; No tenderness over N spot or navicular prominence No tenderness on posterior aspects of lateral and medial malleolus does have pain of the navicular bone. No sign of peroneal tendon subluxations or tenderness to palpation Negative tarsal tunnel tinel's Able to walk 4 steps. Contralateral foot unremarkable  MSK US performed of: Right This study was ordered, performed, and interpreted by Charlann Boxer D.O.  Foot/Ankle:   All structures visualized.   Talar dome unremarkable  Ankle mortise without effusion. Patient does have abnormality of the navicular bone. Patient does have what appears to be a callus formation but not full. Mild increase in Doppler flow noted. No hypoechoic changes.  IMPRESSION:  Impression: Slow healing navicular bone.  [id number]; 15 additional minutes spent for Therapeutic exercises as stated in above notes.  This included exercises focusing on stretching, strengthening, with significant focus on eccentric aspects.   Long term goals include an improvement in range of motion, strength, endurance as well as avoiding reinjury. Patient's frequency would include in 1-2 times a day, 3-5 times a week for  a duration of 6-12 weeks. Ankle strengthening that included:  Basic range of motion exercises to allow proper full motion at ankle Stretching of the lower leg and hamstrings  Theraband exercises for the lower leg - inversion, eversion, dorsiflexion and plantarflexion each to be completed with a theraband which was given  Balance exercises to increase proprioception Weight bearing exercises to increase strength and balance   Proper technique shown and discussed handout in great detail with ATC.  All questions were discussed and answered.       Impression and Recommendations:     This case required medical decision making of moderate complexity.      Note: This dictation was prepared with Dragon dictation along with smaller phrase technology. Any transcriptional errors that result from this process are unintentional.

## 2017-08-02 ENCOUNTER — Ambulatory Visit (INDEPENDENT_AMBULATORY_CARE_PROVIDER_SITE_OTHER): Payer: BLUE CROSS/BLUE SHIELD | Admitting: Family Medicine

## 2017-08-02 ENCOUNTER — Encounter: Payer: Self-pay | Admitting: Family Medicine

## 2017-08-02 ENCOUNTER — Ambulatory Visit: Payer: Self-pay

## 2017-08-02 VITALS — BP 110/78 | HR 82 | Ht 60.75 in | Wt 183.0 lb

## 2017-08-02 DIAGNOSIS — S64495A Injury of digital nerve of left ring finger, initial encounter: Secondary | ICD-10-CM | POA: Diagnosis not present

## 2017-08-02 DIAGNOSIS — M79671 Pain in right foot: Secondary | ICD-10-CM

## 2017-08-02 DIAGNOSIS — S92251A Displaced fracture of navicular [scaphoid] of right foot, initial encounter for closed fracture: Secondary | ICD-10-CM | POA: Insufficient documentation

## 2017-08-02 DIAGNOSIS — S92254A Nondisplaced fracture of navicular [scaphoid] of right foot, initial encounter for closed fracture: Secondary | ICD-10-CM | POA: Diagnosis not present

## 2017-08-02 MED ORDER — PREDNISONE 50 MG PO TABS: 50.0000 mg | ORAL_TABLET | Freq: Every day | ORAL | 0 refills | Status: DC

## 2017-08-02 MED ORDER — VITAMIN D (ERGOCALCIFEROL) 1.25 MG (50000 UNIT) PO CAPS: 50000.0000 [IU] | ORAL_CAPSULE | ORAL | 0 refills | Status: DC

## 2017-08-02 NOTE — Assessment & Plan Note (Signed)
Ultrasound showed that patient's flexor tendon seems to be intact. Patient does have significant weakness. I believe that there is potential injury to the digital nerve of the left ring finger with patient also having numbness. Patient will be started on prednisone to see that makes a difference with the nerve as well as B6 and B12. Encourage her to follow-up with a hand specialist for other interventions that may be necessary.

## 2017-08-02 NOTE — Assessment & Plan Note (Signed)
I believe the patient does have more of a navicular stress fracture that is causing the tightness now of the posterior tibialis. Patient work with Product/process development scientist to learn home exercises in greater detail. We discussed which activities doing which ones to avoid. Patient will avoid being barefoot. Once weekly vitamin D given as well. Patient will follow-up again 34 weeks.

## 2017-08-02 NOTE — Patient Instructions (Addendum)
Good to see you.  For the foot  Exercises 3 times a week.  Avoid being barefoot No high impact exercises Once weekly vitamin D for 12 weeks.   For ythe hand  Wear the splint day and night for next month till you see gramig.  Can try to move it a little bit but be careful The nerve is likely affected.  Prednisone daily for 5 days   Over the counter B12 1054mcg daily  B6 200mg  daily   See me again in 3-4 weeks

## 2017-08-13 DIAGNOSIS — F900 Attention-deficit hyperactivity disorder, predominantly inattentive type: Secondary | ICD-10-CM | POA: Diagnosis not present

## 2017-08-13 DIAGNOSIS — Z79899 Other long term (current) drug therapy: Secondary | ICD-10-CM | POA: Diagnosis not present

## 2017-08-13 DIAGNOSIS — F419 Anxiety disorder, unspecified: Secondary | ICD-10-CM | POA: Diagnosis not present

## 2017-08-13 DIAGNOSIS — F429 Obsessive-compulsive disorder, unspecified: Secondary | ICD-10-CM | POA: Diagnosis not present

## 2017-08-13 DIAGNOSIS — F338 Other recurrent depressive disorders: Secondary | ICD-10-CM | POA: Diagnosis not present

## 2017-08-24 ENCOUNTER — Other Ambulatory Visit (INDEPENDENT_AMBULATORY_CARE_PROVIDER_SITE_OTHER): Payer: BLUE CROSS/BLUE SHIELD

## 2017-08-24 ENCOUNTER — Ambulatory Visit (INDEPENDENT_AMBULATORY_CARE_PROVIDER_SITE_OTHER): Payer: BLUE CROSS/BLUE SHIELD | Admitting: Family Medicine

## 2017-08-24 ENCOUNTER — Encounter: Payer: Self-pay | Admitting: Family Medicine

## 2017-08-24 VITALS — BP 120/70 | HR 80 | Ht 61.0 in | Wt 184.0 lb

## 2017-08-24 DIAGNOSIS — M2550 Pain in unspecified joint: Secondary | ICD-10-CM

## 2017-08-24 DIAGNOSIS — M130 Polyarthritis, unspecified: Secondary | ICD-10-CM

## 2017-08-24 DIAGNOSIS — S92254D Nondisplaced fracture of navicular [scaphoid] of right foot, subsequent encounter for fracture with routine healing: Secondary | ICD-10-CM | POA: Diagnosis not present

## 2017-08-24 DIAGNOSIS — G5701 Lesion of sciatic nerve, right lower limb: Secondary | ICD-10-CM | POA: Diagnosis not present

## 2017-08-24 LAB — COMPREHENSIVE METABOLIC PANEL
ALBUMIN: 3.8 g/dL (ref 3.5–5.2)
ALT: 17 U/L (ref 0–35)
AST: 17 U/L (ref 0–37)
Alkaline Phosphatase: 56 U/L (ref 39–117)
BILIRUBIN TOTAL: 0.8 mg/dL (ref 0.2–1.2)
BUN: 12 mg/dL (ref 6–23)
CALCIUM: 9 mg/dL (ref 8.4–10.5)
CO2: 28 mEq/L (ref 19–32)
CREATININE: 0.7 mg/dL (ref 0.40–1.20)
Chloride: 105 mEq/L (ref 96–112)
GFR: 97.02 mL/min (ref >60.00)
Glucose, Bld: 74 mg/dL (ref 70–99)
Potassium: 3.6 mEq/L (ref 3.5–5.1)
Sodium: 140 mEq/L (ref 135–145)
Total Protein: 6.6 g/dL (ref 6.0–8.3)

## 2017-08-24 LAB — CBC WITH DIFFERENTIAL/PLATELET
BASOS ABS: 0 10*3/uL (ref 0.0–0.1)
Basophils Relative: 0.5 % (ref 0.0–3.0)
Eosinophils Absolute: 0.1 10*3/uL (ref 0.0–0.7)
Eosinophils Relative: 1 % (ref 0.0–5.0)
HCT: 38.9 % (ref 36.0–46.0)
Hemoglobin: 12.9 g/dL (ref 12.0–15.0)
LYMPHS ABS: 1.9 10*3/uL (ref 0.7–4.0)
Lymphocytes Relative: 34.1 % (ref 12.0–46.0)
MCHC: 33.2 g/dL (ref 30.0–36.0)
MCV: 89.9 fl (ref 78.0–100.0)
MONOS PCT: 6.7 % (ref 3.0–12.0)
Monocytes Absolute: 0.4 10*3/uL (ref 0.1–1.0)
Neutro Abs: 3.3 10*3/uL (ref 1.4–7.7)
Neutrophils Relative %: 57.7 % (ref 43.0–77.0)
Platelets: 212 10*3/uL (ref 150.0–400.0)
RBC: 4.32 Mil/uL (ref 3.87–5.11)
RDW: 12.8 % (ref 11.5–15.5)
WBC: 5.7 10*3/uL (ref 4.0–10.5)

## 2017-08-24 LAB — VITAMIN D 25 HYDROXY (VIT D DEFICIENCY, FRACTURES): VITD: 30.64 ng/mL (ref 30.00–100.00)

## 2017-08-24 LAB — IBC PANEL
Iron: 140 ug/dL (ref 42–145)
Saturation Ratios: 40.7 % (ref 20.0–50.0)
Transferrin: 246 mg/dL (ref 212.0–360.0)

## 2017-08-24 LAB — C-REACTIVE PROTEIN: CRP: 0.1 mg/dL — AB (ref 0.5–20.0)

## 2017-08-24 LAB — T3, FREE: T3, Free: 3.1 pg/mL (ref 2.3–4.2)

## 2017-08-24 LAB — TSH: TSH: 2.3 u[IU]/mL (ref 0.35–4.50)

## 2017-08-24 LAB — T4, FREE: Free T4: 0.81 ng/dL (ref 0.60–1.60)

## 2017-08-24 LAB — SEDIMENTATION RATE: Sed Rate: 6 mm/hr (ref 0–20)

## 2017-08-24 LAB — CORTISOL: CORTISOL PLASMA: 6.3 ug/dL

## 2017-08-24 MED ORDER — GABAPENTIN 100 MG PO CAPS: 200.0000 mg | ORAL_CAPSULE | Freq: Every day | ORAL | 3 refills | Status: DC

## 2017-08-24 NOTE — Assessment & Plan Note (Signed)
Patient is no swelling. No significant pain over the navicular bone at this time. Discussed icing regimen, home exercises, discussed continuing the over-the-counter orthotics. Think that this will be beneficial. Follow-up again in 4-6 weeks

## 2017-08-24 NOTE — Patient Instructions (Addendum)
Good to see you  .Ice 20 minutes 2 times daily. Usually after activity and before bed. The foot looks good New exercises 3 times a week.  Get labs downstairs Gabapentin 200mg  at night may help with the pain in the leg  I think we will breakthrough at some point lets just see if labs give [place] some direction.  I wish you luck on the finger See me again in 4 weeks.

## 2017-08-24 NOTE — Assessment & Plan Note (Signed)
Piriformis Syndrome  Using an anatomical model, reviewed with the patient the structures involved and how they related to diagnosis. The patient indicated understanding.   The patient was given a handout from Dr. Rouzier's book "The Sports Medicine Patient Advisor" describing the anatomy and rehabilitation of the following condition: Piriformis Syndrome  Also given a handout with more extensive Piriformis stretching, hip flexor and abductor strengthening, ham stretching  Rec deep massage, explained self-massage with ball RTC in 4 weeks 

## 2017-08-24 NOTE — Assessment & Plan Note (Signed)
Patient will have laboratory work him to rule out any other causes a patient's discomfort and pain. History of psoriasis and possible psoriatic arthritis could be contribute in. Vitamin D deficiency, adrenal insufficiency, as well as anemia could all be possible.

## 2017-08-24 NOTE — Progress Notes (Signed)
[name] [place],  [address] Phone: [phone] Subjective:    I'm seeing this patient by the request  of:    CC: Right hip pain, right foot pain  [id number]  Autumn [name] is a 41 y.o. female coming in with complaint of right foot pain. Says her foot did make improvements with the Prednisone however her foot is still bothering her. Patient can use to have a dull, throbbing aching sensation in the foot but it seems to be less than previously.  States now that the hip pain seems to be the worse. States that it is more in the buttocks area. Rates the severity of pain as 5 out of 10. States that sitting for long amount of time can cause more pounds. Has seen different providers for this previously.  Having more muscle aches over the course of time. Patient does have a history of psoriasis. Significant autoimmune diseases she states has been somewhat diagnosed in her family.       Past Medical History:  Diagnosis Date  . Anxiety   . Attention deficit hyperactivity disorder (ADHD) 02/16/2017   Sees [facility] for managment  . Calculus of kidney 06/07/2012   Overview:  Dr Karsten Ro - urology   . Depression   . Factor V Leiden carrier ([place])    heterozygous  . Hernia, rectovaginal 06/07/2012   Overview:  Dr Corinna Capra - gyn   . History of abnormal cervical Pap smear 1997   dysplasia- LEEP  . Migraine   . Ovarian cyst 1994  . Psoriasis 02/16/2017   -sees dermatologist  . Rectocele    Past Surgical History:  Procedure Laterality Date  . CERVICAL BIOPSY  W/ LOOP ELECTRODE EXCISION    . CESAREAN SECTION  2007/2010  . OVARIAN CYST SURGERY  1994  . TUBAL LIGATION  2010  . VULVA /PERINEUM BIOPSY  01/2015   Social History   Social History  . Marital status: Married    Spouse name: N/A  . Number of children: N/A  . Years of education: N/A   Social History Main Topics  . Smoking status: Never Smoker  .  Smokeless tobacco: Never Used  . Alcohol use 3.0 oz/week    5 Standard drinks or equivalent per week  . Drug use: No  . Sexual activity: Yes    Partners: Male    Birth control/ protection: Surgical   Other Topics Concern  . None   Social History Narrative   Work or School: homemaker      Home Situation: 2 children      Spiritual Beliefs:      Lifestyle: regular exercise      No Known Allergies Family History  Problem Relation Age of Onset  . Esophageal cancer Father   . Esophageal cancer Maternal Uncle   . Rectal cancer Maternal Uncle   . Diabetes Maternal Grandmother   . Heart disease Maternal Grandmother    Family history of psoriasis  Past medical history, social, surgical and family history all reviewed in electronic medical record.  No pertanent information unless stated regarding to the chief complaint.   Review of Systems:Review of systems updated and as accurate as of 08/24/17  No headache, visual changes, nausea, vomiting, diarrhea, constipation, dizziness, abdominal pain, skin rash, fevers, chills, night sweats, weight loss, swollen lymph nodes,  joint swelling,  chest pain, shortness of breath, mood changes. Positive body aches, muscle aches,  Objective  Blood pressure  120/70, pulse 80, height 5\' 1"  (1.549 m), weight 184 lb (83.5 kg), SpO2 99 %. Systems examined below as of 08/24/17   General: No apparent distress alert and oriented x3 mood and affect normal, dressed appropriately.  HEENT: Pupils equal, extraocular movements intact  Respiratory: Patient's speak in full sentences and does not appear short of breath  Cardiovascular: No lower extremity edema, non tender, no erythema  Skin: Warm dry intact with no signs of infection or rash on extremities or on axial skeleton.  Abdomen: Soft nontender  Neuro: Cranial nerves II through XII are intact, neurovascularly intact in all extremities with 2+ DTRs and 2+ pulses.  Lymph: No lymphadenopathy of posterior or  anterior cervical chain or axillae bilaterally.  Gait normal with good balance and coordination.  MSK:  Non tender with full range of motion and good stability and symmetric strength and tone of shoulders, elbows, wrist, hip, knee and ankles bilaterally.  Foot exam shows very minimal discomfort over the navicular bone. Back Exam:  Inspection: Poor core strength Motion: Flexion 45 deg, Extension 25 deg, Side Bending to 45 deg bilaterally,  Rotation to 45 deg bilaterally  SLR laying: Negative  XSLR laying: Negative  Palpable tenderness: Positive with pain in the piriformis. FABER: negative. Sensory change: Gross sensation intact to all lumbar and sacral dermatomes.  Reflexes: 2+ at both patellar tendons, 2+ at achilles tendons, Babinski's downgoing.  Strength at foot  Plantar-flexion: 5/5 Dorsi-flexion: 5/5 Eversion: 5/5 Inversion: 5/5  Leg strength  Quad: 5/5 Hamstring: 5/5 Hip flexor: 5/5 Hip abductors: 4/5 but symmetric Gait unremarkable.      Impression and Recommendations:     This case required medical decision making of moderate complexity.      Note: This dictation was prepared with Dragon dictation along with smaller phrase technology. Any transcriptional errors that result from this process are unintentional.

## 2017-08-25 ENCOUNTER — Encounter: Payer: Self-pay | Admitting: Family Medicine

## 2017-08-25 DIAGNOSIS — R768 Other specified abnormal immunological findings in serum: Secondary | ICD-10-CM

## 2017-08-25 NOTE — Progress Notes (Unsigned)
A lot more to go

## 2017-08-27 LAB — CALCIUM, IONIZED: Calcium, Ion: 5 mg/dL (ref 4.8–5.6)

## 2017-08-27 LAB — DHEA: DHEA: 250 ng/dL (ref 102–1185)

## 2017-08-27 LAB — PTH, INTACT AND CALCIUM
Calcium: 8.7 mg/dL (ref 8.6–10.2)
PTH: 31 pg/mL (ref 14–64)

## 2017-08-27 LAB — CYCLIC CITRUL PEPTIDE ANTIBODY, IGG

## 2017-08-27 LAB — ANTI-NUCLEAR AB-TITER (ANA TITER)

## 2017-08-27 LAB — ANA: ANA: POSITIVE — AB

## 2017-08-27 LAB — ANGIOTENSIN CONVERTING ENZYME: ANGIOTENSIN-CONVERTING ENZYME: 50 U/L (ref 9–67)

## 2017-08-27 LAB — RHEUMATOID FACTOR: Rhuematoid fact SerPl-aCnc: <14 IU/mL (ref <14)

## 2017-09-06 DIAGNOSIS — S61317A Laceration without foreign body of left little finger with damage to nail, initial encounter: Secondary | ICD-10-CM | POA: Diagnosis not present

## 2017-09-06 DIAGNOSIS — R200 Anesthesia of skin: Secondary | ICD-10-CM | POA: Diagnosis not present

## 2017-09-08 DIAGNOSIS — S61327D Laceration with foreign body of left little finger with damage to nail, subsequent encounter: Secondary | ICD-10-CM | POA: Diagnosis not present

## 2017-09-09 ENCOUNTER — Encounter: Payer: Self-pay | Admitting: Family Medicine

## 2017-09-15 DIAGNOSIS — S61327D Laceration with foreign body of left little finger with damage to nail, subsequent encounter: Secondary | ICD-10-CM | POA: Diagnosis not present

## 2017-09-21 ENCOUNTER — Ambulatory Visit: Payer: Self-pay

## 2017-09-21 ENCOUNTER — Ambulatory Visit (INDEPENDENT_AMBULATORY_CARE_PROVIDER_SITE_OTHER): Payer: BLUE CROSS/BLUE SHIELD | Admitting: Family Medicine

## 2017-09-21 ENCOUNTER — Encounter: Payer: Self-pay | Admitting: Family Medicine

## 2017-09-21 ENCOUNTER — Ambulatory Visit (INDEPENDENT_AMBULATORY_CARE_PROVIDER_SITE_OTHER)
Admission: RE | Admit: 2017-09-21 | Discharge: 2017-09-21 | Disposition: A | Discharge status: Home / Self Care | Payer: BLUE CROSS/BLUE SHIELD | Source: Ambulatory Visit | Attending: Family Medicine | Admitting: Family Medicine

## 2017-09-21 VITALS — BP 110/70 | HR 72 | Ht 62.0 in | Wt 186.0 lb

## 2017-09-21 DIAGNOSIS — G5701 Lesion of sciatic nerve, right lower limb: Secondary | ICD-10-CM

## 2017-09-21 DIAGNOSIS — M549 Dorsalgia, unspecified: Secondary | ICD-10-CM

## 2017-09-21 DIAGNOSIS — M79671 Pain in right foot: Secondary | ICD-10-CM | POA: Diagnosis not present

## 2017-09-21 DIAGNOSIS — M545 Low back pain: Secondary | ICD-10-CM | POA: Diagnosis not present

## 2017-09-21 DIAGNOSIS — S92254D Nondisplaced fracture of navicular [scaphoid] of right foot, subsequent encounter for fracture with routine healing: Secondary | ICD-10-CM | POA: Diagnosis not present

## 2017-09-21 DIAGNOSIS — M130 Polyarthritis, unspecified: Secondary | ICD-10-CM

## 2017-09-21 NOTE — Assessment & Plan Note (Signed)
Continues to give her discomfort. History of melanoma previously. X-rays the lower back ordered today but I do feel that there is still a good chance that this is piriformis syndrome. We'll consider possible injection with possible MRI of the back of the radicular symptoms continue. Monitor closely.

## 2017-09-21 NOTE — Assessment & Plan Note (Signed)
Stable at this time. Likely more of a reactive plantar fasciitis. Given injection today and will hopefully be beneficial. Continue the other conservative therapy including home exercises. Could be related to possible nerve impingement and we will consider an EMG if this continues as well. Follow up soon.

## 2017-09-21 NOTE — Assessment & Plan Note (Signed)
Patient is following up with rheumatology in the near future.

## 2017-09-21 NOTE — Patient Instructions (Signed)
Good to see you  Increase gabapentin to 200mg  at night for at least 1 week.  Injected foot today  Ice is your friend Continue everything else  Xray downstairs See me again in 2-3 weeks.

## 2017-09-21 NOTE — Progress Notes (Signed)
[name] [address], [place] [address] Phone: [phone] Subjective:       CC: Right hip pain, right foot pain f/u  [id number]  Autumn [name] is a 41 y.o. female coming in with complaint of right foot pain. Says her foot did make improvements with the Prednisone Patient states that unfortunately fell like she is worsening in. More on the medial aspect of the foot. Not as much on the bone. Seems to be on the soft tissue. Worse with activity does still.  States now that the hip pain seems to be the worse. Concern for more of a being a lumbar radiculopathy. Feels like it is radiating all the way to her toes. Still not debilitating but is very annoying. Patient is concerned and she'll never be able to increase her activity secondary to the discomfort and pain.  Having more muscle aches over the course of time. Patient does have a history of psoriasis. Significant autoimmune diseases she states has been somewhat diagnosed in her family. Patient is going to see a rheumatologist in the near future.       Past Medical History:  Diagnosis Date  . Anxiety   . Attention deficit hyperactivity disorder (ADHD) 02/16/2017   Sees [facility] for managment  . Calculus of kidney 06/07/2012   Overview:  Dr Karsten Ro - urology   . Depression   . Factor V Leiden carrier ([place])    heterozygous  . Hernia, rectovaginal 06/07/2012   Overview:  Dr Corinna Capra - gyn   . History of abnormal cervical Pap smear 1997   dysplasia- LEEP  . Migraine   . Ovarian cyst 1994  . Psoriasis 02/16/2017   -sees dermatologist  . Rectocele    Past Surgical History:  Procedure Laterality Date  . CERVICAL BIOPSY  W/ LOOP ELECTRODE EXCISION    . CESAREAN SECTION  2007/2010  . OVARIAN CYST SURGERY  1994  . TUBAL LIGATION  2010  . VULVA /PERINEUM BIOPSY  01/2015   Social History   Social History  . Marital status: Married    Spouse name: N/A  . Number of  children: N/A  . Years of education: N/A   Social History Main Topics  . Smoking status: Never Smoker  . Smokeless tobacco: Never Used  . Alcohol use 3.0 oz/week    5 Standard drinks or equivalent per week  . Drug use: No  . Sexual activity: Yes    Partners: Male    Birth control/ protection: Surgical   Other Topics Concern  . None   Social History Narrative   Work or School: homemaker      Home Situation: 2 children      Spiritual Beliefs:      Lifestyle: regular exercise      No Known Allergies Family History  Problem Relation Age of Onset  . Esophageal cancer Father   . Esophageal cancer Maternal Uncle   . Rectal cancer Maternal Uncle   . Diabetes Maternal Grandmother   . Heart disease Maternal Grandmother    Family history of psoriasis  Past medical history, social, surgical and family history all reviewed in electronic medical record.  No pertanent information unless stated regarding to the chief complaint.   Review of Systems:Review of systems updated and as accurate as of 09/21/17  No headache, visual changes, nausea, vomiting, diarrhea, constipation, dizziness, abdominal pain, skin rash, fevers, chills, night sweats, weight loss, swollen lymph nodes,  joint swelling,  chest pain, shortness of breath, mood changes. Positive body aches, muscle aches,  Objective  Blood pressure 110/70, pulse 72, height 5\' 2"  (1.575 m), weight 186 lb (84.4 kg), SpO2 99 %.  Systems examined below as of 09/21/17 General: NAD A&O x3 mood, affect normal  HEENT: Pupils equal, extraocular movements intact no nystagmus Respiratory: not short of breath at rest or with speaking Cardiovascular: No lower extremity edema, non tender Skin: Warm dry intact with no signs of infection or rash on extremities or on axial skeleton. Abdomen: Soft nontender, no masses Neuro: Cranial nerves  intact, neurovascularly intact in all extremities with 2+ DTRs and 2+ pulses. Lymph: No lymphadenopathy  appreciated today  Gait normal with good balance and coordination.  MSK: Non tender with full range of motion and good stability and symmetric strength and tone of shoulders, elbows, wrist,  knee hips and ankles bilaterally.   Foot exam shows Less tenderness over the navicular bone. More pain on the plantar aspect of the foot Back Exam:  Inspection: Mild loss of lordosis Motion: Flexion 45 deg, Extension 25 deg, Side Bending to 45 deg bilaterally,  Rotation to 45 deg bilaterally  SLR laying: Increasing tightness on the right side. XSLR laying: Negative  Palpable tenderness: Positive with pain in the piriformis. Patient also moderately tender over the paraspinal musculature. FABER: Positive right. Sensory change: Gross sensation intact to all lumbar and sacral dermatomes.  Reflexes: 2+ at both patellar tendons, 2+ at achilles tendons, Babinski's downgoing.  Strength at foot  Plantar-flexion: 5/5 Dorsi-flexion: 5/5 Eversion: 5/5 Inversion: 5/5  Leg strength  Quad: 5/5 Hamstring: 5/5 Hip flexor: 5/5 Hip abductors: 4/5 but symmetric Gait unremarkable.  Procedure: Real-time Ultrasound Guided Injection of right plantar fascial injection Device: GE Logiq Q7 Ultrasound guided injection is preferred based studies that show increased duration, increased effect, greater accuracy, decreased procedural pain, increased response rate, and decreased cost with ultrasound guided versus blind injection.  Verbal informed consent obtained.  Time-out conducted.  Noted no overlying erythema, induration, or other signs of local infection.  Skin prepped in a sterile fashion.  Local anesthesia: Topical Ethyl chloride.  With sterile technique and under real time ultrasound guidance: With a 25-gauge half-inch she was injected with a total of 0.5 mL of 0.5% Marcaine and 0.5 mL of Kenalog 40 mg/dL  Completed without difficulty  Pain immediately resolved suggesting accurate placement of the medication.  Advised to  call if fevers/chills, erythema, induration, drainage, or persistent bleeding.  Images permanently stored and available for review in the ultrasound unit.  Impression: Technically successful ultrasound guided injection.    Impression and Recommendations:     This case required medical decision making of moderate complexity.      Note: This dictation was prepared with Dragon dictation along with smaller phrase technology. Any transcriptional errors that result from this process are unintentional.

## 2017-09-27 DIAGNOSIS — L245 Irritant contact dermatitis due to other chemical products: Secondary | ICD-10-CM | POA: Diagnosis not present

## 2017-09-27 DIAGNOSIS — D2261 Melanocytic nevi of right upper limb, including shoulder: Secondary | ICD-10-CM | POA: Diagnosis not present

## 2017-09-27 DIAGNOSIS — L814 Other melanin hyperpigmentation: Secondary | ICD-10-CM | POA: Diagnosis not present

## 2017-09-27 DIAGNOSIS — Z85820 Personal history of malignant melanoma of skin: Secondary | ICD-10-CM | POA: Diagnosis not present

## 2017-09-29 DIAGNOSIS — S61327D Laceration with foreign body of left little finger with damage to nail, subsequent encounter: Secondary | ICD-10-CM | POA: Diagnosis not present

## 2017-10-07 ENCOUNTER — Other Ambulatory Visit: Payer: Self-pay | Admitting: Obstetrics & Gynecology

## 2017-10-07 ENCOUNTER — Ambulatory Visit (INDEPENDENT_AMBULATORY_CARE_PROVIDER_SITE_OTHER): Payer: BLUE CROSS/BLUE SHIELD | Admitting: Family Medicine

## 2017-10-07 ENCOUNTER — Encounter: Payer: Self-pay | Admitting: Family Medicine

## 2017-10-07 DIAGNOSIS — G5701 Lesion of sciatic nerve, right lower limb: Secondary | ICD-10-CM | POA: Diagnosis not present

## 2017-10-07 DIAGNOSIS — M999 Biomechanical lesion, unspecified: Secondary | ICD-10-CM | POA: Insufficient documentation

## 2017-10-07 DIAGNOSIS — Z1231 Encounter for screening mammogram for malignant neoplasm of breast: Secondary | ICD-10-CM

## 2017-10-07 NOTE — Progress Notes (Signed)
[name] [address] [place], [place] [address] Phone: [phone] Subjective:       CC: Right hip pain, right foot pain f/u  [id number]  Autumn [name] is a 41 y.o. female coming in with complaint of right foot pain. Right Plantar fascia was injected last follow-up. She said that the shot was very effective. She said that some pain is coming back. Patient is had very minimal. This is a 90% better.  Patient is also having back pain. X-rays the patient's back that rapidly visualized by me taken 09/21/2017 shows no significant bony abnormality. Patient was to continue home exercises. Concern for more of a piriformis syndrome. Patient states that she is taking gabapentin which has also been very effective. She does not care for the side effects of the gabapentin. States that she feels somewhat drugged in the morning secondary to the medicine.      Past Medical History:  Diagnosis Date  . Anxiety   . Attention deficit hyperactivity disorder (ADHD) 02/16/2017   Sees [facility] for managment  . Calculus of kidney 06/07/2012   Overview:  Dr Karsten Ro - urology   . Depression   . Factor V Leiden carrier ([place])    heterozygous  . Hernia, rectovaginal 06/07/2012   Overview:  Dr Corinna Capra - gyn   . History of abnormal cervical Pap smear 1997   dysplasia- LEEP  . Migraine   . Ovarian cyst 1994  . Psoriasis 02/16/2017   -sees dermatologist  . Rectocele    Past Surgical History:  Procedure Laterality Date  . CERVICAL BIOPSY  W/ LOOP ELECTRODE EXCISION    . CESAREAN SECTION  2007/2010  . OVARIAN CYST SURGERY  1994  . TUBAL LIGATION  2010  . VULVA /PERINEUM BIOPSY  01/2015   Social History   Social History  . Marital status: Married    Spouse name: N/A  . Number of children: N/A  . Years of education: N/A   Social History Main Topics  . Smoking status: Never Smoker  . Smokeless tobacco: Never Used  . Alcohol use 3.0 oz/week   5 Standard drinks or equivalent per week  . Drug use: No  . Sexual activity: Yes    Partners: Male    Birth control/ protection: Surgical   Other Topics Concern  . None   Social History Narrative   Work or School: homemaker      Home Situation: 2 children      Spiritual Beliefs:      Lifestyle: regular exercise      No Known Allergies Family History  Problem Relation Age of Onset  . Esophageal cancer Father   . Esophageal cancer Maternal Uncle   . Rectal cancer Maternal Uncle   . Diabetes Maternal Grandmother   . Heart disease Maternal Grandmother    Family history of psoriasis  Past medical history, social, surgical and family history all reviewed in electronic medical record.  No pertanent information unless stated regarding to the chief complaint.   Review of Systems: No headache, visual changes, nausea, vomiting, diarrhea, constipation, dizziness, abdominal pain, skin rash, fevers, chills, night sweats, weight loss, swollen lymph nodes, body aches, joint swelling, muscle aches, chest pain, shortness of breath, mood changes.  Positive body aches   Objective  Blood pressure 120/78, pulse 79, height 5\' 2"  (1.575 m), weight 186 lb (84.4 kg), last menstrual period 09/16/2017, SpO2 98 %.  Systems examined below as of 10/07/17 General: NAD  A&O x3 mood, affect normal  HEENT: Pupils equal, extraocular movements intact no nystagmus Respiratory: not short of breath at rest or with speaking Cardiovascular: No lower extremity edema, non tender Skin: Warm dry intact with no signs of infection or rash on extremities or on axial skeleton. Abdomen: Soft nontender, no masses Neuro: Cranial nerves  intact, neurovascularly intact in all extremities with 2+ DTRs and 2+ pulses. Lymph: No lymphadenopathy appreciated today  Gait normal with good balance and coordination.  MSK: Non tender with full range of motion and good stability and symmetric strength and tone of shoulders, elbows,  wrist,  knee hips and ankles bilaterally.   Foot exam is unremarkable bilaterally with full strength and minimal discomfort over the plantar fascia at the origin bilaterally Back Exam:  Inspection: Mild loss of lordosis Motion: Flexion 45 deg, Extension 25 deg, Side Bending to 45 deg bilaterally,  Rotation to 45 deg bilaterally  SLR laying: Increasing tightness on the right side. XSLR laying: Negative  Palpable tenderness: Mild discomfort more over the paraspinal musculature lumbar spine from previous exam FABER: Positive right. Sensory change: Gross sensation intact to all lumbar and sacral dermatomes.  Reflexes: 2+ at both patellar tendons, 2+ at achilles tendons, Babinski's downgoing.  Strength at foot  Plantar-flexion: 5/5 Dorsi-flexion: 5/5 Eversion: 5/5 Inversion: 5/5  Leg strength  Quad: 5/5 Hamstring: 5/5 Hip flexor: 5/5 Hip abductors: 4/5 but symmetric Gait unremarkable.  Osteopathic findings C6 flexed rotated and side bent left T9 extended rotated and side bent left L2 flexed rotated and side bent right Sacrum right on right    Impression and Recommendations:     This case required medical decision making of moderate complexity.      Note: This dictation was prepared with Dragon dictation along with smaller phrase technology. Any transcriptional errors that result from this process are unintentional.

## 2017-10-07 NOTE — Assessment & Plan Note (Signed)
Decision today to treat with OMT was based on Physical Exam  After verbal consent patient was treated with HVLA, ME, FPR techniques in cervical, thoracic, lumbar and sacral areas  Patient tolerated the procedure well with improvement in symptoms  Patient given exercises, stretches and lifestyle modifications  See medications in patient instructions if given  Patient will follow up in 4 weeks 

## 2017-10-07 NOTE — Assessment & Plan Note (Signed)
Still some mild tightness. Nothing severe. Encourage her to continue the home exercises. Started the idea potentially Effexor which patient declined. We'll consider this again at follow-up. Started osteopathic manipulation and patient responded well. No flatus will be beneficial. Following up with me again in 4 weeks.

## 2017-10-07 NOTE — Patient Instructions (Addendum)
Good to see you  Effexor 37.5 mg daily  Gabapentin 100mg  at night Dr. Briscoe Deutscher.  See me again in 4-6 weeks

## 2017-10-18 DIAGNOSIS — Z23 Encounter for immunization: Secondary | ICD-10-CM | POA: Diagnosis not present

## 2017-10-25 DIAGNOSIS — R768 Other specified abnormal immunological findings in serum: Secondary | ICD-10-CM | POA: Diagnosis not present

## 2017-10-25 DIAGNOSIS — M2550 Pain in unspecified joint: Secondary | ICD-10-CM | POA: Diagnosis not present

## 2017-10-25 DIAGNOSIS — L409 Psoriasis, unspecified: Secondary | ICD-10-CM | POA: Diagnosis not present

## 2017-10-26 DIAGNOSIS — S61327D Laceration with foreign body of left little finger with damage to nail, subsequent encounter: Secondary | ICD-10-CM | POA: Diagnosis not present

## 2017-11-03 ENCOUNTER — Ambulatory Visit
Admission: RE | Admit: 2017-11-03 | Discharge: 2017-11-03 | Disposition: A | Discharge status: Home / Self Care | Payer: BLUE CROSS/BLUE SHIELD | Source: Ambulatory Visit | Attending: Obstetrics & Gynecology | Admitting: Obstetrics & Gynecology

## 2017-11-03 DIAGNOSIS — Z1231 Encounter for screening mammogram for malignant neoplasm of breast: Secondary | ICD-10-CM

## 2017-11-04 ENCOUNTER — Ambulatory Visit: Payer: BLUE CROSS/BLUE SHIELD | Admitting: Family Medicine

## 2017-11-04 ENCOUNTER — Encounter: Payer: Self-pay | Admitting: Family Medicine

## 2017-11-04 VITALS — BP 108/72 | HR 86 | Wt 183.0 lb

## 2017-11-04 DIAGNOSIS — G5701 Lesion of sciatic nerve, right lower limb: Secondary | ICD-10-CM

## 2017-11-04 DIAGNOSIS — M999 Biomechanical lesion, unspecified: Secondary | ICD-10-CM | POA: Diagnosis not present

## 2017-11-04 MED ORDER — VITAMIN D (ERGOCALCIFEROL) 1.25 MG (50000 UNIT) PO CAPS: 50000.0000 [IU] | ORAL_CAPSULE | ORAL | 0 refills | Status: DC

## 2017-11-04 NOTE — Progress Notes (Signed)
[name] [name],  [address] Phone: [phone] Subjective:    I'm seeing this patient by the request  of:    CC: Bilateral foot pain, back pain follow-up  [id number]  Autumn [name] is a 41 y.o. female coming in for back and hip pain. Patient notes that she still has pain. She does feel that her pain is diminished with the use of Effexor as well as OMT.  She is very happy with the results so far.  Her foot has been doing great since having the injection. She also states that wearing proper shoes and doing exercises also helped.  Patient feels like she is making progress.    Past Medical History:  Diagnosis Date  . Anxiety   . Attention deficit hyperactivity disorder (ADHD) 02/16/2017   Sees [facility] for managment  . Calculus of kidney 06/07/2012   Overview:  Dr Karsten Ro - urology   . Depression   . Factor V Leiden carrier ([place])    heterozygous  . Hernia, rectovaginal 06/07/2012   Overview:  Dr Autumn [name] - gyn   . History of abnormal cervical Pap smear 1997   dysplasia- LEEP  . Migraine   . Ovarian cyst 1994  . Psoriasis 02/16/2017   -sees dermatologist  . Rectocele    Past Surgical History:  Procedure Laterality Date  . CERVICAL BIOPSY  W/ LOOP ELECTRODE EXCISION    . CESAREAN SECTION  2007/2010  . OVARIAN CYST SURGERY  1994  . TUBAL LIGATION  2010  . VULVA /PERINEUM BIOPSY  01/2015   Social History   Socioeconomic History  . Marital status: Married    Spouse name: Not on file  . Number of children: Not on file  . Years of education: Not on file  . Highest education level: Not on file  Social Needs  . Financial resource strain: Not on file  . Food insecurity - worry: Not on file  . Food insecurity - inability: Not on file  . Transportation needs - medical: Not on file  . Transportation needs - non-medical: Not on file  Occupational History  . Not on file  Tobacco Use  . Smoking  status: Never Smoker  . Smokeless tobacco: Never Used  Substance and Sexual Activity  . Alcohol use: Yes    Alcohol/week: 3.0 oz    Types: 5 Standard drinks or equivalent per week  . Drug use: No  . Sexual activity: Yes    Partners: Male    Birth control/protection: Surgical  Other Topics Concern  . Not on file  Social History Narrative   Work or School: homemaker      Home Situation: 2 children      Spiritual Beliefs:      Lifestyle: regular exercise   No Known Allergies Family History  Problem Relation Age of Onset  . Esophageal cancer Father   . Esophageal cancer Maternal Uncle   . Rectal cancer Maternal Uncle   . Diabetes Maternal Grandmother   . Heart disease Maternal Grandmother      Past medical history, social, surgical and family history all reviewed in electronic medical record.  No pertanent information unless stated regarding to the chief complaint.   Review of Systems:Review of systems updated and as accurate as of 11/04/17  No headache, visual changes, nausea, vomiting, diarrhea, constipation, dizziness, abdominal pain, skin rash, fevers, chills, night sweats, weight loss, swollen lymph nodes, body aches, joint swelling, , chest  pain, shortness of breath, mood changes.  Positive muscle aches  Objective  Last menstrual period 10/23/2017. Systems examined below as of 11/04/17   General: No apparent distress alert and oriented x3 mood and affect normal, dressed appropriately.  HEENT: Pupils equal, extraocular movements intact  Respiratory: Patient's speak in full sentences and does not appear short of breath  Cardiovascular: No lower extremity edema, non tender, no erythema  Skin: Warm dry intact with no signs of infection or rash on extremities or on axial skeleton.  Abdomen: Soft nontender  Neuro: Cranial nerves II through XII are intact, neurovascularly intact in all extremities with 2+ DTRs and 2+ pulses.  Lymph: No lymphadenopathy of posterior or  anterior cervical chain or axillae bilaterally.  Gait normal with good balance and coordination.  MSK:  Non tender with full range of motion and good stability and symmetric strength and tone of shoulders, elbows, wrist, hip, knee and ankles bilaterally.  Mild arthritic changes  Back Exam:  Inspection: Mild loss of lordosis and poor core strength Motion: Flexion 35 deg, Extension 25 deg, Side Bending to 45 deg bilaterally,  Rotation to 45 deg bilaterally  SLR laying: Negative  XSLR laying: Negative  Palpable tenderness: Tender to palpation in the paraspinal musculature mostly of the lumbar spine right greater than left. FABER: Mild tightness on the right. Sensory change: Gross sensation intact to all lumbar and sacral dermatomes.  Reflexes: 2+ at both patellar tendons, 2+ at achilles tendons, Babinski's downgoing.  Strength at foot  Plantar-flexion: 5/5 Dorsi-flexion: 5/5 Eversion: 5/5 Inversion: 5/5  Leg strength  Quad: 5/5 Hamstring: 5/5 Hip flexor: 5/5 Hip abductors: 4/5 on the right compared to complete 5 out of 5 strength in the left Gait unremarkable.  Osteopathic findings C6 flexed rotated and side bent left T2 extended rotated and side bent right inhaled rib T5 extended rotated and side bent left L2 flexed rotated and side bent right Sacrum right on right    Impression and Recommendations:     This case required medical decision making of moderate complexity.      Note: This dictation was prepared with Dragon dictation along with smaller phrase technology. Any transcriptional errors that result from this process are unintentional.

## 2017-11-04 NOTE — Patient Instructions (Addendum)
Good to see you  Ice is yoru friend New exercises 3 times a week  Keep up with everything else Autumn [name] are doing great  See me again in 6 weeks

## 2017-11-04 NOTE — Assessment & Plan Note (Signed)
Patient is doing relatively well.  Discussed with patient about home exercises, core strengthening, hip abductor strengthening.  Patient is encouraged by the results in the improvement so far.  Continue low dose of the Effexor.  Follow up again in 4-6 weeks

## 2017-11-04 NOTE — Assessment & Plan Note (Signed)
Decision today to treat with OMT was based on Physical Exam  After verbal consent patient was treated with HVLA, ME, FPR techniques in cervical, thoracic, rib, lumbar and sacral areas  Patient tolerated the procedure well with improvement in symptoms  Patient given exercises, stretches and lifestyle modifications  See medications in patient instructions if given  Patient will follow up in 4-6 weeks 

## 2017-11-10 DIAGNOSIS — Z79899 Other long term (current) drug therapy: Secondary | ICD-10-CM | POA: Diagnosis not present

## 2017-11-10 DIAGNOSIS — F419 Anxiety disorder, unspecified: Secondary | ICD-10-CM | POA: Diagnosis not present

## 2017-11-10 DIAGNOSIS — F338 Other recurrent depressive disorders: Secondary | ICD-10-CM | POA: Diagnosis not present

## 2017-11-10 DIAGNOSIS — F900 Attention-deficit hyperactivity disorder, predominantly inattentive type: Secondary | ICD-10-CM | POA: Diagnosis not present

## 2017-12-08 DIAGNOSIS — R05 Cough: Secondary | ICD-10-CM | POA: Diagnosis not present

## 2017-12-13 DIAGNOSIS — H6122 Impacted cerumen, left ear: Secondary | ICD-10-CM | POA: Diagnosis not present

## 2017-12-13 DIAGNOSIS — H9192 Unspecified hearing loss, left ear: Secondary | ICD-10-CM | POA: Diagnosis not present

## 2017-12-13 DIAGNOSIS — H9202 Otalgia, left ear: Secondary | ICD-10-CM | POA: Diagnosis not present

## 2017-12-15 NOTE — Progress Notes (Signed)
[name] [address] [place], [place] [address] Phone: [phone] Subjective:      CC: Back pain follow-up  [id number]  Autumn [name] is a 42 y.o. female coming in with complaint of back pain follow-up.  Patient found to have some muscle imbalances.  Started on Effexor.  We attempted osteopathic manipulation.  Patient wants to do home exercises.  Patient states overall she seems to be making improvement.  Has responded well to the osteopathic manipulation.  Patient states that the Effexor has been helping as well.      Past Medical History:  Diagnosis Date  . Anxiety   . Attention deficit hyperactivity disorder (ADHD) 02/16/2017   Sees [facility] for managment  . Calculus of kidney 06/07/2012   Overview:  Dr Karsten Ro - urology   . Depression   . Factor V Leiden carrier ([place])    heterozygous  . Hernia, rectovaginal 06/07/2012   Overview:  Dr Corinna Capra - gyn   . History of abnormal cervical Pap smear 1997   dysplasia- LEEP  . Migraine   . Ovarian cyst 1994  . Psoriasis 02/16/2017   -sees dermatologist  . Rectocele    Past Surgical History:  Procedure Laterality Date  . CERVICAL BIOPSY  W/ LOOP ELECTRODE EXCISION    . CESAREAN SECTION  2007/2010  . OVARIAN CYST SURGERY  1994  . TUBAL LIGATION  2010  . VULVA /PERINEUM BIOPSY  01/2015   Social History   Socioeconomic History  . Marital status: Married    Spouse name: None  . Number of children: None  . Years of education: None  . Highest education level: None  Social Needs  . Financial resource strain: None  . Food insecurity - worry: None  . Food insecurity - inability: None  . Transportation needs - medical: None  . Transportation needs - non-medical: None  Occupational History  . None  Tobacco Use  . Smoking status: Never Smoker  . Smokeless tobacco: Never Used  Substance and Sexual Activity  . Alcohol use: Yes    Alcohol/week: 3.0 oz    Types: 5  Standard drinks or equivalent per week  . Drug use: No  . Sexual activity: Yes    Partners: Male    Birth control/protection: Surgical  Other Topics Concern  . None  Social History Narrative   Work or School: homemaker      Home Situation: 2 children      Spiritual Beliefs:      Lifestyle: regular exercise   No Known Allergies Family History  Problem Relation Age of Onset  . Esophageal cancer Father   . Esophageal cancer Maternal Uncle   . Rectal cancer Maternal Uncle   . Diabetes Maternal Grandmother   . Heart disease Maternal Grandmother      Past medical history, social, surgical and family history all reviewed in electronic medical record.  No pertanent information unless stated regarding to the chief complaint.   Review of Systems:Review of systems updated and as accurate as of 12/16/17  No headache, visual changes, nausea, vomiting, diarrhea, constipation, dizziness, abdominal pain, skin rash, fevers, chills, night sweats, weight loss, swollen lymph nodes, body aches, joint swelling,  chest pain, shortness of breath, mood changes.  Positive muscle aches  Objective  Blood pressure 120/80, pulse 89, height 5\' 2"  (1.575 m), weight 182 lb (82.6 kg), SpO2 98 %. Systems examined below as of 12/16/17   General: No apparent distress alert  and oriented x3 mood and affect normal, dressed appropriately.  HEENT: Pupils equal, extraocular movements intact  Respiratory: Patient's speak in full sentences and does not appear short of breath  Cardiovascular: No lower extremity edema, non tender, no erythema  Skin: Warm dry intact with no signs of infection or rash on extremities or on axial skeleton.  Abdomen: Soft nontender  Neuro: Cranial nerves II through XII are intact, neurovascularly intact in all extremities with 2+ DTRs and 2+ pulses.  Lymph: No lymphadenopathy of posterior or anterior cervical chain or axillae bilaterally.  Gait normal with good balance and coordination.    MSK:  Non tender with full range of motion and good stability and symmetric strength and tone of shoulders, elbows, wrist, hip and ankles bilaterally.   Back Exam:  Inspection: Unremarkable  Motion: Flexion 45 deg, Extension 25 deg, Side Bending to 35 deg bilaterally,  Rotation to 45 deg bilaterally  SLR laying: Negative  XSLR laying: Negative  Palpable tenderness: Mild tightness in the thoracolumbar juncture right greater than left. FABER: Right. Sensory change: Gross sensation intact to all lumbar and sacral dermatomes.  Reflexes: 2+ at both patellar tendons, 2+ at achilles tendons, Babinski's downgoing.  Strength at foot  Plantar-flexion: 5/5 Dorsi-flexion: 5/5 Eversion: 5/5 Inversion: 5/5  Leg strength  Quad: 5/5 Hamstring: 5/5 Hip flexor: 5/5 Hip abductors: 5/5  Gait unremarkable.  Osteopathic findings C2 flexed rotated and side bent right C4 flexed rotated and side bent left T3 extended rotated and side bent right inhaled third rib T6 extended rotated and side bent left L2 flexed rotated and side bent right Sacrum right on right      Impression and Recommendations:     This case required medical decision making of moderate complexity.      Note: This dictation was prepared with Dragon dictation along with smaller phrase technology. Any transcriptional errors that result from this process are unintentional.

## 2017-12-16 ENCOUNTER — Ambulatory Visit: Payer: BLUE CROSS/BLUE SHIELD | Admitting: Family Medicine

## 2017-12-16 ENCOUNTER — Encounter: Payer: Self-pay | Admitting: Family Medicine

## 2017-12-16 VITALS — BP 120/80 | HR 89 | Ht 62.0 in | Wt 182.0 lb

## 2017-12-16 DIAGNOSIS — M130 Polyarthritis, unspecified: Secondary | ICD-10-CM | POA: Diagnosis not present

## 2017-12-16 DIAGNOSIS — M999 Biomechanical lesion, unspecified: Secondary | ICD-10-CM | POA: Diagnosis not present

## 2017-12-16 NOTE — Assessment & Plan Note (Signed)
Decision today to treat with OMT was based on Physical Exam  After verbal consent patient was treated with HVLA, ME, FPR techniques in cervical, thoracic, lumbar and sacral areas  Patient tolerated the procedure well with improvement in symptoms  Patient given exercises, stretches and lifestyle modifications  See medications in patient instructions if given  Patient will follow up in 6-8 weeks 

## 2017-12-16 NOTE — Assessment & Plan Note (Signed)
Doing remarkably better at this time.  Discussed icing regimen and home exercises.  We discussed which activities to do which wants to avoid.  Discussed with patient about avoiding certain activities.  Patient will increase activity for more of the strengthening aspect.  Follow-up with me again 6-8 weeks

## 2017-12-16 NOTE — Patient Instructions (Signed)
Good to see you  [phone] Effexor 75 daily for 10 days then write me See me again in 6-8 weeks

## 2017-12-23 ENCOUNTER — Encounter: Payer: Self-pay | Admitting: Family Medicine

## 2017-12-23 MED ORDER — VENLAFAXINE HCL ER 75 MG PO CP24: 75.0000 mg | ORAL_CAPSULE | Freq: Every day | ORAL | 1 refills | Status: DC

## 2018-01-21 ENCOUNTER — Encounter: Payer: Self-pay | Admitting: Family Medicine

## 2018-01-21 MED ORDER — VENLAFAXINE HCL ER 75 MG PO CP24: 75.0000 mg | ORAL_CAPSULE | Freq: Every day | ORAL | 1 refills | Status: DC

## 2018-02-02 NOTE — Progress Notes (Signed)
[name] [address] [place], [place] [address] Phone: [phone] Subjective:    CC: Neck pain and back pain follow-up  [id number]  Autumn [name] is a 42 y.o. female coming in with complaint of neck and back pain.  Patient has been seen multiple times and has responded very well to osteopathic manipulation.  Patient is being treated by rheumatologist for possible underlying autoimmune.  Patient is on 75 mg of Effexor and feels like it is beneficial.  Looking for other modalities to help her with some of her discomfort and pain.  Still not without pain ever.     Past Medical History:  Diagnosis Date  . Anxiety   . Attention deficit hyperactivity disorder (ADHD) 02/16/2017   Sees [facility] for managment  . Calculus of kidney 06/07/2012   Overview:  Dr Karsten Ro - urology   . Depression   . Factor V Leiden carrier ([place])    heterozygous  . Hernia, rectovaginal 06/07/2012   Overview:  Dr Corinna Capra - gyn   . History of abnormal cervical Pap smear 1997   dysplasia- LEEP  . Migraine   . Ovarian cyst 1994  . Psoriasis 02/16/2017   -sees dermatologist  . Rectocele    Past Surgical History:  Procedure Laterality Date  . CERVICAL BIOPSY  W/ LOOP ELECTRODE EXCISION    . CESAREAN SECTION  2007/2010  . OVARIAN CYST SURGERY  1994  . TUBAL LIGATION  2010  . VULVA /PERINEUM BIOPSY  01/2015   Social History   Socioeconomic History  . Marital status: Married    Spouse name: None  . Number of children: None  . Years of education: None  . Highest education level: None  Social Needs  . Financial resource strain: None  . Food insecurity - worry: None  . Food insecurity - inability: None  . Transportation needs - medical: None  . Transportation needs - non-medical: None  Occupational History  . None  Tobacco Use  . Smoking status: Never Smoker  . Smokeless tobacco: Never Used  Substance and Sexual Activity  . Alcohol use: Yes      Alcohol/week: 3.0 oz    Types: 5 Standard drinks or equivalent per week  . Drug use: No  . Sexual activity: Yes    Partners: Male    Birth control/protection: Surgical  Other Topics Concern  . None  Social History Narrative   Work or School: homemaker      Home Situation: 2 children      Spiritual Beliefs:      Lifestyle: regular exercise   No Known Allergies Family History  Problem Relation Age of Onset  . Esophageal cancer Father   . Esophageal cancer Maternal Uncle   . Rectal cancer Maternal Uncle   . Diabetes Maternal Grandmother   . Heart disease Maternal Grandmother      Past medical history, social, surgical and family history all reviewed in electronic medical record.  No pertanent information unless stated regarding to the chief complaint.   Review of Systems:Review of systems updated and as accurate as of 02/03/18  No headache, visual changes, nausea, vomiting, diarrhea, constipation, dizziness, abdominal pain, skin rash, fevers, chills, night sweats, weight loss, swollen lymph nodes, chest pain, shortness of breath, mood changes.  Positive muscle aches and intermittent joint swelling  Objective  Blood pressure 114/70, pulse 73, height 5\' 2"  (1.575 m), weight 185 lb (83.9 kg), SpO2 99 %. Systems examined below as  of 02/03/18   General: No apparent distress alert and oriented x3 mood and affect normal, dressed appropriately.  HEENT: Pupils equal, extraocular movements intact  Respiratory: Patient's speak in full sentences and does not appear short of breath  Cardiovascular: No lower extremity edema, non tender, no erythema  Skin: Warm dry intact with no signs of infection or rash on extremities or on axial skeleton.  Abdomen: Soft nontender  Neuro: Cranial nerves II through XII are intact, neurovascularly intact in all extremities with 2+ DTRs and 2+ pulses.  Lymph: No lymphadenopathy of posterior or anterior cervical chain or axillae bilaterally.  Gait  normal with good balance and coordination.  MSK:  Non tender with full range of motion and good stability and symmetric strength and tone of shoulders, elbows, wrist, hip, knee and ankles bilaterally.  Neck: Inspection mild loss of lordosis. No palpable stepoffs. Negative Spurling's maneuver. Mild decrease in range of motion of 5 degrees bilaterally Grip strength and sensation normal in bilateral hands Strength good C4 to T1 distribution No sensory change to C4 to T1 Negative Hoffman sign bilaterally Reflexes normal Right-sided trapezius  Back Exam:  Inspection: Mild loss of lordosis Motion: Flexion 45 deg, Extension 25 deg, Side Bending to 35 deg bilaterally,  Rotation to 35 deg bilaterally  SLR laying: Negative  XSLR laying: Negative  Palpable tenderness: Tender to palpation in the paraspinal musculature. FABER: negative. Sensory change: Gross sensation intact to all lumbar and sacral dermatomes.  Reflexes: 2+ at both patellar tendons, 2+ at achilles tendons, Babinski's downgoing.  Strength at foot  Plantar-flexion: 5/5 Dorsi-flexion: 5/5 Eversion: 5/5 Inversion: 5/5  Leg strength  Quad: 5/5 Hamstring: 5/5 Hip flexor: 5/5 Hip abductors: 4/5  Gait unremarkable.  Osteopathic findings C2 flexed rotated and side bent right C4 flexed rotated and side bent left T3 extended rotated and side bent right inhaled third rib T7 extended rotated and side bent left L2 flexed rotated and side bent right Sacrum right on right     Impression and Recommendations:     This case required medical decision making of moderate complexity.      Note: This dictation was prepared with Dragon dictation along with smaller phrase technology. Any transcriptional errors that result from this process are unintentional.

## 2018-02-03 ENCOUNTER — Encounter: Payer: Self-pay | Admitting: Family Medicine

## 2018-02-03 ENCOUNTER — Ambulatory Visit: Payer: BLUE CROSS/BLUE SHIELD | Admitting: Family Medicine

## 2018-02-03 VITALS — BP 114/70 | HR 73 | Ht 62.0 in | Wt 185.0 lb

## 2018-02-03 DIAGNOSIS — G5701 Lesion of sciatic nerve, right lower limb: Secondary | ICD-10-CM | POA: Diagnosis not present

## 2018-02-03 DIAGNOSIS — M999 Biomechanical lesion, unspecified: Secondary | ICD-10-CM | POA: Diagnosis not present

## 2018-02-03 NOTE — Patient Instructions (Signed)
Good to see you  Ice is your friend Send me the name  See me again in 6-8 weeks!

## 2018-02-03 NOTE — Assessment & Plan Note (Signed)
Continues to have some mild discomfort but nothing severe.  Continue with home exercises and icing regimen.  Patient has responded well to osteopathic manipulation of Effexor.  Follow-up again in 4-8 weeks

## 2018-02-03 NOTE — Assessment & Plan Note (Signed)
Decision today to treat with OMT was based on Physical Exam  After verbal consent patient was treated with HVLA, ME, FPR techniques in cervical, thoracic, lumbar and sacral areas  Patient tolerated the procedure well with improvement in symptoms  Patient given exercises, stretches and lifestyle modifications  See medications in patient instructions if given  Patient will follow up in 6 weeks 

## 2018-02-10 ENCOUNTER — Ambulatory Visit: Payer: BLUE CROSS/BLUE SHIELD | Admitting: Family Medicine

## 2018-02-11 DIAGNOSIS — F429 Obsessive-compulsive disorder, unspecified: Secondary | ICD-10-CM | POA: Diagnosis not present

## 2018-02-11 DIAGNOSIS — F900 Attention-deficit hyperactivity disorder, predominantly inattentive type: Secondary | ICD-10-CM | POA: Diagnosis not present

## 2018-02-11 DIAGNOSIS — F419 Anxiety disorder, unspecified: Secondary | ICD-10-CM | POA: Diagnosis not present

## 2018-02-11 DIAGNOSIS — F338 Other recurrent depressive disorders: Secondary | ICD-10-CM | POA: Diagnosis not present

## 2018-02-11 DIAGNOSIS — Z79899 Other long term (current) drug therapy: Secondary | ICD-10-CM | POA: Diagnosis not present

## 2018-03-02 DIAGNOSIS — M9903 Segmental and somatic dysfunction of lumbar region: Secondary | ICD-10-CM | POA: Diagnosis not present

## 2018-03-02 DIAGNOSIS — M5441 Lumbago with sciatica, right side: Secondary | ICD-10-CM | POA: Diagnosis not present

## 2018-03-02 DIAGNOSIS — M9904 Segmental and somatic dysfunction of sacral region: Secondary | ICD-10-CM | POA: Diagnosis not present

## 2018-03-02 DIAGNOSIS — M722 Plantar fascial fibromatosis: Secondary | ICD-10-CM | POA: Diagnosis not present

## 2018-03-07 DIAGNOSIS — M5441 Lumbago with sciatica, right side: Secondary | ICD-10-CM | POA: Diagnosis not present

## 2018-03-07 DIAGNOSIS — M9904 Segmental and somatic dysfunction of sacral region: Secondary | ICD-10-CM | POA: Diagnosis not present

## 2018-03-07 DIAGNOSIS — M9903 Segmental and somatic dysfunction of lumbar region: Secondary | ICD-10-CM | POA: Diagnosis not present

## 2018-03-07 DIAGNOSIS — M722 Plantar fascial fibromatosis: Secondary | ICD-10-CM | POA: Diagnosis not present

## 2018-03-09 DIAGNOSIS — L700 Acne vulgaris: Secondary | ICD-10-CM | POA: Diagnosis not present

## 2018-03-09 DIAGNOSIS — Z85820 Personal history of malignant melanoma of skin: Secondary | ICD-10-CM | POA: Diagnosis not present

## 2018-03-10 DIAGNOSIS — M9903 Segmental and somatic dysfunction of lumbar region: Secondary | ICD-10-CM | POA: Diagnosis not present

## 2018-03-10 DIAGNOSIS — M722 Plantar fascial fibromatosis: Secondary | ICD-10-CM | POA: Diagnosis not present

## 2018-03-10 DIAGNOSIS — M9904 Segmental and somatic dysfunction of sacral region: Secondary | ICD-10-CM | POA: Diagnosis not present

## 2018-03-10 DIAGNOSIS — M5441 Lumbago with sciatica, right side: Secondary | ICD-10-CM | POA: Diagnosis not present

## 2018-03-14 DIAGNOSIS — M5441 Lumbago with sciatica, right side: Secondary | ICD-10-CM | POA: Diagnosis not present

## 2018-03-14 DIAGNOSIS — M9904 Segmental and somatic dysfunction of sacral region: Secondary | ICD-10-CM | POA: Diagnosis not present

## 2018-03-14 DIAGNOSIS — M9903 Segmental and somatic dysfunction of lumbar region: Secondary | ICD-10-CM | POA: Diagnosis not present

## 2018-03-14 DIAGNOSIS — M722 Plantar fascial fibromatosis: Secondary | ICD-10-CM | POA: Diagnosis not present

## 2018-03-18 DIAGNOSIS — M5441 Lumbago with sciatica, right side: Secondary | ICD-10-CM | POA: Diagnosis not present

## 2018-03-18 DIAGNOSIS — M9904 Segmental and somatic dysfunction of sacral region: Secondary | ICD-10-CM | POA: Diagnosis not present

## 2018-03-18 DIAGNOSIS — M722 Plantar fascial fibromatosis: Secondary | ICD-10-CM | POA: Diagnosis not present

## 2018-03-18 DIAGNOSIS — M9903 Segmental and somatic dysfunction of lumbar region: Secondary | ICD-10-CM | POA: Diagnosis not present

## 2018-03-21 DIAGNOSIS — D225 Melanocytic nevi of trunk: Secondary | ICD-10-CM | POA: Diagnosis not present

## 2018-03-21 DIAGNOSIS — D485 Neoplasm of uncertain behavior of skin: Secondary | ICD-10-CM | POA: Diagnosis not present

## 2018-03-21 DIAGNOSIS — L700 Acne vulgaris: Secondary | ICD-10-CM | POA: Diagnosis not present

## 2018-03-21 DIAGNOSIS — L814 Other melanin hyperpigmentation: Secondary | ICD-10-CM | POA: Diagnosis not present

## 2018-03-21 DIAGNOSIS — Z85820 Personal history of malignant melanoma of skin: Secondary | ICD-10-CM | POA: Diagnosis not present

## 2018-03-21 DIAGNOSIS — D2261 Melanocytic nevi of right upper limb, including shoulder: Secondary | ICD-10-CM | POA: Diagnosis not present

## 2018-03-22 ENCOUNTER — Encounter: Payer: Self-pay | Admitting: Family Medicine

## 2018-03-22 ENCOUNTER — Ambulatory Visit: Payer: BLUE CROSS/BLUE SHIELD | Admitting: Family Medicine

## 2018-03-22 VITALS — BP 120/82 | HR 114 | Ht 62.0 in | Wt 185.0 lb

## 2018-03-22 DIAGNOSIS — M5416 Radiculopathy, lumbar region: Secondary | ICD-10-CM | POA: Diagnosis not present

## 2018-03-22 MED ORDER — PREDNISONE 50 MG PO TABS: 50.0000 mg | ORAL_TABLET | Freq: Every day | ORAL | 0 refills | Status: DC

## 2018-03-22 NOTE — Assessment & Plan Note (Signed)
Spent  25 minutes with patient face-to-face and had greater than 50% of counseling including as described in assessment and plan.  Discussed with patient in great length.  Patient has had some difficulty over the course last several days.  Patient has had some difficulty and feels like it is causing discomfort and pain.  Patient is having radicular symptoms down the leg.  Seem to be more of a piriformis syndrome.  X-rays now show any significant findings but patient does have a history of malignant melanoma and we will get advanced imaging at this time.  Depending on findings she could be a candidate for epidural injections.  Patient if back is normal we will consider piriformis injection.

## 2018-03-22 NOTE — Progress Notes (Signed)
[name] [address], [place] [address] Phone: [phone] Subjective:    I'm seeing this patient by the request  of:    CC: Low liver pain.  [id number]  Autumn [name] is a 42 y.o. female coming in with complaint of right foot pain. She had an injection in October 2018. Patient is now having pain in the foot that is increasing.   She said that her neck is doing much better. Her lower back is bothering her and she is having sciatic nerve pain. She is ready to figure out what the next step is as she has had pain for over 1 year. Does have radiating pain down the entire leg. She has been taking gabapentin and effexor. They do not alleviate enough of her pain due to the pain. Patient also complains of tightness in the right groin area.  Patient feels along with lumbar pain she is unable to do even daily activities sometimes.  Sometimes feels like she is doing relatively well though otherwise.  Other treatment we have tried in the past including Effexor which has helped somewhat with her mood.  Patient has had a plantar fascia injection in October 16.  Feels like it is the same type of pain again.  Attempted osteopathic manipulation and did not have as much improvement.  Patient has been going to a chiropractor much more regularly with also very minimal improvement.     Past Medical History:  Diagnosis Date  . Anxiety   . Attention deficit hyperactivity disorder (ADHD) 02/16/2017   Sees [facility] for managment  . Calculus of kidney 06/07/2012   Overview:  Dr Karsten Ro - urology   . Depression   . Factor V Leiden carrier ([place])    heterozygous  . Hernia, rectovaginal 06/07/2012   Overview:  Dr Corinna Capra - gyn   . History of abnormal cervical Pap smear 1997   dysplasia- LEEP  . Migraine   . Ovarian cyst 1994  . Psoriasis 02/16/2017   -sees dermatologist  . Rectocele    Past Surgical History:  Procedure Laterality Date  .  CERVICAL BIOPSY  W/ LOOP ELECTRODE EXCISION    . CESAREAN SECTION  2007/2010  . OVARIAN CYST SURGERY  1994  . TUBAL LIGATION  2010  . VULVA /PERINEUM BIOPSY  01/2015   Social History   Socioeconomic History  . Marital status: Married    Spouse name: Not on file  . Number of children: Not on file  . Years of education: Not on file  . Highest education level: Not on file  Occupational History  . Not on file  Social Needs  . Financial resource strain: Not on file  . Food insecurity:    Worry: Not on file    Inability: Not on file  . Transportation needs:    Medical: Not on file    Non-medical: Not on file  Tobacco Use  . Smoking status: Never Smoker  . Smokeless tobacco: Never Used  Substance and Sexual Activity  . Alcohol use: Yes    Alcohol/week: 3.0 oz    Types: 5 Standard drinks or equivalent per week  . Drug use: No  . Sexual activity: Yes    Partners: Male    Birth control/protection: Surgical  Lifestyle  . Physical activity:    Days per week: Not on file    Minutes per session: Not on file  . Stress: Not on file  Relationships  .  Social connections:    Talks on phone: Not on file    Gets together: Not on file    Attends religious service: Not on file    Active member of club or organization: Not on file    Attends meetings of clubs or organizations: Not on file    Relationship status: Not on file  Other Topics Concern  . Not on file  Social History Narrative   Work or School: homemaker      Home Situation: 2 children      Spiritual Beliefs:      Lifestyle: regular exercise   No Known Allergies Family History  Problem Relation Age of Onset  . Esophageal cancer Father   . Esophageal cancer Maternal Uncle   . Rectal cancer Maternal Uncle   . Diabetes Maternal Grandmother   . Heart disease Maternal Grandmother      Past medical history, social, surgical and family history all reviewed in electronic medical record.  No pertanent information unless  stated regarding to the chief complaint.   Review of Systems:Review of systems updated and as accurate as of 03/22/18  No headache, visual changes, nausea, vomiting, diarrhea, constipation, dizziness, abdominal pain, skin rash, fevers, chills, night sweats, weight loss, swollen lymph nodes, body aches, joint swelling, , chest pain, shortness of breath, mood changes.  Positive muscle aches  Objective  Blood pressure 120/82, pulse (!) 114, height 5\' 2"  (1.575 m), weight 185 lb (83.9 kg), SpO2 98 %. Systems examined below as of 03/22/18   General: No apparent distress alert and oriented x3 mood and affect normal, dressed appropriately.  HEENT: Pupils equal, extraocular movements intact  Respiratory: Patient's speak in full sentences and does not appear short of breath  Cardiovascular: No lower extremity edema, non tender, no erythema  Skin: Warm dry intact with no signs of infection or rash on extremities or on axial skeleton.  Abdomen: Soft nontender  Neuro: Cranial nerves II through XII are intact, neurovascularly intact in all extremities with 2+ DTRs and 2+ pulses.  Lymph: No lymphadenopathy of posterior or anterior cervical chain or axillae bilaterally.  Gait normal with good balance and coordination.  MSK: Diffuse tender with full range of motion and good stability and symmetric strength and tone of shoulders, elbows, wrist, hip, knee and ankles bilaterally.  Low back exam shows the patient does have a mild positive straight leg test.  4 out of 5 strength compared to contralateral side on the right side.  Patient has mild numbness as well.  Right heel shows severe tenderness to palpation on the medial calcaneal region.  Patient has full range of motion of the ankle.  Neurovascularly intact distally.    Impression and Recommendations:     This case required medical decision making of moderate complexity.      Note: This dictation was prepared with Dragon dictation along with smaller  phrase technology. Any transcriptional errors that result from this process are unintentional.

## 2018-03-22 NOTE — Patient Instructions (Signed)
Good to see you  Autumn [name] is your friend.  In 2 days start prednisone daily for 5 days.  Ice is your friend.  We will get MRI lumbar spine to see what is going on.  Once you have it I will write you. Then discuss next steps (either back injections, piriformis injections, EMG, etc) We will figure it out.

## 2018-03-23 DIAGNOSIS — M5441 Lumbago with sciatica, right side: Secondary | ICD-10-CM | POA: Diagnosis not present

## 2018-03-23 DIAGNOSIS — M9904 Segmental and somatic dysfunction of sacral region: Secondary | ICD-10-CM | POA: Diagnosis not present

## 2018-03-23 DIAGNOSIS — M9903 Segmental and somatic dysfunction of lumbar region: Secondary | ICD-10-CM | POA: Diagnosis not present

## 2018-03-23 DIAGNOSIS — M722 Plantar fascial fibromatosis: Secondary | ICD-10-CM | POA: Diagnosis not present

## 2018-04-05 ENCOUNTER — Ambulatory Visit: Payer: BLUE CROSS/BLUE SHIELD | Admitting: Family Medicine

## 2018-04-11 DIAGNOSIS — H5213 Myopia, bilateral: Secondary | ICD-10-CM | POA: Diagnosis not present

## 2018-04-11 DIAGNOSIS — H04123 Dry eye syndrome of bilateral lacrimal glands: Secondary | ICD-10-CM | POA: Diagnosis not present

## 2018-04-11 DIAGNOSIS — H10413 Chronic giant papillary conjunctivitis, bilateral: Secondary | ICD-10-CM | POA: Diagnosis not present

## 2018-04-12 ENCOUNTER — Ambulatory Visit
Admission: RE | Admit: 2018-04-12 | Discharge: 2018-04-12 | Disposition: A | Discharge status: Home / Self Care | Payer: BLUE CROSS/BLUE SHIELD | Source: Ambulatory Visit | Attending: Family Medicine | Admitting: Family Medicine

## 2018-04-12 DIAGNOSIS — M5416 Radiculopathy, lumbar region: Secondary | ICD-10-CM

## 2018-04-14 ENCOUNTER — Telehealth: Payer: Self-pay | Admitting: Obstetrics & Gynecology

## 2018-04-14 ENCOUNTER — Encounter: Payer: Self-pay | Admitting: Family Medicine

## 2018-04-14 ENCOUNTER — Encounter: Payer: Self-pay | Admitting: Obstetrics & Gynecology

## 2018-04-14 NOTE — Telephone Encounter (Signed)
Patient sent the following correspondence through [facility]. Routing to triage to assist patient with request.  ----- Message from [place], Generic sent at 04/14/2018 7:53 AM EDT -----    Hi! When you have a moment, can you look in my file at my MRI results from Tuesday? I'm due to see you 6/4, just need to know if this is something I need to see you about before then or if we discuss at my annual on 6/4. Thanks!!

## 2018-04-14 NOTE — Telephone Encounter (Signed)
Her MRI showed probable adenomyosis which is a benign condition but a cause of heavier bleeding.  I don't think she needs an appt sooner and I will review with her at her A[facility].  Of course if she is anxious, she can come in sooner but I think she will be fine.

## 2018-04-14 NOTE — Telephone Encounter (Signed)
[name] -please review 04/12/18 MRI of lumbar spine and advise.

## 2018-04-14 NOTE — Telephone Encounter (Signed)
Left detailed message, ok per dpr. Advised as seen below per [name]. Return call to office with any additional questions or to schedule OV.   Encounter closed.

## 2018-04-19 DIAGNOSIS — M9904 Segmental and somatic dysfunction of sacral region: Secondary | ICD-10-CM | POA: Diagnosis not present

## 2018-04-19 DIAGNOSIS — M5441 Lumbago with sciatica, right side: Secondary | ICD-10-CM | POA: Diagnosis not present

## 2018-04-19 DIAGNOSIS — M9903 Segmental and somatic dysfunction of lumbar region: Secondary | ICD-10-CM | POA: Diagnosis not present

## 2018-04-19 DIAGNOSIS — M722 Plantar fascial fibromatosis: Secondary | ICD-10-CM | POA: Diagnosis not present

## 2018-04-25 ENCOUNTER — Encounter: Payer: Self-pay | Admitting: Obstetrics & Gynecology

## 2018-04-25 DIAGNOSIS — Z0000 Encounter for general adult medical examination without abnormal findings: Secondary | ICD-10-CM | POA: Diagnosis not present

## 2018-04-25 DIAGNOSIS — Z1329 Encounter for screening for other suspected endocrine disorder: Secondary | ICD-10-CM | POA: Diagnosis not present

## 2018-04-25 DIAGNOSIS — Z13220 Encounter for screening for lipoid disorders: Secondary | ICD-10-CM | POA: Diagnosis not present

## 2018-04-25 DIAGNOSIS — Z114 Encounter for screening for human immunodeficiency virus [HIV]: Secondary | ICD-10-CM | POA: Diagnosis not present

## 2018-04-26 DIAGNOSIS — M722 Plantar fascial fibromatosis: Secondary | ICD-10-CM | POA: Diagnosis not present

## 2018-04-26 DIAGNOSIS — M9904 Segmental and somatic dysfunction of sacral region: Secondary | ICD-10-CM | POA: Diagnosis not present

## 2018-04-26 DIAGNOSIS — M9903 Segmental and somatic dysfunction of lumbar region: Secondary | ICD-10-CM | POA: Diagnosis not present

## 2018-04-26 DIAGNOSIS — M5441 Lumbago with sciatica, right side: Secondary | ICD-10-CM | POA: Diagnosis not present

## 2018-04-27 DIAGNOSIS — Z6833 Body mass index (BMI) 33.0-33.9, adult: Secondary | ICD-10-CM | POA: Diagnosis not present

## 2018-04-27 DIAGNOSIS — Z0000 Encounter for general adult medical examination without abnormal findings: Secondary | ICD-10-CM | POA: Diagnosis not present

## 2018-05-10 ENCOUNTER — Other Ambulatory Visit: Payer: Self-pay

## 2018-05-10 ENCOUNTER — Other Ambulatory Visit (HOSPITAL_COMMUNITY)
Admission: RE | Admit: 2018-05-10 | Discharge: 2018-05-10 | Disposition: A | Discharge status: Home / Self Care | Payer: BLUE CROSS/BLUE SHIELD | Source: Ambulatory Visit | Attending: Obstetrics & Gynecology | Admitting: Obstetrics & Gynecology

## 2018-05-10 ENCOUNTER — Ambulatory Visit (INDEPENDENT_AMBULATORY_CARE_PROVIDER_SITE_OTHER): Payer: BLUE CROSS/BLUE SHIELD | Admitting: Obstetrics & Gynecology

## 2018-05-10 ENCOUNTER — Encounter: Payer: Self-pay | Admitting: Obstetrics & Gynecology

## 2018-05-10 VITALS — BP 106/70 | HR 88 | Resp 16 | Ht 61.75 in | Wt 180.8 lb

## 2018-05-10 DIAGNOSIS — Z124 Encounter for screening for malignant neoplasm of cervix: Secondary | ICD-10-CM

## 2018-05-10 DIAGNOSIS — Z01419 Encounter for gynecological examination (general) (routine) without abnormal findings: Secondary | ICD-10-CM | POA: Diagnosis not present

## 2018-05-10 NOTE — Progress Notes (Signed)
[name] [place], [place] [address] Phone: [phone] Subjective:   Multiple complaints CC:   [id number]  Autumn [name] is a 42 y.o. female coming in with complaint of back pain. She continues to have right glute pain. Pain is worse at night and seems to move from the glute to the IT band.   Continues to have pain in right foot, plantar fascia. She also complains of pain on the top of her foot over the 4th and 5th metatarsal. Patient notes increase in pain after physical activity.  Right knee pain.  Has had difficulty previously and has had surgery.  Has not been seen for over 10 years.  Noticing increasing grinding sensation and disc comfort.  Patient states that squatting or going up stairs seems to make it worse.     Past Medical History:  Diagnosis Date  . Anxiety   . Attention deficit hyperactivity disorder (ADHD) 02/16/2017   Sees [facility] for managment  . Calculus of kidney 06/07/2012   Overview:  Dr Karsten Ro - urology   . Depression   . Factor V Leiden carrier ([place])    heterozygous  . Hernia, rectovaginal 06/07/2012   Overview:  Dr Corinna Capra - gyn   . History of abnormal cervical Pap smear 1997   dysplasia- LEEP  . Migraine   . Ovarian cyst 1994  . Psoriasis 02/16/2017   -sees dermatologist  . Rectocele    Past Surgical History:  Procedure Laterality Date  . CERVICAL BIOPSY  W/ LOOP ELECTRODE EXCISION    . CESAREAN SECTION  2007/2010  . OVARIAN CYST SURGERY  1994  . TUBAL LIGATION  2010  . VULVA /PERINEUM BIOPSY  01/2015   Social History   Socioeconomic History  . Marital status: Married    Spouse name: Not on file  . Number of children: Not on file  . Years of education: Not on file  . Highest education level: Not on file  Occupational History  . Not on file  Social Needs  . Financial resource strain: Not on file  . Food insecurity:    Worry: Not on file    Inability: Not on file    . Transportation needs:    Medical: Not on file    Non-medical: Not on file  Tobacco Use  . Smoking status: Never Smoker  . Smokeless tobacco: Never Used  Substance and Sexual Activity  . Alcohol use: Yes    Alcohol/week: 3.0 oz    Types: 5 Standard drinks or equivalent per week  . Drug use: No  . Sexual activity: Yes    Partners: Male    Birth control/protection: Surgical  Lifestyle  . Physical activity:    Days per week: Not on file    Minutes per session: Not on file  . Stress: Not on file  Relationships  . Social connections:    Talks on phone: Not on file    Gets together: Not on file    Attends religious service: Not on file    Active member of club or organization: Not on file    Attends meetings of clubs or organizations: Not on file    Relationship status: Not on file  Other Topics Concern  . Not on file  Social History Narrative   Work or School: homemaker      Home Situation: 2 children      Spiritual Beliefs:      Lifestyle: regular exercise  No Known Allergies Family History  Problem Relation Age of Onset  . Esophageal cancer Father   . Esophageal cancer Maternal Uncle   . Rectal cancer Maternal Uncle   . Diabetes Maternal Grandmother   . Heart disease Maternal Grandmother      Past medical history, social, surgical and family history all reviewed in electronic medical record.  No pertanent information unless stated regarding to the chief complaint.   Review of Systems:Review of systems updated and as accurate as of 05/11/18  No headache, visual changes, nausea, vomiting, diarrhea, constipation, dizziness, abdominal pain, skin rash, fevers, chills, night sweats, weight loss, swollen lymph nodes, body aches, joint swelling, chest pain, shortness of breath, mood changes.  Positive muscle aches  Objective  Blood pressure 122/78, pulse 91, height 5' 1.75" (1.568 m), weight 181 lb (82.1 kg), last menstrual period 04/16/2018, SpO2 92 %. Systems  examined below as of 05/11/18   General: No apparent distress alert and oriented x3 mood and affect normal, dressed appropriately.  HEENT: Pupils equal, extraocular movements intact  Respiratory: Patient's speak in full sentences and does not appear short of breath  Cardiovascular: No lower extremity edema, non tender, no erythema  Skin: Warm dry intact with no signs of infection or rash on extremities or on axial skeleton.  Abdomen: Soft nontender  Neuro: Cranial nerves II through XII are intact, neurovascularly intact in all extremities with 2+ DTRs and 2+ pulses.  Lymph: No lymphadenopathy of posterior or anterior cervical chain or axillae bilaterally.  Gait normal with good balance and coordination.  MSK:  Non tender with full range of motion and good stability and symmetric strength and tone of shoulders, elbows, wrist, hip, and ankles bilaterally.  Right knee exam shows the patient did have surgical repair over the patella.  Patient does have significant crepitus.  Mild lateral translation of the kneecap noted.  Positive grind noted.  4+ out of 5 strength compared to the contralateral side of the knee extension.  Full range of motion noted.  Neurovascular intact distally  Back exam shows a severely positive piriformis pain with Corky Sox.  Severely tender over the piriformis.  Procedure: Real-time Ultrasound Guided Injection of right piriformis injection Device: GE Logiq Q7 Ultrasound guided injection is preferred based studies that show increased duration, increased effect, greater accuracy, decreased procedural pain, increased response rate, and decreased cost with ultrasound guided versus blind injection.  Verbal informed consent obtained.  Time-out conducted.  Noted no overlying erythema, induration, or other signs of local infection.  Skin prepped in a sterile fashion.  Local anesthesia: Topical Ethyl chloride.  With sterile technique and under real time ultrasound guidance: With a  21-gauge 2 inch needle injected with a total of 1 cc of 0.5% Marcaine and 1 cc of Kenalog 40 mg/mL into the right piriformis tendon sheath Completed without difficulty  Pain immediately resolved suggesting accurate placement of the medication.  Advised to call if fevers/chills, erythema, induration, drainage, or persistent bleeding.  Images permanently stored and available for review in the ultrasound unit.  Impression: Technically successful ultrasound guided injection.     Impression and Recommendations:     This case required medical decision making of moderate complexity.      Note: This dictation was prepared with Dragon dictation along with smaller phrase technology. Any transcriptional errors that result from this process are unintentional.

## 2018-05-10 NOTE — Patient Instructions (Addendum)
Autumn Martinique, MD.  [place] [facility].    Adenomyosis

## 2018-05-10 NOTE — Progress Notes (Signed)
42 y.o. [id number] MarriedCaucasianF here for annual exam.  Reports she's got a "lot going on".  Having sciatic pain for the past year.  Has been seeing Dr. Hulan Saas.  W/U showed elevated ANA.  Saw rheumatologist at [facility].  Additional evaluation was negative except ANA was still elevated.  MRI was done 04/12/18.  Pt reports at time, she has pain that runs right down her butt down the back side of her leg and does have some foot pain.    Has been on gabapentin, has shoe inserts, has seen chiropractor, has done dry needling.    Has fallen five times this year.  Cannot figure out why she is falling.  Does not have dizziness.  Does feel like she looses her balance at time.  She does feel, at times, that she cannot recall a person's name even when she knows the person really well.    On another topic, her MRI showed. "heterogenous, mildly nodular appearance of the included portion of the uterus".    Cycles are regular between 25-28 days.  Flow is heavier than in the pads.  Does have some clotting.  Flow lasts for five days.    Being treated for hormonal/cystic acne with [name].  Has been considering Spironolactone.  Would like my opinion on this today.  PCP:  Dr. Ernie Hew.    Patient's last menstrual period was 04/16/2018.          Sexually active: Yes.    The current method of family planning is tubal ligation.    Exercising: Yes.    yoga, walking Smoker:  no  Health Maintenance:  Pap:  01/14/16 Neg. HR HPV:neg   12/18/14 Neg History of abnormal Pap:  Yes, LEEP 1997 MMG:  11/04/17 BIRADS1:Neg Colonoscopy: Never BMD:   Never TDaP: 09/2012 Screening Labs: PCP   reports that she has never smoked. She has never used smokeless tobacco. She reports that she drinks about 3.0 oz of alcohol per week. She reports that she does not use drugs.  Past Medical History:  Diagnosis Date  . Anxiety   . Attention deficit hyperactivity disorder (ADHD) 02/16/2017   [facility]  . Calculus of kidney 06/07/2012   Overview:  Dr Karsten Ro - urology   . Depression   . Factor V Leiden carrier ([place])    heterozygous  . Hernia, rectovaginal 06/07/2012   Overview:  Dr Corinna Capra - gyn   . History of abnormal cervical Pap smear 1997   dysplasia- LEEP  . Migraine   . Ovarian cyst 1994  . Psoriasis 02/16/2017   -sees dermatologist  . Rectocele     Past Surgical History:  Procedure Laterality Date  . CERVICAL BIOPSY  W/ LOOP ELECTRODE EXCISION    . CESAREAN SECTION  2007/2010  . OVARIAN CYST SURGERY  1994  . TUBAL LIGATION  2010  . VULVA /PERINEUM BIOPSY  01/2015    Current Outpatient Medications  Medication Sig Dispense Refill  . amphetamine-dextroamphetamine (ADDERALL) 15 MG tablet Take 15 mg by mouth daily.    Marland Kitchen BEPREVE 1.5 % SOLN   5  . clobetasol (TEMOVATE) 0.05 % external solution   1  . doxycycline (VIBRA-TABS) 100 MG tablet Take 1 tablet by mouth daily.  1  . METRONIDAZOLE, TOPICAL, 0.75 % LOTN daily.  1  . Probiotic Product (PROBIOTIC DAILY) CAPS Take by mouth daily.    . rizatriptan (MAXALT) 10 MG tablet Take 10 mg by mouth as needed for  migraine. May repeat in 2 hours if needed    . venlafaxine XR (EFFEXOR XR) 75 MG 24 hr capsule Take 1 capsule (75 mg total) by mouth daily with breakfast. 90 capsule 1   No current facility-administered medications for this visit.     Family History  Problem Relation Age of Onset  . Esophageal cancer Father   . Esophageal cancer Maternal Uncle   . Rectal cancer Maternal Uncle   . Diabetes Maternal Grandmother   . Heart disease Maternal Grandmother     Review of Systems  All other systems reviewed and are negative.   Exam:   BP 106/70 (BP Location: Right Arm, Patient Position: Sitting, Cuff Size: Large)   Pulse 88   Resp 16   Ht 5' 1.75" (1.568 m)   Wt 180 lb 12.8 oz (82 kg)   LMP 04/16/2018   BMI 33.34 kg/m     Height: 5' 1.75" (156.8 cm)  Ht Readings from Last 3 Encounters:  05/10/18  5' 1.75" (1.568 m)  03/22/18 5\' 2"  (1.575 m)  02/03/18 5\' 2"  (1.575 m)    General appearance: alert, cooperative and appears stated age Head: Normocephalic, without obvious abnormality, atraumatic Neck: no adenopathy, supple, symmetrical, trachea midline and thyroid normal to inspection and palpation Lungs: clear to auscultation bilaterally Breasts: normal appearance, no masses or tenderness Heart: regular rate and rhythm Abdomen: soft, non-tender; bowel sounds normal; no masses,  no organomegaly Extremities: extremities normal, atraumatic, no cyanosis or edema Skin: Skin color, texture, turgor normal. No rashes or lesions Lymph nodes: Cervical, supraclavicular, and axillary nodes normal. No abnormal inguinal nodes palpated Neurologic: Grossly normal   Pelvic: External genitalia:  no lesions              Urethra:  normal appearing urethra with no masses, tenderness or lesions              Bartholins and Skenes: normal                 Vagina: normal appearing vagina with normal color and discharge, no lesions              Cervix: no lesions              Pap taken: Yes.   Bimanual Exam:  Uterus:  normal size, contour, position, consistency, mobility, non-tender              Adnexa: normal adnexa and no mass, fullness, tenderness               Rectovaginal: Confirms               Anus:  normal sphincter tone, no lesions  Chaperone was present for exam.  A:  Well Woman with normal exam Recent  ADHD, sees Dr. Rachel Moulds H/o melanoma, followed by [name].   H/O LEEP 1995 Abnormal MRI findings, c/w adenomyosis  P:   Mammogram guidelines reviewed pap smear obtained today Consider neurology referral.  Will let me know if she wants this. Colonoscopy guidelines/colorectal screening guidelines reviewed. Return for PUS Return annually or prn

## 2018-05-11 ENCOUNTER — Ambulatory Visit: Payer: Self-pay

## 2018-05-11 ENCOUNTER — Ambulatory Visit: Payer: BLUE CROSS/BLUE SHIELD | Admitting: Family Medicine

## 2018-05-11 ENCOUNTER — Encounter: Payer: Self-pay | Admitting: Family Medicine

## 2018-05-11 ENCOUNTER — Ambulatory Visit (INDEPENDENT_AMBULATORY_CARE_PROVIDER_SITE_OTHER)
Admission: RE | Admit: 2018-05-11 | Discharge: 2018-05-11 | Disposition: A | Discharge status: Home / Self Care | Payer: BLUE CROSS/BLUE SHIELD | Source: Ambulatory Visit | Attending: Family Medicine | Admitting: Family Medicine

## 2018-05-11 VITALS — BP 122/78 | HR 91 | Ht 61.75 in | Wt 181.0 lb

## 2018-05-11 DIAGNOSIS — M5416 Radiculopathy, lumbar region: Secondary | ICD-10-CM | POA: Diagnosis not present

## 2018-05-11 DIAGNOSIS — G5701 Lesion of sciatic nerve, right lower limb: Secondary | ICD-10-CM

## 2018-05-11 DIAGNOSIS — M25561 Pain in right knee: Secondary | ICD-10-CM | POA: Diagnosis not present

## 2018-05-11 DIAGNOSIS — M1711 Unilateral primary osteoarthritis, right knee: Secondary | ICD-10-CM | POA: Diagnosis not present

## 2018-05-11 DIAGNOSIS — M25551 Pain in right hip: Secondary | ICD-10-CM

## 2018-05-11 LAB — CYTOLOGY - PAP
Adequacy: ABSENT
DIAGNOSIS: NEGATIVE

## 2018-05-11 NOTE — Patient Instructions (Signed)
Good to see yo u Tried piriformis injection today  Ice is your friend.  Continue everything else See me again in 3-4 weeks

## 2018-05-11 NOTE — Assessment & Plan Note (Signed)
Given injection.  Tolerated the procedure well.  Discussed icing regimen and home exercises.  Discussed topical anti-inflammatories.  If makes improvement consider PRP.

## 2018-05-11 NOTE — Assessment & Plan Note (Signed)
From arthritis.  Discussed icing regimen, bracing, home exercises for VMO stability.  Follow-up again in 4 weeks.  X-rays pending

## 2018-05-11 NOTE — Assessment & Plan Note (Signed)
MRI fairly unremarkable.  Could try a potential epidurals for pain relief but not likely.  Continue on the Effexor.

## 2018-05-13 ENCOUNTER — Encounter: Payer: Self-pay | Admitting: Obstetrics & Gynecology

## 2018-05-18 ENCOUNTER — Other Ambulatory Visit: Payer: Self-pay | Admitting: *Deleted

## 2018-05-18 DIAGNOSIS — R935 Abnormal findings on diagnostic imaging of other abdominal regions, including retroperitoneum: Secondary | ICD-10-CM

## 2018-05-19 ENCOUNTER — Encounter: Payer: Self-pay | Admitting: Obstetrics & Gynecology

## 2018-05-19 ENCOUNTER — Other Ambulatory Visit: Payer: Self-pay

## 2018-05-19 ENCOUNTER — Ambulatory Visit (INDEPENDENT_AMBULATORY_CARE_PROVIDER_SITE_OTHER): Payer: BLUE CROSS/BLUE SHIELD

## 2018-05-19 ENCOUNTER — Ambulatory Visit (INDEPENDENT_AMBULATORY_CARE_PROVIDER_SITE_OTHER): Payer: BLUE CROSS/BLUE SHIELD | Admitting: Obstetrics & Gynecology

## 2018-05-19 VITALS — BP 128/74 | HR 76 | Resp 14 | Ht 62.0 in | Wt 179.0 lb

## 2018-05-19 DIAGNOSIS — N920 Excessive and frequent menstruation with regular cycle: Secondary | ICD-10-CM | POA: Diagnosis not present

## 2018-05-19 DIAGNOSIS — R935 Abnormal findings on diagnostic imaging of other abdominal regions, including retroperitoneum: Secondary | ICD-10-CM

## 2018-05-19 NOTE — Progress Notes (Signed)
42 y.o. G5P2002 Married Caucasian female here for pelvic ultrasound due to abnormal finding on MRI that was done due to right hip pain.  Pt did see Dr. Tamala Julian last week and had injection which has completely resolved pain.  She is very pleased with this improvement.    Patient's last menstrual period was 05/17/2018.  Contraception: BTL  Findings:  UTERUS: 9.6 x 5.5 x 4.6cm with focal area of adenomyosis (c/w finding on MRI) EMS:3.17mm ADNEXA: Left ovary: 2.0 x 1.4 x 1.0cm       Right ovary: 2.8 x 1.3 x 2.2cm CUL DE SAC: no free fluid  Discussion:  Findings reviewed with pt.  She has been considering endometrial ablation for treatment of menorrhagia.  As the adenomyosis is focal, I do think she will have an increased failure rate but she could still have benefit as this is focal in appearance on ultrasound.  Mirena IUD also discussed.  Pt has undergone a BTL so aware this may not be a covered service.  She is not interested in oral OCPs or POPs due to side effects.  She is basically deciding between endometrial ablation and Mirena IUD for treatment of bleeding at this point.  Assessment:  Menorrhagia Abnormal MRI finding c/w adenomyosis on ultrasound  Plan:  Will precert Mirena IUD vs ablation for pt.  She will then make decision about treatment at that time.    ~15 minutes spent with patient >50% of time was in face to face discussion of above.

## 2018-05-23 ENCOUNTER — Telehealth: Payer: Self-pay | Admitting: Obstetrics & Gynecology

## 2018-05-23 NOTE — Telephone Encounter (Signed)
Patient returned call. Reviewed benefits for a Mirena UD insertion and an endometrial ablation. Patient understood information presented. Patient advise she would like to proceed with scheduling a Mirena IUD insertion. Patient is scheduled 06/03/18 with Dr Sabra Heck. Patient aware of appointment date, arrival time and cancellation policy. Patient had no further questions.   Routing to Dr Sabra Heck for final review. Patient agreeable to disposition. Will close encounter.      cc: Lamont Snowball, RN

## 2018-05-23 NOTE — Telephone Encounter (Signed)
Call placed to patient to review benefits for an IUD insertion verses the benefits for an endometrial ablation. Left voicemail message requesting a return call.   cc: Lamont Snowball, RN

## 2018-05-24 DIAGNOSIS — Z79899 Other long term (current) drug therapy: Secondary | ICD-10-CM | POA: Diagnosis not present

## 2018-05-24 DIAGNOSIS — F900 Attention-deficit hyperactivity disorder, predominantly inattentive type: Secondary | ICD-10-CM | POA: Diagnosis not present

## 2018-05-24 DIAGNOSIS — F902 Attention-deficit hyperactivity disorder, combined type: Secondary | ICD-10-CM | POA: Diagnosis not present

## 2018-05-26 NOTE — Telephone Encounter (Signed)
Call to patient. Left message to call back. G2 P2. Has had BTSP  LMP in chart, 04-16-18 (noted at last visit on 05-10-18). Call to check on status of menses.

## 2018-05-27 NOTE — Telephone Encounter (Signed)
Call from patient. LMP 05-17-18. Has continued spotting since menses stopped and this is unusual for her. Had ultrasound on 05-19-18. HX BTSP.  Discussed monitor/ observe. If persists into next week, call for office visit.  IUD insertion rescheduled to 06-16-18 based in expected date of next menses. Advised to take Motrin 800 mg one hour prior with food.   Patient aware Dr Sabra Heck will review call regarding spotting and will call her back if any changes to plan.   Routing to Dr Sabra Heck. Encounter closed.

## 2018-05-27 NOTE — Telephone Encounter (Signed)
Patient is retuning a call to Viola,

## 2018-05-27 NOTE — Telephone Encounter (Signed)
Call back to patient. Per ROI can leave message on voice mail.  Left message calling to check on LMP.

## 2018-06-01 ENCOUNTER — Telehealth: Payer: Self-pay | Admitting: Obstetrics & Gynecology

## 2018-06-01 NOTE — Telephone Encounter (Signed)
Return call to patient. States intermittent bleeding has continued since 05-19-18.  Very unuusal for her and is concerned. Has had BTSP. Plans for IUD for menorrhagia.  Office visit scheduled for tomorrow. Dr Sabra Heck will determine if should proceed with IUD insertion.  Routing to provider for final review. Patient agreeable to disposition. Will close encounter.

## 2018-06-01 NOTE — Telephone Encounter (Signed)
Spoke with patient. Has appointment scheduled for 6/27 at 2:30pm with [name] for irregular bleeding. IUD insertion on 06/16/18. Started spotting last night, full menses today. Patient unsure if OV still needed for evaluation or proceed with IUD insertion?   Patient denies pain or heavy bleeding.   Recommended patient keep OV as scheduled for 6/27 for further evaluation of irregular bleeding. [name] will advise on IUD insertion at that time. Advised [name] is out of the office, will review on 6/27 and return call if any additional recommendations. Patient agreeable.   Routing to provider for final review. Patient is agreeable to disposition. Will close encounter.

## 2018-06-01 NOTE — Telephone Encounter (Signed)
Patient stated that she started her cycle on 05/17/18, and that on Friday when she spoke with Gay Filler, she was spotting. She had been speaking with Gay Filler regarding IUD insertion, and now is unsure if/when she should have it done. Gay Filler stated that if it didn't stop to call back. She began spotting again late last night and then this morning, it was heavy again.

## 2018-06-02 ENCOUNTER — Encounter: Payer: Self-pay | Admitting: Obstetrics & Gynecology

## 2018-06-02 ENCOUNTER — Ambulatory Visit: Payer: BLUE CROSS/BLUE SHIELD | Admitting: Obstetrics & Gynecology

## 2018-06-02 VITALS — BP 108/66 | HR 80 | Resp 14 | Ht 62.0 in | Wt 178.0 lb

## 2018-06-02 DIAGNOSIS — Z30430 Encounter for insertion of intrauterine contraceptive device: Secondary | ICD-10-CM | POA: Diagnosis not present

## 2018-06-02 DIAGNOSIS — N800 Endometriosis of uterus: Secondary | ICD-10-CM | POA: Diagnosis not present

## 2018-06-02 DIAGNOSIS — N8003 Adenomyosis of the uterus: Secondary | ICD-10-CM

## 2018-06-02 DIAGNOSIS — N809 Endometriosis, unspecified: Secondary | ICD-10-CM

## 2018-06-02 DIAGNOSIS — N926 Irregular menstruation, unspecified: Secondary | ICD-10-CM

## 2018-06-02 LAB — POCT URINE PREGNANCY: Preg Test, Ur: NEGATIVE

## 2018-06-02 NOTE — Progress Notes (Signed)
GYNECOLOGY  VISIT  CC:   Irregular bleeding, possible IUD placement  HPI: 42 y.o. G70P2002 Married Caucasian female here for irregular vaginal bleeding.  Last cycle was 6/11 and it lasted for 10 days although some of this was spotting only.  She didn't bleed for three days and now she is spotting again on Tuesday night.  She started bleeding again yesterday.  Flow is heavier for her.  Using super tampons right now but not changing too frequently.    Has decided to proceed with IUD placement.  Would like this done today.  Recommend endometrial biopsy as well just to be sure considering irregular bleeding.   GYNECOLOGIC HISTORY: Patient's last menstrual period was 06/01/2018. Contraception: tubal ligation Menopausal hormone therapy: none  Patient Active Problem List   Diagnosis Date Noted  . Patellofemoral arthritis of right knee 05/11/2018  . Lumbar radiculopathy 03/22/2018  . Nonallopathic lesion of thoracic region 10/07/2017  . Nonallopathic lesion of cervical region 10/07/2017  . Nonallopathic lesion of lumbosacral region 10/07/2017  . Piriformis syndrome of right side 08/24/2017  . Polyarthropathy or polyarthritis of multiple sites 08/24/2017  . Closed right tarsal navicular fracture 08/02/2017  . 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 Attention deficit hyperactivity disorder (ADHD) 02/16/2017  . Flatulence 02/16/2017  . BMI 33.0-33.9,adult 02/16/2017  . Psoriasis 02/16/2017  . Heterozygous factor V Leiden mutation ([place]) 09/01/2016  . Headache, menstrual migraine 06/07/2012  . Malignant melanoma ([place]) 06/07/2012  . Dysmenorrhea 06/07/2012    Past Medical History:  Diagnosis Date  . Anxiety   . Attention deficit hyperactivity disorder (ADHD) 02/16/2017   Sees [facility] for managment  . Calculus of kidney 06/07/2012   Overview:  Dr Karsten Ro - urology   . Depression    . Factor V Leiden carrier ([place])    heterozygous  . Hernia, rectovaginal 06/07/2012   Overview:  Dr Corinna Capra - gyn   . History of abnormal cervical Pap smear 1997   dysplasia- LEEP  . Migraine   . Ovarian cyst 1994  . Psoriasis 02/16/2017   -sees dermatologist  . Rectocele     Past Surgical History:  Procedure Laterality Date  . CERVICAL BIOPSY  W/ LOOP ELECTRODE EXCISION    . CESAREAN SECTION  2007/2010  . OVARIAN CYST SURGERY  1994  . TUBAL LIGATION  2010  . VULVA /PERINEUM BIOPSY  01/2015    MEDS:   Current Outpatient Medications on File Prior to Visit  Medication Sig Dispense Refill  . BEPREVE 1.5 % SOLN   5  . doxycycline (VIBRA-TABS) 100 MG tablet Take 1 tablet by mouth daily.  1  . METRONIDAZOLE, TOPICAL, 0.75 % LOTN daily.  1  . MYDAYIS 25 MG CP24 Take 1 capsule by mouth daily.  0  . Probiotic Product (PROBIOTIC DAILY) CAPS Take by mouth daily.    . rizatriptan (MAXALT) 10 MG tablet Take 10 mg by mouth as needed for migraine. May repeat in 2 hours if needed    . venlafaxine XR (EFFEXOR XR) 75 MG 24 hr capsule Take 1 capsule (75 mg total) by mouth daily with breakfast. 90 capsule 1   No current facility-administered medications on file prior to visit.     ALLERGIES: Patient has no known allergies.  Family History  Problem Relation Age of Onset  . Esophageal cancer Father   . Esophageal cancer Maternal Uncle   . Rectal cancer  Maternal Uncle   . Diabetes Maternal Grandmother   . Heart disease Maternal Grandmother     SH:  Married, non smoker  Review of Systems  Genitourinary:       Excess bleeding Unscheduled bleeding   All other systems reviewed and are negative.   PHYSICAL EXAMINATION:    BP 108/66 (BP Location: Right Arm, Patient Position: Sitting, Cuff Size: Large)   Pulse 80   Resp 14   Ht 5\' 2"  (1.575 m)   Wt 178 lb (80.7 kg)   LMP 06/01/2018   BMI 32.56 kg/m     General appearance: alert, cooperative and appears stated age  Pelvic: External  genitalia:  no lesions              Urethra:  normal appearing urethra with no masses, tenderness or lesions              Bartholins and Skenes: normal                 Vagina: normal appearing vagina with normal color and discharge, no lesions              Cervix: no lesions              Bimanual Exam:  Uterus:  normal size, contour, position, consistency, mobility, non-tender              Adnexa: no mass, fullness, tenderness  Procedures:   Speculum placed.  Cervix cleansed with Betadine.  Anterior lip of cervix grasped with single toothed tenaculum.  Endometrial pipelle passed to fundus (about 7cm) and good tissue sample obtained with one pass.    Then IUD and introducer obtained.  This was passed to fundus and withdrawn slightly before Mirena IUD placed.  Introducer removed and strings cut to 2cm.  Tenaculum removed.  Minimal bleeding noted.  Pt tolerated procedure well.  Lot:  [id number]  Exp: 10/2020  Chaperone was present for exam.  Assessment: Irregular bleeding IUD placement today Adenomyosis  Plan: Biopsy results will be called to pt Recheck after IUD placement in about 8 weeks

## 2018-06-03 ENCOUNTER — Ambulatory Visit: Payer: BLUE CROSS/BLUE SHIELD | Admitting: Obstetrics & Gynecology

## 2018-06-03 NOTE — Progress Notes (Signed)
[name] [address], [place] [address] Phone: [phone] Subjective:     CC: Back pain follow-up  JSE:[id number]  Autumn [name] is a 42 y.o. female coming in with complaint of hip pain. She said the piriformis injection alleviate her pain. She is not having pain in her back either. She said that she has not felt this good in a year. Overall doing better.  Wants to start working on a regular basis but feels like he needs some improvement.     Past Medical History:  Diagnosis Date  . Anxiety   . Attention deficit hyperactivity disorder (ADHD) 02/16/2017   Sees [facility] for managment  . Calculus of kidney 06/07/2012   Overview:  Dr Karsten Ro - urology   . Depression   . Factor V Leiden carrier ([place])    heterozygous  . Hernia, rectovaginal 06/07/2012   Overview:  Dr Corinna Capra - gyn   . History of abnormal cervical Pap smear 1997   dysplasia- LEEP  . Migraine   . Ovarian cyst 1994  . Psoriasis 02/16/2017   -sees dermatologist  . Rectocele    Past Surgical History:  Procedure Laterality Date  . CERVICAL BIOPSY  W/ LOOP ELECTRODE EXCISION    . CESAREAN SECTION  2007/2010  . OVARIAN CYST SURGERY  1994  . TUBAL LIGATION  2010  . VULVA /PERINEUM BIOPSY  01/2015   Social History   Socioeconomic History  . Marital status: Married    Spouse name: Not on file  . Number of children: Not on file  . Years of education: Not on file  . Highest education level: Not on file  Occupational History  . Not on file  Social Needs  . Financial resource strain: Not on file  . Food insecurity:    Worry: Not on file    Inability: Not on file  . Transportation needs:    Medical: Not on file    Non-medical: Not on file  Tobacco Use  . Smoking status: Never Smoker  . Smokeless tobacco: Never Used  Substance and Sexual Activity  . Alcohol use: Yes    Alcohol/week: 3.0 oz    Types: 5 Standard drinks or equivalent per week  .  Drug use: No  . Sexual activity: Yes    Partners: Male    Birth control/protection: Surgical  Lifestyle  . Physical activity:    Days per week: Not on file    Minutes per session: Not on file  . Stress: Not on file  Relationships  . Social connections:    Talks on phone: Not on file    Gets together: Not on file    Attends religious service: Not on file    Active member of club or organization: Not on file    Attends meetings of clubs or organizations: Not on file    Relationship status: Not on file  Other Topics Concern  . Not on file  Social History Narrative   Work or School: homemaker      Home Situation: 2 children      Spiritual Beliefs:      Lifestyle: regular exercise   No Known Allergies Family History  Problem Relation Age of Onset  . Esophageal cancer Father   . Esophageal cancer Maternal Uncle   . Rectal cancer Maternal Uncle   . Diabetes Maternal Grandmother   . Heart disease Maternal Grandmother      Past medical history, social,  surgical and family history all reviewed in electronic medical record.  No pertanent information unless stated regarding to the chief complaint.   Review of Systems:Review of systems updated and as accurate as of 06/06/18  No headache, visual changes, nausea, vomiting, diarrhea, constipation, dizziness, abdominal pain, skin rash, fevers, chills, night sweats, weight loss, swollen lymph nodes, body aches, joint swelling, chest pain, shortness of breath, mood changes.  Positive muscle aches  Objective  Blood pressure 112/84, pulse 86, height 5\' 2"  (1.575 m), weight 180 lb (81.6 kg), last menstrual period 06/01/2018, SpO2 98 %. Systems examined below as of 06/06/18   General: No apparent distress alert and oriented x3 mood and affect normal, dressed appropriately.  HEENT: Pupils equal, extraocular movements intact  Respiratory: Patient's speak in full sentences and does not appear short of breath  Cardiovascular: No lower  extremity edema, non tender, no erythema  Skin: Warm dry intact with no signs of infection or rash on extremities or on axial skeleton.  Abdomen: Soft nontender  Neuro: Cranial nerves II through XII are intact, neurovascularly intact in all extremities with 2+ DTRs and 2+ pulses.  Lymph: No lymphadenopathy of posterior or anterior cervical chain or axillae bilaterally.  Gait normal with good balance and coordination.  MSK:  Non tender with full range of motion and good stability and symmetric strength and tone of shoulders, elbows, wrist, hip, knee and ankles bilaterally.  Back Exam:  Inspection: Mild loss of lordosis Motion: Flexion 45 deg, Extension 25 deg, Side Bending to 45 deg bilaterally,  Rotation to 45 deg bilaterally  SLR laying: Negative  XSLR laying: Negative  Palpable tenderness: Minimal discomfort over the piriformis. FABER: negative. Sensory change: Gross sensation intact to all lumbar and sacral dermatomes.  Reflexes: 2+ at both patellar tendons, 2+ at achilles tendons, Babinski's downgoing.  Strength at foot  Plantar-flexion: 5/5 Dorsi-flexion: 5/5 Eversion: 5/5 Inversion: 5/5  Leg strength  Quad: 5/5 Hamstring: 5/5 Hip flexor: 5/5 Hip abductors: 5/5  Gait unremarkable.  Osteopathic findingst T3 extended rotated and side bent right inhaled third rib T6 extended rotated and side bent left L2 flexed rotated and side bent right Sacrum right on right     Impression and Recommendations:     This case required medical decision making of moderate complexity.      Note: This dictation was prepared with Dragon dictation along with smaller phrase technology. Any transcriptional errors that result from this process are unintentional.

## 2018-06-06 ENCOUNTER — Ambulatory Visit: Payer: BLUE CROSS/BLUE SHIELD | Admitting: Family Medicine

## 2018-06-06 ENCOUNTER — Encounter: Payer: Self-pay | Admitting: Family Medicine

## 2018-06-06 VITALS — BP 112/84 | HR 86 | Ht 62.0 in | Wt 180.0 lb

## 2018-06-06 DIAGNOSIS — M999 Biomechanical lesion, unspecified: Secondary | ICD-10-CM

## 2018-06-06 DIAGNOSIS — M25559 Pain in unspecified hip: Secondary | ICD-10-CM | POA: Diagnosis not present

## 2018-06-06 DIAGNOSIS — G5701 Lesion of sciatic nerve, right lower limb: Secondary | ICD-10-CM | POA: Diagnosis not present

## 2018-06-06 NOTE — Assessment & Plan Note (Signed)
Decision today to treat with OMT was based on Physical Exam  After verbal consent patient was treated with HVLA, ME, FPR techniques in  thoracic, lumbar and sacral areas  Patient tolerated the procedure well with improvement in symptoms  Patient given exercises, stretches and lifestyle modifications  See medications in patient instructions if given  Patient will follow up in 12 weeks 

## 2018-06-06 NOTE — Assessment & Plan Note (Signed)
Decision today to treat with OMT was based on Physical Exam  After verbal consent patient was treated with HVLA, ME, FPR techniques in cervical, thoracic, lumbar and sacral areas  Patient tolerated the procedure well with improvement in symptoms  Patient given exercises, stretches and lifestyle modifications  See medications in patient instructions if given  Patient will follow up in 1-2 weeks 

## 2018-06-06 NOTE — Patient Instructions (Signed)
Good to see you  I am so happy you are doing better Call Claiborne Billings and get her to work you over.  See me again in 3 months

## 2018-06-06 NOTE — Assessment & Plan Note (Signed)
Significant improvement.  Discussed icing regimen and core strengthening.  Restart formal physical therapy that I think will be beneficial.  Follow-up with me again in 3 months

## 2018-06-14 ENCOUNTER — Ambulatory Visit: Payer: BLUE CROSS/BLUE SHIELD

## 2018-06-16 ENCOUNTER — Ambulatory Visit: Payer: BLUE CROSS/BLUE SHIELD

## 2018-06-16 ENCOUNTER — Ambulatory Visit: Payer: BLUE CROSS/BLUE SHIELD | Admitting: Obstetrics & Gynecology

## 2018-06-17 DIAGNOSIS — Z85820 Personal history of malignant melanoma of skin: Secondary | ICD-10-CM | POA: Diagnosis not present

## 2018-06-17 DIAGNOSIS — L570 Actinic keratosis: Secondary | ICD-10-CM | POA: Diagnosis not present

## 2018-06-17 DIAGNOSIS — L700 Acne vulgaris: Secondary | ICD-10-CM | POA: Diagnosis not present

## 2018-06-20 DIAGNOSIS — N926 Irregular menstruation, unspecified: Secondary | ICD-10-CM | POA: Diagnosis not present

## 2018-06-20 DIAGNOSIS — Z6832 Body mass index (BMI) 32.0-32.9, adult: Secondary | ICD-10-CM | POA: Diagnosis not present

## 2018-06-20 DIAGNOSIS — G43009 Migraine without aura, not intractable, without status migrainosus: Secondary | ICD-10-CM | POA: Diagnosis not present

## 2018-06-20 DIAGNOSIS — M549 Dorsalgia, unspecified: Secondary | ICD-10-CM | POA: Diagnosis not present

## 2018-06-30 ENCOUNTER — Ambulatory Visit: Payer: BLUE CROSS/BLUE SHIELD | Admitting: Obstetrics & Gynecology

## 2018-07-13 ENCOUNTER — Encounter: Payer: Self-pay | Admitting: Family Medicine

## 2018-07-13 MED ORDER — VENLAFAXINE HCL ER 37.5 MG PO CP24
37.5000 mg | ORAL_CAPSULE | Freq: Every day | ORAL | 1 refills | Status: DC
Start: 2018-07-13 — End: 2018-09-20

## 2018-07-18 ENCOUNTER — Telehealth: Payer: Self-pay | Admitting: Obstetrics & Gynecology

## 2018-07-18 ENCOUNTER — Encounter: Payer: Self-pay | Admitting: Obstetrics & Gynecology

## 2018-07-18 MED ORDER — FLUCONAZOLE 150 MG PO TABS: ORAL_TABLET | ORAL | 0 refills | Status: DC

## 2018-07-18 NOTE — Telephone Encounter (Signed)
Ok to send in rx for diflucan 150mg  po x 1, repeat 72 hours.  If not improved after second dosage, needs OV.  Thanks.

## 2018-07-18 NOTE — Telephone Encounter (Signed)
Patient sent the following correspondence through [facility]. Routing to triage to assist patient with request.  Hi! I'm [place] vacation at the beach and I have symptoms of vaginitis or a yeast infection brewing. No discharge at this point, just itchy, irritated and a bit of an odor. Would diflucan help? Or something else? If so, could you please send a prescription to the [facility] in [address] [place]? The address is [address], [place], [place] [address].     Thank you!

## 2018-07-18 NOTE — Telephone Encounter (Signed)
Message   [place], thank you so much. I'll be back in [place] next Monday and will call if still having issues. Thanks again!     ----- Message -----  From: Nurse Kallie Edward  Sent: 07/18/18, 3:33 PM  To: Wendall Mola  Subject: RE: Non-Urgent Medical Question    Ms. Wakefield,     [name] has sent in a prescription for you for Diflucan 150 mg to the [facility] in [place]. You may take one dose today and repeat the dosage again in 3 days if your symptoms do not improve. If you continue to have symptoms, she will need to see you for an office visit.   I hope you feel better and can continue your fun vacation!     Sincerely,     Karen Chafe, RN   Triage Nurse     ----- Message -----   From: Wendall Mola   Sent: 07/18/2018 10:06 AM EDT    To: Megan Salon, MD  Subject: Non-Urgent Medical Question    Hi! I'm on vacation at the beach and I have symptoms of vaginitis or a yeast infection brewing. No discharge at this point, just itchy, irritated and a bit of an odor. Would diflucan help? Or something else? If so, could you please send a prescription to the [facility] in [place]? The address is [address], [place], [place] [address].     Thank you!

## 2018-07-18 NOTE — Telephone Encounter (Signed)
Mychart message sent to patient as she is on vacation.  Rx per [name] sent to [facility], [place].

## 2018-07-18 NOTE — Telephone Encounter (Signed)
Call to patient. Reviewed phone note. Patient away at the beach. Has used hydrocortisone for itching and not improved.  Used AZO oral yeast treatment without improvement. Denies fevers, pelvic pain, or bleeding.   Advised will forward patients message to [name] for review as patient is out of town and cannot come in for an office visit. Pt agreeable to plan.

## 2018-07-18 NOTE — Telephone Encounter (Signed)
Noted message from patient via mychart. Will close encounter.

## 2018-08-09 ENCOUNTER — Ambulatory Visit (INDEPENDENT_AMBULATORY_CARE_PROVIDER_SITE_OTHER): Payer: BLUE CROSS/BLUE SHIELD | Admitting: Obstetrics & Gynecology

## 2018-08-09 ENCOUNTER — Encounter: Payer: Self-pay | Admitting: Obstetrics & Gynecology

## 2018-08-09 ENCOUNTER — Encounter: Payer: Self-pay | Admitting: Family Medicine

## 2018-08-09 VITALS — BP 112/70 | HR 80 | Resp 16 | Ht 62.0 in | Wt 188.6 lb

## 2018-08-09 DIAGNOSIS — N938 Other specified abnormal uterine and vaginal bleeding: Secondary | ICD-10-CM | POA: Diagnosis not present

## 2018-08-09 DIAGNOSIS — Z30431 Encounter for routine checking of intrauterine contraceptive device: Secondary | ICD-10-CM

## 2018-08-09 DIAGNOSIS — N898 Other specified noninflammatory disorders of vagina: Secondary | ICD-10-CM | POA: Diagnosis not present

## 2018-08-09 DIAGNOSIS — N893 Dysplasia of vagina, unspecified: Secondary | ICD-10-CM | POA: Diagnosis not present

## 2018-08-09 NOTE — Progress Notes (Signed)
GYNECOLOGY  VISIT  CC:   IUD recheck  HPI: 42 y.o. G62P2002 Married Caucasian female here for IUD check.  Mirena IUD placed 06/02/18.  Has been having almost daily spotting.  Has had two full cycles.  Flow is a little lighter.  Has had some spotting after intercourse.  Feels the bleeding is just a annoying enough to be bothersome.    Has noticed since the IUD placement and she's had some intermittent odor.  She sent a [employer].  She was at the beach.  She was also having some itching.  A diflucan was called into the pharmacy.  She reports basically as soon as the prescription was called into the pharmacy this improved.  Over the weekend, the feels the odor has returned.    GYNECOLOGIC HISTORY: Patient's last menstrual period was 07/23/2018. Contraception: Mirena placed 06/02/18 Menopausal hormone therapy: none  Patient Active Problem List   Diagnosis Date Noted  . Patellofemoral arthritis of right knee 05/11/2018  . Lumbar radiculopathy 03/22/2018  . Nonallopathic lesion of thoracic region 10/07/2017  . Nonallopathic lesion of cervical region 10/07/2017  . Nonallopathic lesion of lumbosacral region 10/07/2017  . Piriformis syndrome of right side 08/24/2017  . Polyarthropathy or polyarthritis of multiple sites 08/24/2017  . Closed right tarsal navicular fracture 08/02/2017  . 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 Mild episode of recurrent major depressive disorder ([place]) 02/16/2017  . Attention deficit hyperactivity disorder (ADHD) 02/16/2017  . Flatulence 02/16/2017  . BMI 33.0-33.9,adult 02/16/2017  . Psoriasis 02/16/2017  . Heterozygous factor V Leiden mutation ([place]) 09/01/2016  . Headache, menstrual migraine 06/07/2012  . Malignant melanoma ([place]) 06/07/2012  . Dysmenorrhea 06/07/2012    Past Medical History:  Diagnosis Date  . Anxiety   . Attention deficit hyperactivity disorder (ADHD) 02/16/2017   Sees [facility] for managment  .  Calculus of kidney 06/07/2012   Overview:  Dr Karsten Ro - urology   . Depression   . Factor V Leiden carrier ([place])    heterozygous  . Hernia, rectovaginal 06/07/2012   Overview:  Dr Corinna Capra - gyn   . History of abnormal cervical Pap smear 1997   dysplasia- LEEP  . Migraine   . Ovarian cyst 1994  . Psoriasis 02/16/2017   -sees dermatologist  . Rectocele     Past Surgical History:  Procedure Laterality Date  . CERVICAL BIOPSY  W/ LOOP ELECTRODE EXCISION    . CESAREAN SECTION  2007/2010  . OVARIAN CYST SURGERY  1994  . TUBAL LIGATION  2010  . VULVA /PERINEUM BIOPSY  01/2015    MEDS:   Current Outpatient Medications on File Prior to Visit  Medication Sig Dispense Refill  . BEPREVE 1.5 % SOLN   5  . doxycycline (VIBRA-TABS) 100 MG tablet Take 1 tablet by mouth daily.  1  . METRONIDAZOLE, TOPICAL, 0.75 % LOTN daily.  1  . Multiple Vitamin (MULTI-VITAMINS) TABS Take by mouth daily.    Marland Kitchen MYDAYIS 25 MG CP24 Take 1 capsule by mouth daily.  0  . naproxen sodium (ANAPROX) 550 MG tablet daily.    . Probiotic Product (PROBIOTIC DAILY) CAPS Take by mouth daily.    . rizatriptan (MAXALT) 10 MG tablet Take 10 mg by mouth as needed for migraine. May repeat in 2 hours if needed    . tretinoin (RETIN-A) 0.05 % cream daily.  2  . venlafaxine XR (EFFEXOR XR) 37.5 MG 24 hr capsule Take 1 capsule (37.5 mg  total) by mouth daily with breakfast. 30 capsule 1   No current facility-administered medications on file prior to visit.     ALLERGIES: Patient has no known allergies.  Family History  Problem Relation Age of Onset  . Esophageal cancer Father   . Esophageal cancer Maternal Uncle   . Rectal cancer Maternal Uncle   . Diabetes Maternal Grandmother   . Heart disease Maternal Grandmother     SH:  Married, non smoker  Review of Systems  Genitourinary:       Unscheduled bleeding  Vulvar itching Bleeding with intercourse  All other systems reviewed and are negative.   PHYSICAL EXAMINATION:     BP 112/70 (BP Location: Right Arm, Patient Position: Sitting, Cuff Size: Large)   Pulse 80   Resp 16   Ht 5\' 2"  (1.575 m)   Wt 188 lb 9.6 oz (85.5 kg)   LMP 07/23/2018   BMI 34.50 kg/m     General appearance: alert, cooperative and appears stated age Lymph:  no inguinal LAD noted  Pelvic: External genitalia:  no lesions              Urethra:  normal appearing urethra with no masses, tenderness or lesions              Bartholins and Skenes: normal                 Vagina: normal appearing vagina with normal color and discharge, no lesions              Cervix: no lesions and 1cm IUD string noted              Bimanual Exam:  Uterus:  normal size, contour, position, consistency, mobility, non-tender              Adnexa: no mass, fullness, tenderness  Chaperone was present for exam.  Assessment: DUB after Mirena IUD placement Vaginal odor Rectocele  Plan: Affirm pending Options discussed for bleeding treatment including ablation and hysterectomy.  She is considering both of these options if she does not have significant improvement in her bleeding.  ~20 minutes spent with patient >50% of time was in face to face discussion of above.

## 2018-08-10 LAB — VAGINITIS/VAGINOSIS, DNA PROBE
Candida Species: NEGATIVE
Gardnerella vaginalis: NEGATIVE
TRICHOMONAS VAG: NEGATIVE

## 2018-08-11 DIAGNOSIS — M533 Sacrococcygeal disorders, not elsewhere classified: Secondary | ICD-10-CM | POA: Diagnosis not present

## 2018-08-11 DIAGNOSIS — L409 Psoriasis, unspecified: Secondary | ICD-10-CM | POA: Diagnosis not present

## 2018-08-11 DIAGNOSIS — R768 Other specified abnormal immunological findings in serum: Secondary | ICD-10-CM | POA: Diagnosis not present

## 2018-08-11 DIAGNOSIS — M2550 Pain in unspecified joint: Secondary | ICD-10-CM | POA: Diagnosis not present

## 2018-08-12 ENCOUNTER — Other Ambulatory Visit: Payer: Self-pay | Admitting: *Deleted

## 2018-08-12 MED ORDER — METRONIDAZOLE 500 MG PO TABS: 500.0000 mg | ORAL_TABLET | Freq: Two times a day (BID) | ORAL | 0 refills | Status: AC

## 2018-08-26 DIAGNOSIS — F419 Anxiety disorder, unspecified: Secondary | ICD-10-CM | POA: Diagnosis not present

## 2018-08-26 DIAGNOSIS — F900 Attention-deficit hyperactivity disorder, predominantly inattentive type: Secondary | ICD-10-CM | POA: Diagnosis not present

## 2018-08-26 DIAGNOSIS — F338 Other recurrent depressive disorders: Secondary | ICD-10-CM | POA: Diagnosis not present

## 2018-08-26 DIAGNOSIS — Z79899 Other long term (current) drug therapy: Secondary | ICD-10-CM | POA: Diagnosis not present

## 2018-09-05 NOTE — Progress Notes (Signed)
[name] [address], [place] [address] Phone: [phone] Subjective:    I Kandace Blitz am serving as a Education administrator for Dr. Hulan Saas.   I'm seeing this patient by the request  of:    CC: Right hip pain follow-up  EUM:[id number]  Sheril Hammond is a 42 y.o. female coming in with complaint of right hip pain. States that her hip is not doing well. Last piriformis injection did not last. Right leg kenetic chain pain. She feels that she is where she started. She is hesitant about injections.   Patient did do very well with a injection in the piriformis long time ago.  Started having pain again.  States the pain is severe.  Discussed with activities of doing which was to avoid.  Patient has been trying to do this on a regular basis but finding it difficult secondary to discomfort and pain though     Past Medical History:  Diagnosis Date  . Anxiety   . Attention deficit hyperactivity disorder (ADHD) 02/16/2017   Sees [facility] for managment  . Calculus of kidney 06/07/2012   Overview:  Dr Karsten Ro - urology   . Depression   . Factor V Leiden carrier ([place])    heterozygous  . Hernia, rectovaginal 06/07/2012   Overview:  Dr Corinna Capra - gyn   . History of abnormal cervical Pap smear 1997   dysplasia- LEEP  . Migraine   . Ovarian cyst 1994  . Psoriasis 02/16/2017   -sees dermatologist  . Rectocele    Past Surgical History:  Procedure Laterality Date  . CERVICAL BIOPSY  W/ LOOP ELECTRODE EXCISION    . CESAREAN SECTION  2007 & 2010  . OVARIAN CYST SURGERY  1994  . TUBAL LIGATION  2010  . VULVA /PERINEUM BIOPSY  01/2015   Social History   Socioeconomic History  . Marital status: Married    Spouse name: Not on file  . Number of children: Not on file  . Years of education: Not on file  . Highest education level: Not on file  Occupational History  . Not on file  Social Needs  . Financial resource strain: Not on file    . Food insecurity:    Worry: Not on file    Inability: Not on file  . Transportation needs:    Medical: Not on file    Non-medical: Not on file  Tobacco Use  . Smoking status: Never Smoker  . Smokeless tobacco: Never Used  Substance and Sexual Activity  . Alcohol use: Yes    Alcohol/week: 5.0 standard drinks    Types: 5 Standard drinks or equivalent per week  . Drug use: No  . Sexual activity: Yes    Partners: Male    Birth control/protection: Surgical, IUD    Comment: Mirena placed 06/02/18  Lifestyle  . Physical activity:    Days per week: Not on file    Minutes per session: Not on file  . Stress: Not on file  Relationships  . Social connections:    Talks on phone: Not on file    Gets together: Not on file    Attends religious service: Not on file    Active member of club or organization: Not on file    Attends meetings of clubs or organizations: Not on file    Relationship status: Not on file  Other Topics Concern  . Not on file  Social History Narrative  Work or School: homemaker      Home Situation: 2 children      Spiritual Beliefs:      Lifestyle: regular exercise   No Known Allergies Family History  Problem Relation Age of Onset  . Esophageal cancer Father   . Esophageal cancer Maternal Uncle   . Rectal cancer Maternal Uncle   . Diabetes Maternal Grandmother   . Heart disease Maternal Grandmother        Current Outpatient Medications (Analgesics):  .  rizatriptan (MAXALT) 10 MG tablet, Take 10 mg by mouth as needed for migraine. May repeat in 2 hours if needed .  naproxen sodium (ANAPROX) 550 MG tablet, daily.   Current Outpatient Medications (Other):  Marland Kitchen  BEPREVE 1.5 % SOLN,  .  Ixekizumab (TALTZ [place]), Inject into the skin. Marland Kitchen  METRONIDAZOLE, TOPICAL, 0.75 % LOTN, daily. .  Multiple Vitamin (MULTI-VITAMINS) TABS, Take by mouth daily. Marland Kitchen  MYDAYIS 25 MG CP24, Take 1 capsule by mouth daily. .  Probiotic Product (PROBIOTIC DAILY) CAPS, Take by  mouth daily. Marland Kitchen  tretinoin (RETIN-A) 0.05 % cream, daily. Marland Kitchen  venlafaxine XR (EFFEXOR XR) 37.5 MG 24 hr capsule, Take 1 capsule (37.5 mg total) by mouth daily with breakfast. .  doxycycline (VIBRA-TABS) 100 MG tablet, Take 1 tablet by mouth daily.    Past medical history, social, surgical and family history all reviewed in electronic medical record.  No pertanent information unless stated regarding to the chief complaint.   Review of Systems:  No headache, visual changes, nausea, vomiting, diarrhea, constipation, dizziness, abdominal pain, skin rash, fevers, chills, night sweats, weight loss, swollen lymph nodes,chest pain, shortness of breath, mood changes.  Positive muscle aches, body aches  Objective  Blood pressure 110/80, pulse 86, height 5\' 2"  (1.575 m), weight 186 lb (84.4 kg), SpO2 98 %.   General: No apparent distress alert and oriented x3 mood and affect normal, dressed appropriately.  HEENT: Pupils equal, extraocular movements intact  Respiratory: Patient's speak in full sentences and does not appear short of breath  Cardiovascular: No lower extremity edema, non tender, no erythema  Skin: Warm dry intact with no signs of infection or rash on extremities or on axial skeleton.  Abdomen: Soft nontender  Neuro: Cranial nerves II through XII are intact, neurovascularly intact in all extremities with 2+ DTRs and 2+ pulses.  Lymph: No lymphadenopathy of posterior or anterior cervical chain or axillae bilaterally.  Gait normal with good balance and coordination.  MSK:  Non tender with full range of motion and good stability and symmetric strength and tone of shoulders, elbows, wrist, hip, knee and ankles bilaterally.  Back Exam:  Inspection: Unremarkable  Motion: Flexion 45 deg, Extension 25 deg, Side Bending to 45 deg bilaterally,  Rotation to 45 deg bilaterally  SLR laying: Negative  XSLR laying: Negative  Palpable tenderness: Positive Faber right.  Pain over the piriformis  severe FABER: Positive. Sensory change: Gross sensation intact to all lumbar and sacral dermatomes.  Reflexes: 2+ at both patellar tendons, 2+ at achilles tendons, Babinski's downgoing.  Strength at foot  Plantar-flexion: 5/5 Dorsi-flexion: 5/5 Eversion: 5/5 Inversion: 5/5  Leg strength  Quad: 5/5 Hamstring: 5/5 Hip flexor: 5/5 Hip abductors: 5/5  Gait unremarkable.   Procedure: Real-time Ultrasound Guided Injection of right piriformis tendon sheath Device: GE Logiq Q7 Ultrasound guided injection is preferred based studies that show increased duration, increased effect, greater accuracy, decreased procedural pain, increased response rate, and decreased cost with ultrasound guided versus blind injection.  Verbal informed consent obtained.  Time-out conducted.  Noted no overlying erythema, induration, or other signs of local infection.  Skin prepped in a sterile fashion.  Local anesthesia: Topical Ethyl chloride.  With sterile technique and under real time ultrasound guidance: With a 25-gauge half inch needle patient was injected with 0.5 cc of 0.5% Marcaine and 1 cc of Kenalog 40 mg/mL into the right piriformis tendon sheath Completed without difficulty  Pain immediately resolved suggesting accurate placement of the medication.  Advised to call if fevers/chills, erythema, induration, drainage, or persistent bleeding.  Images permanently stored and available for review in the ultrasound unit.  Impression: Technically successful ultrasound guided injection. Impression and Recommendations:     This case required medical decision making of moderate complexity. The above documentation has been reviewed and is accurate and complete Lyndal Pulley, DO       Note: This dictation was prepared with Dragon dictation along with smaller phrase technology. Any transcriptional errors that result from this process are unintentional.

## 2018-09-06 ENCOUNTER — Ambulatory Visit: Payer: Self-pay

## 2018-09-06 ENCOUNTER — Encounter: Payer: Self-pay | Admitting: Family Medicine

## 2018-09-06 ENCOUNTER — Ambulatory Visit: Payer: BLUE CROSS/BLUE SHIELD | Admitting: Family Medicine

## 2018-09-06 VITALS — BP 110/80 | HR 86 | Ht 62.0 in | Wt 186.0 lb

## 2018-09-06 DIAGNOSIS — G5701 Lesion of sciatic nerve, right lower limb: Secondary | ICD-10-CM | POA: Diagnosis not present

## 2018-09-06 DIAGNOSIS — M25551 Pain in right hip: Secondary | ICD-10-CM

## 2018-09-06 NOTE — Patient Instructions (Signed)
Good to see you I would add vitamin D 1000 IU dialy  Calcium pyruvate 1500mg  daily but will take 2 months Tried injection of piriformis today  I want to see you again in 3-4 weeks and may need to inject SI join or look at other things as well  Be patient but I am optimistic we will get you better

## 2018-09-06 NOTE — Assessment & Plan Note (Addendum)
Patient given injection.  Procedure well.  Discussed icing regimen and home exercise.  Discussed which activities to do which wants to avoid.  Differential includes lumbar radiculopathy.  MRI was fairly unremarkable except for mild degenerative disc disease and can try epidural for diagnostic and potentially therapeutic purposes.  Discussed icing regimen.  Follow-up again in 3 to 4 weeks. Spent  25 minutes with patient face-to-face and had greater than 50% of counseling including as described above in assessment and plan.

## 2018-09-20 ENCOUNTER — Other Ambulatory Visit: Payer: Self-pay | Admitting: Family Medicine

## 2018-09-20 DIAGNOSIS — L570 Actinic keratosis: Secondary | ICD-10-CM | POA: Diagnosis not present

## 2018-09-20 DIAGNOSIS — Z85820 Personal history of malignant melanoma of skin: Secondary | ICD-10-CM | POA: Diagnosis not present

## 2018-09-20 DIAGNOSIS — D225 Melanocytic nevi of trunk: Secondary | ICD-10-CM | POA: Diagnosis not present

## 2018-09-20 DIAGNOSIS — L700 Acne vulgaris: Secondary | ICD-10-CM | POA: Diagnosis not present

## 2018-09-20 DIAGNOSIS — L400 Psoriasis vulgaris: Secondary | ICD-10-CM | POA: Diagnosis not present

## 2018-09-20 NOTE — Telephone Encounter (Signed)
Refill done.  

## 2018-09-29 ENCOUNTER — Ambulatory Visit: Payer: BLUE CROSS/BLUE SHIELD | Admitting: Family Medicine

## 2018-09-29 DIAGNOSIS — L409 Psoriasis, unspecified: Secondary | ICD-10-CM | POA: Diagnosis not present

## 2018-09-29 DIAGNOSIS — M533 Sacrococcygeal disorders, not elsewhere classified: Secondary | ICD-10-CM | POA: Diagnosis not present

## 2018-09-29 DIAGNOSIS — R768 Other specified abnormal immunological findings in serum: Secondary | ICD-10-CM | POA: Diagnosis not present

## 2018-09-29 DIAGNOSIS — M2550 Pain in unspecified joint: Secondary | ICD-10-CM | POA: Diagnosis not present

## 2018-09-29 DIAGNOSIS — Z23 Encounter for immunization: Secondary | ICD-10-CM | POA: Diagnosis not present

## 2018-10-11 ENCOUNTER — Telehealth: Payer: Self-pay | Admitting: Obstetrics & Gynecology

## 2018-10-11 NOTE — Telephone Encounter (Signed)
Patient is calling regarding irregular bleeding with her IUD 6 months after insertion. Patient states this is not urgent and she be called back tomorrow.

## 2018-10-11 NOTE — Telephone Encounter (Signed)
Spoke with patient. Patient has Mirena IUD placed 06/02/18 for DUB and BTL for contraception. Patient reports daily spotting and a monthly cycle.  Hx of rectocele. Patient request OV to discuss surgical treatment options. Patient denies heavy bleeding or any other GYN symptoms.   OV scheduled for 10/21/18 at 11:30am with [name].   Routing to provider for final review. Patient is agreeable to disposition. Will close encounter.

## 2018-10-21 ENCOUNTER — Encounter: Payer: Self-pay | Admitting: Obstetrics & Gynecology

## 2018-10-21 ENCOUNTER — Other Ambulatory Visit: Payer: Self-pay

## 2018-10-21 ENCOUNTER — Ambulatory Visit: Payer: BLUE CROSS/BLUE SHIELD | Admitting: Obstetrics & Gynecology

## 2018-10-21 VITALS — BP 112/70 | HR 92 | Resp 16 | Ht 62.0 in | Wt 182.4 lb

## 2018-10-21 DIAGNOSIS — Z975 Presence of (intrauterine) contraceptive device: Secondary | ICD-10-CM | POA: Diagnosis not present

## 2018-10-21 DIAGNOSIS — N926 Irregular menstruation, unspecified: Secondary | ICD-10-CM | POA: Diagnosis not present

## 2018-10-21 DIAGNOSIS — N809 Endometriosis, unspecified: Secondary | ICD-10-CM

## 2018-10-21 DIAGNOSIS — N8003 Adenomyosis of the uterus: Secondary | ICD-10-CM

## 2018-10-21 DIAGNOSIS — N800 Endometriosis of uterus: Secondary | ICD-10-CM

## 2018-10-21 DIAGNOSIS — Z8742 Personal history of other diseases of the female genital tract: Secondary | ICD-10-CM | POA: Diagnosis not present

## 2018-10-21 LAB — POCT URINE PREGNANCY: Preg Test, Ur: NEGATIVE

## 2018-10-21 NOTE — Progress Notes (Signed)
GYNECOLOGY  VISIT  CC:   Irregular bleeding and discussion of surgical options  HPI: 42 y.o. G72P2002 Married White or Caucasian female here for irregular bleeding and surgery options.  Continues to have irregular spotting as well as a four day cycle that she still needs to use super tampons.  Although bleeding is improved, it is not drastically improved.  She had a pelvic MRI 04/12/18 done for additional right leg weakness and numbness and evaluation for lumbar radiculopathy.  There was an incidental finding of suspected adenomyosis.    She and I have discussed endometrial ablation and hysterectomy with the past.  However with the presence of adenomyosis on MRI, failure rate of this procedure is much higher.  She understands that adenomyosis is a significant cause of failure for endometrial ablations.    She also has a cystocele and rectocele that has been bothersome to her even before the birth of her first child.  She does have some issues at time with defecation as she feels she does not fully evacuate.  Has considered having this repaired in the past as well.  Exams have shown grade 2 but, per hx, her symptoms sound like grade 4 as she can feel a bulge past the vaginal introitus.    Hysterectomy discussed with pt.  Surgery, risks, benefits and alternatives reviewed.  Pt aware additional surgeon is needed for this procedure.  Would like pt see Dr. Quincy Simmonds prior to procedure for additional evaluation and recommendations about treatment for pelvic relaxation symptoms.    GYNECOLOGIC HISTORY: Patient's last menstrual period was 10/12/2018 (exact date). Contraception: BTL Menopausal hormone therapy: none  Patient Active Problem List   Diagnosis Date Noted  . Patellofemoral arthritis of right knee 05/11/2018  . Lumbar radiculopathy 03/22/2018  . Nonallopathic lesion of thoracic region 10/07/2017  . Nonallopathic lesion of cervical region 10/07/2017  . Nonallopathic lesion of lumbosacral region  10/07/2017  . Piriformis syndrome of right side 08/24/2017  . Polyarthropathy or polyarthritis of multiple sites 08/24/2017  . Closed right tarsal navicular fracture 08/02/2017  . 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 Mild episode of recurrent major depressive disorder ([place]) 02/16/2017  . Attention deficit hyperactivity disorder (ADHD) 02/16/2017  . Flatulence 02/16/2017  . BMI 33.0-33.9,adult 02/16/2017  . Psoriasis 02/16/2017  . Heterozygous factor V Leiden mutation ([place]) 09/01/2016  . Headache, menstrual migraine 06/07/2012  . Malignant melanoma ([place]) 06/07/2012  . Dysmenorrhea 06/07/2012    Past Medical History:  Diagnosis Date  . Anxiety   . Attention deficit hyperactivity disorder (ADHD) 02/16/2017   Sees [facility] for managment  . Calculus of kidney 06/07/2012   Overview:  Dr Karsten Ro - urology   . Depression   . Factor V Leiden carrier ([place])    heterozygous  . Hernia, rectovaginal 06/07/2012   Overview:  Dr Corinna Capra - gyn   . History of abnormal cervical Pap smear 1997   dysplasia- LEEP  . Migraine   . Ovarian cyst 1994  . Psoriasis 02/16/2017   -sees dermatologist  . Rectocele     Past Surgical History:  Procedure Laterality Date  . CERVICAL BIOPSY  W/ LOOP ELECTRODE EXCISION    . CESAREAN SECTION  2007 & 2010  . OVARIAN CYST SURGERY  1994  . TUBAL LIGATION  2010  . VULVA /PERINEUM BIOPSY  01/2015    MEDS:   Current Outpatient Medications on File Prior to Visit  Medication Sig Dispense Refill  . BEPREVE 1.5 %  SOLN   5  . Ixekizumab (TALTZ [place]) Inject into the skin.    Marland Kitchen METRONIDAZOLE, TOPICAL, 0.75 % LOTN daily.  1  . Multiple Vitamin (MULTI-VITAMINS) TABS Take by mouth daily.    Marland Kitchen MYDAYIS 37.5 MG CP24 Take 1 capsule by mouth daily.  0  . Probiotic Product (PROBIOTIC DAILY) CAPS Take by mouth daily.    . rizatriptan (MAXALT) 10 MG tablet Take 10 mg by mouth as needed for migraine. May repeat in 2 hours if needed    .  tretinoin (RETIN-A) 0.05 % cream daily.  2  . venlafaxine XR (EFFEXOR-XR) 37.5 MG 24 hr capsule TAKE 1 CAPSULE DAILY WITH BREAKFAST. 90 capsule 1   No current facility-administered medications on file prior to visit.     ALLERGIES: Patient has no known allergies.  Family History  Problem Relation Age of Onset  . Esophageal cancer Father   . Esophageal cancer Maternal Uncle   . Rectal cancer Maternal Uncle   . Diabetes Maternal Grandmother   . Heart disease Maternal Grandmother     SH:  Married, non smoker  Review of Systems  Genitourinary:       Unscheduled bleeding   All other systems reviewed and are negative.   PHYSICAL EXAMINATION:    BP 112/70 (BP Location: Right Arm, Patient Position: Sitting, Cuff Size: Large)   Pulse 92   Resp 16   Ht 5\' 2"  (1.575 m)   Wt 182 lb 6.4 oz (82.7 kg)   LMP 10/12/2018 (Exact Date)   BMI 33.36 kg/m     General appearance: alert, cooperative and appears stated age No additional exam performed today.  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 Quincy Simmonds for consideration of combined procedure.  We did look at dates today and December 30th or 31st would be her preference.     ~25 minutes spent with patient >50% of time was in face to face discussion of above.

## 2018-10-24 ENCOUNTER — Encounter: Payer: Self-pay | Admitting: Obstetrics and Gynecology

## 2018-10-24 ENCOUNTER — Ambulatory Visit: Payer: BLUE CROSS/BLUE SHIELD | Admitting: Obstetrics and Gynecology

## 2018-10-24 ENCOUNTER — Other Ambulatory Visit: Payer: Self-pay

## 2018-10-24 VITALS — BP 110/64 | HR 88 | Ht 62.0 in | Wt 186.0 lb

## 2018-10-24 DIAGNOSIS — N812 Incomplete uterovaginal prolapse: Secondary | ICD-10-CM

## 2018-10-24 DIAGNOSIS — N3946 Mixed incontinence: Secondary | ICD-10-CM | POA: Diagnosis not present

## 2018-10-24 NOTE — Progress Notes (Signed)
GYNECOLOGY  VISIT   HPI: 42 y.o.   Married  Caucasian  female   G2P2002 with Patient's last menstrual period was 10/12/2018 (exact date).   here for evaluation of rectocele.  Referred by [name].   Had a rectocele since prior to childbearing.  Prior patient of Dr. Sander Radon.  Had 2 C/S.  Did pelvic floor therapy at [facility] with Hart Robinsons and Opal Sidles.  Feels a protrusion with BMs.  Has tried vaginal splinting, which was not successful. States her BMs are soft.  Does not use stool softeners. Can have BMs multiple times a day.  No fecal incontinence.  After a BM, does not feel rectocele anymore.    Does not feel the rectocele when she works out.  Does not have pain.   Has leaked urine. Leak with cough, laugh, sneeze, exercise.  Empties often to avoid leakage. Has urinary urgency with a full bladder.  No pad use.   DF - every 30 - 60 min in the am, every 2 hours in the pm. NF - none.  No enuresis.   No hematuria, UTIs. Hx renal stones.   Has a Mirena IUD since June for bleeding issues.  Spotting significantly, more days out of the month than not. Feels the IUD low in the vagina.   Had an MRI and noted to have adenomyosis.   States her bleeding is more annoying than her rectocele.  Has 2 children.   GYNECOLOGIC HISTORY: Patient's last menstrual period was 10/12/2018 (exact date). Contraception: Mirena IUD 06-02-18  Menopausal hormone therapy:  None Last mammogram: 11-03-18 Neg/density B/BiRads1 Last pap smear: 05-10-18 Neg, 01-14-16 Neg:Neg HR HPV        OB History    Gravida  2   Para  2   Term  2   Preterm  0   AB  0   Living  2     SAB  0   TAB  0   Ectopic  0   Multiple  0   Live Births  2              Patient Active Problem List   Diagnosis Date Noted  . Patellofemoral arthritis of right knee 05/11/2018  . Lumbar radiculopathy 03/22/2018  . Nonallopathic lesion of thoracic region 10/07/2017  . Nonallopathic lesion of cervical  region 10/07/2017  . Nonallopathic lesion of lumbosacral region 10/07/2017  . Piriformis syndrome of right side 08/24/2017  . Polyarthropathy or polyarthritis of multiple sites 08/24/2017  . Closed right tarsal navicular fracture 08/02/2017  . 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 Mild episode of recurrent major depressive disorder ([place]) 02/16/2017  . Attention deficit hyperactivity disorder (ADHD) 02/16/2017  . Flatulence 02/16/2017  . BMI 33.0-33.9,adult 02/16/2017  . Psoriasis 02/16/2017  . Heterozygous factor V Leiden mutation ([place]) 09/01/2016  . Headache, menstrual migraine 06/07/2012  . Malignant melanoma ([place]) 06/07/2012  . Dysmenorrhea 06/07/2012    Past Medical History:  Diagnosis Date  . Anxiety   . Attention deficit hyperactivity disorder (ADHD) 02/16/2017   Sees [facility] for managment  . Calculus of kidney 06/07/2012   Overview:  Dr Karsten Ro - urology   . Depression   . Factor V Leiden carrier ([place])    heterozygous  . Hernia, rectovaginal 06/07/2012   Overview:  Dr Corinna Capra - gyn   . History of abnormal cervical Pap smear 1997   dysplasia- LEEP  . Migraine   . Ovarian cyst 1994  .  Psoriasis 02/16/2017   -sees dermatologist  . Rectocele     Past Surgical History:  Procedure Laterality Date  . CERVICAL BIOPSY  W/ LOOP ELECTRODE EXCISION    . CESAREAN SECTION  2007 & 2010  . OVARIAN CYST SURGERY  1994  . TUBAL LIGATION  2010  . VULVA /PERINEUM BIOPSY  01/2015    Current Outpatient Medications  Medication Sig Dispense Refill  . BEPREVE 1.5 % SOLN   5  . Ixekizumab (TALTZ [place]) Inject into the skin.    Marland Kitchen METRONIDAZOLE, TOPICAL, 0.75 % LOTN daily.  1  . Multiple Vitamin (MULTI-VITAMINS) TABS Take by mouth daily.    Marland Kitchen MYDAYIS 37.5 MG CP24 Take 1 capsule by mouth daily.  0  . Probiotic Product (PROBIOTIC DAILY) CAPS Take by mouth daily.    . rizatriptan (MAXALT) 10 MG tablet Take 10 mg by mouth as needed for migraine. May repeat  in 2 hours if needed    . tretinoin (RETIN-A) 0.05 % cream daily.  2  . venlafaxine XR (EFFEXOR-XR) 37.5 MG 24 hr capsule TAKE 1 CAPSULE DAILY WITH BREAKFAST. 90 capsule 1   No current facility-administered medications for this visit.      ALLERGIES: Patient has no known allergies.  Family History  Problem Relation Age of Onset  . Esophageal cancer Father   . Esophageal cancer Maternal Uncle   . Rectal cancer Maternal Uncle   . Diabetes Maternal Grandmother   . Heart disease Maternal Grandmother     Social History   Socioeconomic History  . Marital status: Married    Spouse name: Not on file  . Number of children: Not on file  . Years of education: Not on file  . Highest education level: Not on file  Occupational History  . Not on file  Social Needs  . Financial resource strain: Not on file  . Food insecurity:    Worry: Not on file    Inability: Not on file  . Transportation needs:    Medical: Not on file    Non-medical: Not on file  Tobacco Use  . Smoking status: Never Smoker  . Smokeless tobacco: Never Used  Substance and Sexual Activity  . Alcohol use: Yes    Alcohol/week: 5.0 standard drinks    Types: 5 Standard drinks or equivalent per week  . Drug use: No  . Sexual activity: Yes    Partners: Male    Birth control/protection: Surgical, IUD    Comment: Mirena placed 06/02/18  Lifestyle  . Physical activity:    Days per week: Not on file    Minutes per session: Not on file  . Stress: Not on file  Relationships  . Social connections:    Talks on phone: Not on file    Gets together: Not on file    Attends religious service: Not on file    Active member of club or organization: Not on file    Attends meetings of clubs or organizations: Not on file    Relationship status: Not on file  . Intimate partner violence:    Fear of current or ex partner: Not on file    Emotionally abused: Not on file    Physically abused: Not on file    Forced sexual activity:  Not on file  Other Topics Concern  . Not on file  Social History Narrative   Work or School: homemaker      Home Situation: 2 children      Spiritual  Beliefs:      Lifestyle: regular exercise    Review of Systems  All other systems reviewed and are negative.   PHYSICAL EXAMINATION:    BP 110/64 (BP Location: Right Arm, Patient Position: Sitting, Cuff Size: Large)   Pulse 88   Ht 5\' 2"  (1.575 m)   Wt 186 lb (84.4 kg)   LMP 10/12/2018 (Exact Date)   BMI 34.02 kg/m     General appearance: alert, cooperative and appears stated age  Pelvic: External genitalia:  no lesions              Urethra:  normal appearing urethra with no masses, tenderness or lesions              Bartholins and Skenes: normal                 Vagina: normal appearing vagina with normal color and discharge, no lesions.  First degree uterine prolapse.  Minimal cystocele, second degree rectocele.              Cervix: no lesions.IUD strings noted.                Bimanual Exam:  Uterus:  normal size, contour, position, consistency, mobility, non-tender              Adnexa: no mass, fullness, tenderness              Rectal exam: Yes.  .  Confirms.              Anus:  normal sphincter tone, no lesions  Chaperone was present for exam.  ASSESSMENT  Heterozygous for factor V Leiden.  No prior DVT or PE. Incomplete uterovaginal prolapse. Rectocele.  Mixed incontinence.  Abnormal uterine bleeding.  Mirena IUD.  Adenomyosis by MRI.   PLAN  We had a comprehensive discussion regarding uterovaginal prolapse - risk factors, symptoms, degrees of prolapse, and treatment options including observation, PT, and surgical care.  If she chooses to proceed with surgery, a rectocele with native tissue repair and potential midurethral sling with cystoscopy can accompany a hysterectomy procedure performed by [name]. We discussed potential risks of surgery which include but are not limited to bleeding, infection, damage  to surrounding organs, DVT, PE, reaction to anesthesia, mesh exposure/erosion, urinary retention, overactive bladder symptoms, slower voiding, and peroperative urinary tract infection.  Urodynamics would need to proceed surgery.  ACOG HO on prolapse and incontinence to patient. She will consider her options and contact the office back with her choice. She is leaning against surgery at this time.   An After Visit Summary was printed and given to the patient.  __40____ minutes face to face time of which over 50% was spent in counseling.

## 2018-10-27 ENCOUNTER — Telehealth: Payer: Self-pay | Admitting: Obstetrics & Gynecology

## 2018-10-27 NOTE — Telephone Encounter (Signed)
Routing to Dr Sabra Heck and Dr Quincy Simmonds. FYI.  Encounter closed.

## 2018-10-27 NOTE — Telephone Encounter (Signed)
Patient has decided to postpone having surgery and is no longer interested the date at the end of the month. No need to return her call unless you have questions.

## 2018-10-31 DIAGNOSIS — M25561 Pain in right knee: Secondary | ICD-10-CM | POA: Diagnosis not present

## 2018-10-31 DIAGNOSIS — M25551 Pain in right hip: Secondary | ICD-10-CM | POA: Diagnosis not present

## 2018-11-18 DIAGNOSIS — D2239 Melanocytic nevi of other parts of face: Secondary | ICD-10-CM | POA: Diagnosis not present

## 2018-11-18 DIAGNOSIS — L700 Acne vulgaris: Secondary | ICD-10-CM | POA: Diagnosis not present

## 2018-11-18 DIAGNOSIS — Z85820 Personal history of malignant melanoma of skin: Secondary | ICD-10-CM | POA: Diagnosis not present

## 2018-11-25 DIAGNOSIS — F419 Anxiety disorder, unspecified: Secondary | ICD-10-CM | POA: Diagnosis not present

## 2018-11-25 DIAGNOSIS — F900 Attention-deficit hyperactivity disorder, predominantly inattentive type: Secondary | ICD-10-CM | POA: Diagnosis not present

## 2018-11-25 DIAGNOSIS — F338 Other recurrent depressive disorders: Secondary | ICD-10-CM | POA: Diagnosis not present

## 2018-11-25 DIAGNOSIS — Z79899 Other long term (current) drug therapy: Secondary | ICD-10-CM | POA: Diagnosis not present

## 2018-12-20 DIAGNOSIS — R51 Headache: Secondary | ICD-10-CM | POA: Diagnosis not present

## 2018-12-28 ENCOUNTER — Other Ambulatory Visit: Payer: Self-pay | Admitting: Obstetrics & Gynecology

## 2018-12-28 DIAGNOSIS — Z1231 Encounter for screening mammogram for malignant neoplasm of breast: Secondary | ICD-10-CM

## 2018-12-30 DIAGNOSIS — M2550 Pain in unspecified joint: Secondary | ICD-10-CM | POA: Diagnosis not present

## 2018-12-30 DIAGNOSIS — R768 Other specified abnormal immunological findings in serum: Secondary | ICD-10-CM | POA: Diagnosis not present

## 2018-12-30 DIAGNOSIS — L4050 Arthropathic psoriasis, unspecified: Secondary | ICD-10-CM | POA: Diagnosis not present

## 2018-12-30 DIAGNOSIS — L409 Psoriasis, unspecified: Secondary | ICD-10-CM | POA: Diagnosis not present

## 2019-01-27 ENCOUNTER — Ambulatory Visit
Admission: RE | Admit: 2019-01-27 | Discharge: 2019-01-27 | Disposition: A | Discharge status: Home / Self Care | Payer: BLUE CROSS/BLUE SHIELD | Source: Ambulatory Visit | Attending: Obstetrics & Gynecology | Admitting: Obstetrics & Gynecology

## 2019-01-27 DIAGNOSIS — Z1231 Encounter for screening mammogram for malignant neoplasm of breast: Secondary | ICD-10-CM | POA: Diagnosis not present

## 2019-03-01 DIAGNOSIS — F900 Attention-deficit hyperactivity disorder, predominantly inattentive type: Secondary | ICD-10-CM | POA: Diagnosis not present

## 2019-03-01 DIAGNOSIS — Z79899 Other long term (current) drug therapy: Secondary | ICD-10-CM | POA: Diagnosis not present

## 2019-03-01 DIAGNOSIS — F419 Anxiety disorder, unspecified: Secondary | ICD-10-CM | POA: Diagnosis not present

## 2019-03-21 DIAGNOSIS — D225 Melanocytic nevi of trunk: Secondary | ICD-10-CM | POA: Diagnosis not present

## 2019-03-21 DIAGNOSIS — D2261 Melanocytic nevi of right upper limb, including shoulder: Secondary | ICD-10-CM | POA: Diagnosis not present

## 2019-03-21 DIAGNOSIS — Z85820 Personal history of malignant melanoma of skin: Secondary | ICD-10-CM | POA: Diagnosis not present

## 2019-03-21 DIAGNOSIS — L700 Acne vulgaris: Secondary | ICD-10-CM | POA: Diagnosis not present

## 2019-03-30 DIAGNOSIS — R768 Other specified abnormal immunological findings in serum: Secondary | ICD-10-CM | POA: Diagnosis not present

## 2019-03-30 DIAGNOSIS — L4050 Arthropathic psoriasis, unspecified: Secondary | ICD-10-CM | POA: Diagnosis not present

## 2019-03-30 DIAGNOSIS — L409 Psoriasis, unspecified: Secondary | ICD-10-CM | POA: Diagnosis not present

## 2019-03-30 DIAGNOSIS — M2550 Pain in unspecified joint: Secondary | ICD-10-CM | POA: Diagnosis not present

## 2019-04-13 DIAGNOSIS — H10413 Chronic giant papillary conjunctivitis, bilateral: Secondary | ICD-10-CM | POA: Diagnosis not present

## 2019-04-13 DIAGNOSIS — H5213 Myopia, bilateral: Secondary | ICD-10-CM | POA: Diagnosis not present

## 2019-04-13 DIAGNOSIS — H04123 Dry eye syndrome of bilateral lacrimal glands: Secondary | ICD-10-CM | POA: Diagnosis not present

## 2019-06-01 ENCOUNTER — Other Ambulatory Visit: Payer: Self-pay | Admitting: Physician Assistant

## 2019-06-01 DIAGNOSIS — M79671 Pain in right foot: Secondary | ICD-10-CM

## 2019-06-02 ENCOUNTER — Other Ambulatory Visit: Payer: Self-pay

## 2019-06-05 DIAGNOSIS — B369 Superficial mycosis, unspecified: Secondary | ICD-10-CM | POA: Diagnosis not present

## 2019-06-05 DIAGNOSIS — H6242 Otitis externa in other diseases classified elsewhere, left ear: Secondary | ICD-10-CM | POA: Diagnosis not present

## 2019-06-05 DIAGNOSIS — H6122 Impacted cerumen, left ear: Secondary | ICD-10-CM | POA: Diagnosis not present

## 2019-06-05 DIAGNOSIS — J302 Other seasonal allergic rhinitis: Secondary | ICD-10-CM | POA: Diagnosis not present

## 2019-06-06 ENCOUNTER — Other Ambulatory Visit: Payer: Self-pay

## 2019-06-06 ENCOUNTER — Encounter: Payer: Self-pay | Admitting: Obstetrics & Gynecology

## 2019-06-06 ENCOUNTER — Other Ambulatory Visit (HOSPITAL_COMMUNITY)
Admission: RE | Admit: 2019-06-06 | Discharge: 2019-06-06 | Disposition: A | Discharge status: Home / Self Care | Payer: BC Managed Care – PPO | Source: Ambulatory Visit | Attending: Obstetrics & Gynecology | Admitting: Obstetrics & Gynecology

## 2019-06-06 ENCOUNTER — Ambulatory Visit (INDEPENDENT_AMBULATORY_CARE_PROVIDER_SITE_OTHER): Payer: BC Managed Care – PPO | Admitting: Obstetrics & Gynecology

## 2019-06-06 VITALS — BP 118/74 | HR 90 | Temp 97.9°F | Resp 14 | Ht 61.5 in | Wt 168.1 lb

## 2019-06-06 DIAGNOSIS — Z01419 Encounter for gynecological examination (general) (routine) without abnormal findings: Secondary | ICD-10-CM | POA: Diagnosis not present

## 2019-06-06 DIAGNOSIS — Z124 Encounter for screening for malignant neoplasm of cervix: Secondary | ICD-10-CM

## 2019-06-06 MED ORDER — TRIAMCINOLONE ACETONIDE 0.5 % EX OINT: 1.0000 "application " | TOPICAL_OINTMENT | Freq: Two times a day (BID) | CUTANEOUS | 0 refills | Status: DC

## 2019-06-06 NOTE — Progress Notes (Signed)
43 y.o. G57P2002 Married White or Caucasian female here for annual exam.  Still having regular cycles.  She's had two in June that were 13 days apart.  Flow is enough to need tampons and/or pads.  She spotting is very bothersome.  She did see Dr. Quincy Simmonds.  Does needs urodynamics.    Is now seeing a rheumatologist, Marella Chimes, at [facility].  She had a positive ANA.  Has been diagnosed with psoriasis by [name] as well.  Has done more extensive blood work in the last year.  She is now being treated with a monthly injectable medication, (Taltz).  Diagnosis is psoriatic arthritis.    Is having some vulvar itching.  Using some topical cortisone that seems to help.  Hemorrhoids are more bothersome this year.  Does have suppositories and creams that she uses.   Patient's last menstrual period was 05/26/2019.          Sexually active: Yes.    The current method of family planning is tubal ligation.    Exercising: No.  The patient does not participate in regular exercise at present. Smoker:  no  Health Maintenance: Pap:  05/10/18 Neg   01/14/16 Neg. HR HPV:neg  History of abnormal Pap:  Yes, LEEP 1995 MMG:  01/27/19 BIRADS1:neg  Colonoscopy:  never BMD:   never  TDaP:  2013 Screening Labs: PCP   reports that she has never smoked. She has never used smokeless tobacco. She reports current alcohol use of about 5.0 standard drinks of alcohol per week. She reports that she does not use drugs.  Past Medical History:  Diagnosis Date  . Anxiety   . Attention deficit hyperactivity disorder (ADHD) 02/16/2017   Sees [facility] for managment  . Calculus of kidney 06/07/2012   Overview:  Dr Karsten Ro - urology   . Depression   . Factor V Leiden carrier ([place])    heterozygous  . Hernia, rectovaginal 06/07/2012   Overview:  Dr Corinna Capra - gyn   . History of abnormal cervical Pap smear 1997   dysplasia- LEEP  . Migraine   . Ovarian cyst 1994  . Psoriasis 02/16/2017   -sees  dermatologist  . Rectocele     Past Surgical History:  Procedure Laterality Date  . CERVICAL BIOPSY  W/ LOOP ELECTRODE EXCISION    . CESAREAN SECTION  2007 & 2010  . OVARIAN CYST SURGERY  1994  . TUBAL LIGATION  2010  . VULVA /PERINEUM BIOPSY  01/2015    Current Outpatient Medications  Medication Sig Dispense Refill  . Ixekizumab (TALTZ [place]) Inject into the skin.    Marland Kitchen METRONIDAZOLE, TOPICAL, 0.75 % LOTN daily.  1  . MYDAYIS 37.5 MG CP24 Take 1 capsule by mouth daily.  0  . Probiotic Product (PROBIOTIC DAILY) CAPS Take by mouth daily.    . rizatriptan (MAXALT) 10 MG tablet Take 10 mg by mouth as needed for migraine. May repeat in 2 hours if needed    . tretinoin (RETIN-A) 0.05 % cream daily.  2  . venlafaxine XR (EFFEXOR-XR) 37.5 MG 24 hr capsule TAKE 1 CAPSULE DAILY WITH BREAKFAST. 90 capsule 1   No current facility-administered medications for this visit.     Family History  Problem Relation Age of Onset  . Esophageal cancer Father   . Esophageal cancer Maternal Uncle   . Rectal cancer Maternal Uncle   . Diabetes Maternal Grandmother   . Heart disease Maternal Grandmother     Review of Systems  Gastrointestinal:       Hemorrhoids   Genitourinary: Positive for menstrual problem.       Spotting with IUD Irritation    All other systems reviewed and are negative.   Exam:   BP 118/74 (BP Location: Right Arm, Patient Position: Sitting, Cuff Size: Normal)   Pulse 90   Temp 97.9 F (36.6 C) (Temporal)   Resp 14   Ht 5' 1.5" (1.562 m)   Wt 168 lb 1.6 oz (76.2 kg)   LMP 05/26/2019   BMI 31.25 kg/m    Height: 5' 1.5" (156.2 cm)  Ht Readings from Last 3 Encounters:  06/06/19 5' 1.5" (1.562 m)  10/24/18 5\' 2"  (1.575 m)  10/21/18 5\' 2"  (1.575 m)    General appearance: alert, cooperative and appears stated age Head: Normocephalic, without obvious abnormality, atraumatic Neck: no adenopathy, supple, symmetrical, trachea midline and thyroid normal to inspection and  palpation Lungs: clear to auscultation bilaterally Breasts: normal appearance, no masses or tenderness Heart: regular rate and rhythm Abdomen: soft, non-tender; bowel sounds normal; no masses,  no organomegaly Extremities: extremities normal, atraumatic, no cyanosis or edema Skin: Skin color, texture, turgor normal. No rashes or lesions Lymph nodes: Cervical, supraclavicular, and axillary nodes normal. No abnormal inguinal nodes palpated Neurologic: Grossly normal   Pelvic: External genitalia:  no lesions except for a small fissure, to the lateral side of the left labia minora              Urethra:  normal appearing urethra with no masses, tenderness or lesions              Bartholins and Skenes: normal                 Vagina: normal appearing vagina with normal color and discharge, no lesions              Cervix: no lesions              Pap taken: Yes.   Bimanual Exam:  Uterus:  normal size, contour, position, consistency, mobility, non-tender              Adnexa: normal adnexa and no mass, fullness, tenderness               Rectovaginal: Confirms               Anus:  normal sphincter tone, no lesions  Chaperone was present for exam.  A:  Well Woman with normal exam Menorrhagia that is improved with IUD but now with irregular bleeding Adenomyosis on MRI ADHD, seeing Dr. Rachel Moulds H/o melanoma, followed by [name] H/o LEEP 1995 H/o hemorrhoids  P:   Mammogram guidelines reviewed pap smear with HR HPV obtained today Colorectal screening guidelines reviewed She is considering proceeding with hysterectomy, rectocele.  Will need urodynamics prior to scheduling Would also recommend endometrial biopsy prior to surgery Trial of kenalog 0.5% ointment bid for 7-10 days.  She is going to give me an update after that time Return annually or prn

## 2019-06-08 LAB — CYTOLOGY - PAP
Diagnosis: NEGATIVE
HPV: NOT DETECTED

## 2019-06-13 DIAGNOSIS — F900 Attention-deficit hyperactivity disorder, predominantly inattentive type: Secondary | ICD-10-CM | POA: Diagnosis not present

## 2019-06-13 DIAGNOSIS — F419 Anxiety disorder, unspecified: Secondary | ICD-10-CM | POA: Diagnosis not present

## 2019-06-20 DIAGNOSIS — M79671 Pain in right foot: Secondary | ICD-10-CM | POA: Diagnosis not present

## 2019-06-22 DIAGNOSIS — K6289 Other specified diseases of anus and rectum: Secondary | ICD-10-CM | POA: Diagnosis not present

## 2019-06-22 DIAGNOSIS — K600 Acute anal fissure: Secondary | ICD-10-CM | POA: Diagnosis not present

## 2019-06-22 DIAGNOSIS — Z800 Family history of malignant neoplasm of digestive organs: Secondary | ICD-10-CM | POA: Diagnosis not present

## 2019-06-22 DIAGNOSIS — K625 Hemorrhage of anus and rectum: Secondary | ICD-10-CM | POA: Diagnosis not present

## 2019-06-26 ENCOUNTER — Other Ambulatory Visit: Payer: BLUE CROSS/BLUE SHIELD

## 2019-06-29 DIAGNOSIS — L4050 Arthropathic psoriasis, unspecified: Secondary | ICD-10-CM | POA: Diagnosis not present

## 2019-06-29 DIAGNOSIS — M2550 Pain in unspecified joint: Secondary | ICD-10-CM | POA: Diagnosis not present

## 2019-06-29 DIAGNOSIS — R768 Other specified abnormal immunological findings in serum: Secondary | ICD-10-CM | POA: Diagnosis not present

## 2019-06-29 DIAGNOSIS — L409 Psoriasis, unspecified: Secondary | ICD-10-CM | POA: Diagnosis not present

## 2019-06-30 DIAGNOSIS — Z1159 Encounter for screening for other viral diseases: Secondary | ICD-10-CM | POA: Diagnosis not present

## 2019-06-30 DIAGNOSIS — Z0000 Encounter for general adult medical examination without abnormal findings: Secondary | ICD-10-CM | POA: Diagnosis not present

## 2019-07-05 DIAGNOSIS — M7661 Achilles tendinitis, right leg: Secondary | ICD-10-CM | POA: Diagnosis not present

## 2019-07-05 DIAGNOSIS — M722 Plantar fascial fibromatosis: Secondary | ICD-10-CM | POA: Diagnosis not present

## 2019-07-06 DIAGNOSIS — Z0000 Encounter for general adult medical examination without abnormal findings: Secondary | ICD-10-CM | POA: Diagnosis not present

## 2019-07-06 DIAGNOSIS — Z6831 Body mass index (BMI) 31.0-31.9, adult: Secondary | ICD-10-CM | POA: Diagnosis not present

## 2019-07-19 DIAGNOSIS — F331 Major depressive disorder, recurrent, moderate: Secondary | ICD-10-CM | POA: Diagnosis not present

## 2019-07-19 DIAGNOSIS — F411 Generalized anxiety disorder: Secondary | ICD-10-CM | POA: Diagnosis not present

## 2019-07-19 DIAGNOSIS — M549 Dorsalgia, unspecified: Secondary | ICD-10-CM | POA: Diagnosis not present

## 2019-07-19 DIAGNOSIS — G4700 Insomnia, unspecified: Secondary | ICD-10-CM | POA: Diagnosis not present

## 2019-08-08 ENCOUNTER — Other Ambulatory Visit: Payer: Self-pay

## 2019-08-08 DIAGNOSIS — Z20822 Contact with and (suspected) exposure to covid-19: Secondary | ICD-10-CM

## 2019-08-10 LAB — NOVEL CORONAVIRUS, NAA: SARS-CoV-2, NAA: NOT DETECTED

## 2019-08-15 ENCOUNTER — Ambulatory Visit: Payer: BLUE CROSS/BLUE SHIELD | Admitting: Obstetrics & Gynecology

## 2019-08-22 DIAGNOSIS — L700 Acne vulgaris: Secondary | ICD-10-CM | POA: Diagnosis not present

## 2019-08-22 DIAGNOSIS — D2262 Melanocytic nevi of left upper limb, including shoulder: Secondary | ICD-10-CM | POA: Diagnosis not present

## 2019-08-22 DIAGNOSIS — D224 Melanocytic nevi of scalp and neck: Secondary | ICD-10-CM | POA: Diagnosis not present

## 2019-08-22 DIAGNOSIS — Z85820 Personal history of malignant melanoma of skin: Secondary | ICD-10-CM | POA: Diagnosis not present

## 2019-08-31 DIAGNOSIS — Z23 Encounter for immunization: Secondary | ICD-10-CM | POA: Diagnosis not present

## 2019-09-11 DIAGNOSIS — F902 Attention-deficit hyperactivity disorder, combined type: Secondary | ICD-10-CM | POA: Diagnosis not present

## 2019-09-11 DIAGNOSIS — F419 Anxiety disorder, unspecified: Secondary | ICD-10-CM | POA: Diagnosis not present

## 2019-09-11 DIAGNOSIS — Z79899 Other long term (current) drug therapy: Secondary | ICD-10-CM | POA: Diagnosis not present

## 2019-09-11 DIAGNOSIS — F338 Other recurrent depressive disorders: Secondary | ICD-10-CM | POA: Diagnosis not present

## 2019-09-29 DIAGNOSIS — M2550 Pain in unspecified joint: Secondary | ICD-10-CM | POA: Diagnosis not present

## 2019-09-29 DIAGNOSIS — R768 Other specified abnormal immunological findings in serum: Secondary | ICD-10-CM | POA: Diagnosis not present

## 2019-09-29 DIAGNOSIS — L4050 Arthropathic psoriasis, unspecified: Secondary | ICD-10-CM | POA: Diagnosis not present

## 2019-09-29 DIAGNOSIS — L409 Psoriasis, unspecified: Secondary | ICD-10-CM | POA: Diagnosis not present

## 2019-10-17 DIAGNOSIS — R5383 Other fatigue: Secondary | ICD-10-CM | POA: Diagnosis not present

## 2019-10-17 DIAGNOSIS — R635 Abnormal weight gain: Secondary | ICD-10-CM | POA: Diagnosis not present

## 2019-10-19 DIAGNOSIS — G4700 Insomnia, unspecified: Secondary | ICD-10-CM | POA: Diagnosis not present

## 2019-10-19 DIAGNOSIS — G43009 Migraine without aura, not intractable, without status migrainosus: Secondary | ICD-10-CM | POA: Diagnosis not present

## 2019-10-19 DIAGNOSIS — F411 Generalized anxiety disorder: Secondary | ICD-10-CM | POA: Diagnosis not present

## 2019-10-26 DIAGNOSIS — M25551 Pain in right hip: Secondary | ICD-10-CM | POA: Diagnosis not present

## 2019-10-26 DIAGNOSIS — M545 Low back pain: Secondary | ICD-10-CM | POA: Diagnosis not present

## 2019-10-26 DIAGNOSIS — M7061 Trochanteric bursitis, right hip: Secondary | ICD-10-CM | POA: Diagnosis not present

## 2019-10-26 DIAGNOSIS — M25561 Pain in right knee: Secondary | ICD-10-CM | POA: Diagnosis not present

## 2019-12-18 DIAGNOSIS — F900 Attention-deficit hyperactivity disorder, predominantly inattentive type: Secondary | ICD-10-CM | POA: Diagnosis not present

## 2019-12-18 DIAGNOSIS — F902 Attention-deficit hyperactivity disorder, combined type: Secondary | ICD-10-CM | POA: Diagnosis not present

## 2019-12-18 DIAGNOSIS — F338 Other recurrent depressive disorders: Secondary | ICD-10-CM | POA: Diagnosis not present

## 2019-12-18 DIAGNOSIS — Z79899 Other long term (current) drug therapy: Secondary | ICD-10-CM | POA: Diagnosis not present

## 2019-12-18 DIAGNOSIS — F419 Anxiety disorder, unspecified: Secondary | ICD-10-CM | POA: Diagnosis not present

## 2019-12-22 DIAGNOSIS — M7061 Trochanteric bursitis, right hip: Secondary | ICD-10-CM | POA: Diagnosis not present

## 2019-12-22 DIAGNOSIS — M25551 Pain in right hip: Secondary | ICD-10-CM | POA: Diagnosis not present

## 2019-12-22 DIAGNOSIS — M7062 Trochanteric bursitis, left hip: Secondary | ICD-10-CM | POA: Diagnosis not present

## 2019-12-22 DIAGNOSIS — M25561 Pain in right knee: Secondary | ICD-10-CM | POA: Diagnosis not present

## 2019-12-24 DIAGNOSIS — U071 COVID-19: Secondary | ICD-10-CM | POA: Diagnosis not present

## 2019-12-24 DIAGNOSIS — R05 Cough: Secondary | ICD-10-CM | POA: Diagnosis not present

## 2019-12-24 DIAGNOSIS — R21 Rash and other nonspecific skin eruption: Secondary | ICD-10-CM | POA: Diagnosis not present

## 2020-01-12 DIAGNOSIS — M7062 Trochanteric bursitis, left hip: Secondary | ICD-10-CM | POA: Insufficient documentation

## 2020-01-12 DIAGNOSIS — M7061 Trochanteric bursitis, right hip: Secondary | ICD-10-CM | POA: Diagnosis not present

## 2020-01-29 DIAGNOSIS — L409 Psoriasis, unspecified: Secondary | ICD-10-CM | POA: Diagnosis not present

## 2020-01-29 DIAGNOSIS — L4050 Arthropathic psoriasis, unspecified: Secondary | ICD-10-CM | POA: Diagnosis not present

## 2020-01-29 DIAGNOSIS — R768 Other specified abnormal immunological findings in serum: Secondary | ICD-10-CM | POA: Diagnosis not present

## 2020-01-30 ENCOUNTER — Other Ambulatory Visit: Payer: Self-pay | Admitting: Family Medicine

## 2020-01-30 DIAGNOSIS — Z1231 Encounter for screening mammogram for malignant neoplasm of breast: Secondary | ICD-10-CM

## 2020-02-06 ENCOUNTER — Ambulatory Visit
Admission: RE | Admit: 2020-02-06 | Discharge: 2020-02-06 | Disposition: A | Discharge status: Home / Self Care | Payer: BC Managed Care – PPO | Source: Ambulatory Visit | Attending: Family Medicine | Admitting: Family Medicine

## 2020-02-06 ENCOUNTER — Other Ambulatory Visit: Payer: Self-pay

## 2020-02-06 DIAGNOSIS — Z1231 Encounter for screening mammogram for malignant neoplasm of breast: Secondary | ICD-10-CM | POA: Diagnosis not present

## 2020-02-07 ENCOUNTER — Ambulatory Visit: Payer: Self-pay

## 2020-02-08 ENCOUNTER — Other Ambulatory Visit: Payer: Self-pay | Admitting: Family Medicine

## 2020-02-08 DIAGNOSIS — N6489 Other specified disorders of breast: Secondary | ICD-10-CM

## 2020-02-13 ENCOUNTER — Telehealth: Payer: Self-pay | Admitting: Obstetrics & Gynecology

## 2020-02-13 NOTE — Telephone Encounter (Signed)
Patient called with concerns of low iron, fatigue and irregular cycles.

## 2020-02-13 NOTE — Telephone Encounter (Signed)
Spoke to pt. Pt states having possible low iron because having fatigue and still having some irregular cycles with having 2 cycles while having an IUD in Feb 2021. Pt states has been trying to donate blood and was turned away due to low hgb, which is below [facility] hgb of 12.5. Pt now wanting to discuss plan for labs and possible hysterectomy in the near future. Pt scheduled for OV on 02/15/2020 at 3 pm. Pt agreeable and verbalized understanding.   Routing to Dr Sabra Heck for review and will close encounter.

## 2020-02-15 ENCOUNTER — Ambulatory Visit (INDEPENDENT_AMBULATORY_CARE_PROVIDER_SITE_OTHER): Payer: BC Managed Care – PPO | Admitting: Obstetrics & Gynecology

## 2020-02-15 ENCOUNTER — Other Ambulatory Visit: Payer: Self-pay

## 2020-02-15 ENCOUNTER — Encounter: Payer: Self-pay | Admitting: Obstetrics & Gynecology

## 2020-02-15 VITALS — BP 112/68 | HR 74 | Temp 98.1°F | Ht 61.25 in | Wt 186.4 lb

## 2020-02-15 DIAGNOSIS — N800 Endometriosis of uterus: Secondary | ICD-10-CM

## 2020-02-15 DIAGNOSIS — N8003 Adenomyosis of the uterus: Secondary | ICD-10-CM

## 2020-02-15 DIAGNOSIS — N816 Rectocele: Secondary | ICD-10-CM

## 2020-02-15 DIAGNOSIS — N812 Incomplete uterovaginal prolapse: Secondary | ICD-10-CM

## 2020-02-15 DIAGNOSIS — R5383 Other fatigue: Secondary | ICD-10-CM | POA: Diagnosis not present

## 2020-02-15 DIAGNOSIS — N926 Irregular menstruation, unspecified: Secondary | ICD-10-CM | POA: Diagnosis not present

## 2020-02-15 NOTE — Progress Notes (Signed)
GYNECOLOGY  VISIT  CC:   Patient states that she had a period 01/08/20, then 2/19, and is currently spotting. Patient states that she was unable to donate blood due to hgb being mildly low.  HPI: 44 y.o. G23P2002 Married White or Caucasian female here for irregular cycles with IUD.  She reports having two episodes of bleeding in February.  She has hx of adenomyosis and has been treated with Mirena IUD.  Reports she's tried to give blood twice due to anemia.  This has never happened to her in the past.    She recently joined [employer] and has lost two pounds.  She and her family had Covid in January.  She is having a lot of fatigue but isn't sure if this is related to bleeding or to Covid.  She is really getting sick of he bleeding and has decided to proceed with hysterectomy at this point.  She did see Dr. Quincy Simmonds in consultation who recommended urodynamics.  She postponed this as she was not sure what she wanted to do from surgical standpoint.  She is ready to get process started again.  GYNECOLOGIC HISTORY: Patient's last menstrual period was 01/26/2020. Contraception: Mirena IUD Menopausal hormone therapy: none  Patient Active Problem List   Diagnosis Date Noted  . Patellofemoral arthritis of right knee 05/11/2018  . Lumbar radiculopathy 03/22/2018  . Nonallopathic lesion of thoracic region 10/07/2017  . Nonallopathic lesion of cervical region 10/07/2017  . Nonallopathic lesion of lumbosacral region 10/07/2017  . Piriformis syndrome of right side 08/24/2017  . Polyarthropathy or polyarthritis of multiple sites 08/24/2017  . Closed right tarsal navicular fracture 08/02/2017  . 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 Mild episode of recurrent major depressive disorder ([place]) 02/16/2017  . Attention deficit hyperactivity disorder (ADHD) 02/16/2017  . Flatulence 02/16/2017  . BMI 33.0-33.9,adult 02/16/2017  . Psoriasis 02/16/2017  . Heterozygous factor V Leiden  mutation ([place]) 09/01/2016  . Headache, menstrual migraine 06/07/2012  . Malignant melanoma ([place]) 06/07/2012  . Dysmenorrhea 06/07/2012    Past Medical History:  Diagnosis Date  . Anxiety   . Attention deficit hyperactivity disorder (ADHD) 02/16/2017   Sees [facility] for managment  . Calculus of kidney 06/07/2012   Overview:  Dr Karsten Ro - urology   . Depression   . Factor V Leiden carrier ([place])    heterozygous  . Hernia, rectovaginal 06/07/2012   Overview:  Dr Corinna Capra - gyn   . History of abnormal cervical Pap smear 1997   dysplasia- LEEP  . Migraine   . Ovarian cyst 1994  . Psoriasis 02/16/2017   -sees dermatologist  . Rectocele     Past Surgical History:  Procedure Laterality Date  . CERVICAL BIOPSY  W/ LOOP ELECTRODE EXCISION    . CESAREAN SECTION  2007 & 2010  . OVARIAN CYST SURGERY  1994  . TUBAL LIGATION  2010  . VULVA /PERINEUM BIOPSY  01/2015    MEDS:   Current Outpatient Medications on File Prior to Visit  Medication Sig Dispense Refill  . Probiotic Product (PROBIOTIC DAILY) CAPS Take by mouth daily.    . rizatriptan (MAXALT) 10 MG tablet Take 10 mg by mouth as needed for migraine. May repeat in 2 hours if needed    . Secukinumab (COSENTYX SENSOREADY PEN) 150 MG/ML SOAJ Cosentyx Pen 300 mg/2 Pens (150 mg/mL) subcutaneous    . Ubrogepant (UBRELVY) 100 MG TABS Ubrelvy 100 mg tablet    . venlafaxine XR (  EFFEXOR-XR) 37.5 MG 24 hr capsule TAKE 1 CAPSULE DAILY WITH BREAKFAST. 90 capsule 1   No current facility-administered medications on file prior to visit.    ALLERGIES: Patient has no known allergies.  Family History  Problem Relation Age of Onset  . Esophageal cancer Father   . Esophageal cancer Maternal Uncle   . Rectal cancer Maternal Uncle   . Diabetes Maternal Grandmother   . Heart disease Maternal Grandmother     SH:  Married, non smoker  Review of Systems  Genitourinary:       Irregular menses  All other systems reviewed and are  negative.   PHYSICAL EXAMINATION:    BP 112/68 (BP Location: Left Arm, Patient Position: Sitting, Cuff Size: Normal)   Pulse 74   Temp 98.1 F (36.7 C) (Temporal)   Ht 5' 1.25" (1.556 m)   Wt 186 lb 6.4 oz (84.6 kg)   LMP 01/26/2020   SpO2 99%   BMI 34.93 kg/m     General appearance: alert, cooperative and appears stated age  Assessment: H/o menorrhagia with irregular cycles Mirena IUD in place Incomplete uterine prolapse with rectocele Adenomyosis Fatigue  H/o Covid infection  Plan: She is going to have CBC and iron studies done today.  Depending on results, she may decide to proceed with surgery.  If so, will need urodynamic testing as well as repeat PUS and endometrial biopsy.   ~20 minutes spent in discussion with pt about current symptoms, blood work, possible surgery and pre-op evaluation

## 2020-02-16 ENCOUNTER — Encounter: Payer: Self-pay | Admitting: Obstetrics & Gynecology

## 2020-02-16 LAB — IRON,TIBC AND FERRITIN PANEL
Ferritin: 9 ng/mL — ABNORMAL LOW (ref 15–150)
Iron Saturation: 13 % — ABNORMAL LOW (ref 15–55)
Iron: 40 ug/dL (ref 27–159)
Total Iron Binding Capacity: 317 ug/dL (ref 250–450)
UIBC: 277 ug/dL (ref 131–425)

## 2020-02-16 LAB — CBC
Hematocrit: 36.7 % (ref 34.0–46.6)
Hemoglobin: 11.7 g/dL (ref 11.1–15.9)
MCH: 27.1 pg (ref 26.6–33.0)
MCHC: 31.9 g/dL (ref 31.5–35.7)
MCV: 85 fL (ref 79–97)
Platelets: 233 10*3/uL (ref 150–450)
RBC: 4.32 x10E6/uL (ref 3.77–5.28)
RDW: 15.8 % — ABNORMAL HIGH (ref 11.7–15.4)
WBC: 7.7 10*3/uL (ref 3.4–10.8)

## 2020-02-19 ENCOUNTER — Telehealth: Payer: Self-pay | Admitting: Obstetrics & Gynecology

## 2020-02-19 DIAGNOSIS — N926 Irregular menstruation, unspecified: Secondary | ICD-10-CM

## 2020-02-19 NOTE — Telephone Encounter (Signed)
Patient is calling to schedule appointments she will need prior to surgery.

## 2020-02-19 NOTE — Telephone Encounter (Signed)
Spoke with patient. Patient is ready to proceed with surgery discussed at [place] on 02/15/20 with [name].   LMP 02/15/20, Mirena IUD for contraceptive.  PUS and EMB scheduled for 02/29/20 at 2:30pm, consult to follow at 3pm.   Advised patient I will update [name]. Verline Lema will return call to discuss surgery scheduling and urodynamics testing. Business office will call to review benefits. Patient verbalizes understanding and is agreeable.   Routing to [name]   Cc: Verline Lema Sprague RN, Angie Fava Carder

## 2020-02-21 ENCOUNTER — Other Ambulatory Visit: Payer: Self-pay | Admitting: Family Medicine

## 2020-02-21 ENCOUNTER — Ambulatory Visit
Admission: RE | Admit: 2020-02-21 | Discharge: 2020-02-21 | Disposition: A | Discharge status: Home / Self Care | Payer: BC Managed Care – PPO | Source: Ambulatory Visit | Attending: Family Medicine | Admitting: Family Medicine

## 2020-02-21 ENCOUNTER — Telehealth: Payer: Self-pay | Admitting: Obstetrics & Gynecology

## 2020-02-21 ENCOUNTER — Other Ambulatory Visit: Payer: Self-pay

## 2020-02-21 DIAGNOSIS — N6489 Other specified disorders of breast: Secondary | ICD-10-CM

## 2020-02-21 DIAGNOSIS — R928 Other abnormal and inconclusive findings on diagnostic imaging of breast: Secondary | ICD-10-CM | POA: Diagnosis not present

## 2020-02-21 NOTE — Telephone Encounter (Signed)
Call placed to convey benefits for ultrasound and endometrial biopsy. Spoke with the patient and conveyed the benefits. Patient understands/agreeable with the benefits. Patient is aware of the cancellation policy. Appointment scheduled 02/29/20.

## 2020-02-22 ENCOUNTER — Other Ambulatory Visit: Payer: BC Managed Care – PPO

## 2020-02-23 ENCOUNTER — Telehealth: Payer: Self-pay | Admitting: Obstetrics & Gynecology

## 2020-02-23 NOTE — Telephone Encounter (Signed)
Spoke with patient. Patient would like to proceed with surgery on 04/08/2020. Urodynamics scheduled for 03/20/2020 at 9 am. Urinalysis before urodynamics scheduled for 03/14/2020 at 9:15 am. Patient is scheduled for ultrasound and EMB on 02/29/2020 at 2:30 pm with 3 pm consult with Dr.Miller.

## 2020-02-23 NOTE — Telephone Encounter (Signed)
Spoke with patient regarding surgery benefits. Patient acknowledges understanding of information presented. Patient is aware that benefits presented are for professional benefits only. Patient is aware that once surgery is scheduled, the hospital will call with separate benefits. Patient is aware of surgery cancellation policy. ° °Patient is ready to proceed with scheduling. °

## 2020-02-23 NOTE — Telephone Encounter (Signed)
Call to patient. Per DPR, OK to leave message on voicemail.   Left voicemail requesting a return call to [facility] to review benefits for recommended surgery with M. Edwinna Areola, MD   Cc: Verline Lema

## 2020-02-26 NOTE — Telephone Encounter (Signed)
Pre-op appts are required within 30 days of planned surgery for all surgeons so I think she will need pre-op appts for both of [place].  It is ok to move her endometrial biopsy and PUS into April so I can do it all at the same time.  Thanks.

## 2020-02-26 NOTE — Telephone Encounter (Signed)
Patient scheduled for surgery on 04/08/2020 at [facility]. PUS and EMB with Dr.Miller scheduled for 2:30 pm on 02/29/2020. Urinalysis before urodynamics scheduled for 03/14/2020 at 9:15 am. Urodynamics scheduled for 03/20/2020 at 9 am.  Urodynamics consult scheduled for 03/27/2020 at 11:30 am with Dr.Silva. 1 week post op scheduled for 04/15/2020 at 2 pm with Dr.Miller. 6 week post op scheduled for 05/23/2020 at 8 am with Dr.Miller. Patient is agreeable to all dates and times. Surgery instructions reviewed and will be given at the patients appointment on 02/29/2020.  Dr.Miller will patient also need a preop in addition to PUS and EMB appointment? Will she needs any preop or postops with Dr.Silva?

## 2020-02-27 NOTE — Telephone Encounter (Signed)
Spoke with patient. PUS/EMB/PRE OP moved to 03/21/2020 at 3 pm. Pre op with Dr.Silva to be done on 03/27/2020 when she comes to discuss urodynamics results. 1 week post op moved to 04/15/2020 at 3 pm with Dr.Silva. 6 week post op remains the same on 05/23/2020 at 8 am with Dr.Miller. Patient is agreeable to all dates and times.  Cc: Dr.Silva  Routing to provider and will close encounter.

## 2020-02-28 ENCOUNTER — Other Ambulatory Visit: Payer: Self-pay | Admitting: Obstetrics & Gynecology

## 2020-02-29 ENCOUNTER — Other Ambulatory Visit: Payer: BC Managed Care – PPO

## 2020-02-29 ENCOUNTER — Other Ambulatory Visit: Payer: BC Managed Care – PPO | Admitting: Obstetrics & Gynecology

## 2020-03-12 DIAGNOSIS — Z85820 Personal history of malignant melanoma of skin: Secondary | ICD-10-CM | POA: Diagnosis not present

## 2020-03-12 DIAGNOSIS — D225 Melanocytic nevi of trunk: Secondary | ICD-10-CM | POA: Diagnosis not present

## 2020-03-12 DIAGNOSIS — D485 Neoplasm of uncertain behavior of skin: Secondary | ICD-10-CM | POA: Diagnosis not present

## 2020-03-12 DIAGNOSIS — L700 Acne vulgaris: Secondary | ICD-10-CM | POA: Diagnosis not present

## 2020-03-12 DIAGNOSIS — D2261 Melanocytic nevi of right upper limb, including shoulder: Secondary | ICD-10-CM | POA: Diagnosis not present

## 2020-03-14 ENCOUNTER — Ambulatory Visit (INDEPENDENT_AMBULATORY_CARE_PROVIDER_SITE_OTHER): Payer: BC Managed Care – PPO | Admitting: *Deleted

## 2020-03-14 ENCOUNTER — Other Ambulatory Visit: Payer: Self-pay

## 2020-03-14 VITALS — BP 104/62 | HR 72 | Temp 97.5°F | Resp 14 | Ht 62.0 in | Wt 187.5 lb

## 2020-03-14 DIAGNOSIS — Z01812 Encounter for preprocedural laboratory examination: Secondary | ICD-10-CM | POA: Diagnosis not present

## 2020-03-14 LAB — POCT URINALYSIS DIPSTICK
Bilirubin, UA: NEGATIVE
Blood, UA: NEGATIVE
Glucose, UA: NEGATIVE
Ketones, UA: NEGATIVE
Leukocytes, UA: NEGATIVE
Nitrite, UA: NEGATIVE
Protein, UA: NEGATIVE
Urobilinogen, UA: 0.2 E.U./dL
pH, UA: 7 (ref 5.0–8.0)

## 2020-03-14 NOTE — Progress Notes (Signed)
Patient here for urinalysis prior to urodynamics scheduled for 03-20-20. Clean catch urine provided and urinalysis was normal. Patient updated. All questions about urodynamics answered prior to patient leaving office.   Routing to provider and will close encounter.   Cc Dr. Quincy Simmonds

## 2020-03-18 ENCOUNTER — Other Ambulatory Visit: Payer: Self-pay

## 2020-03-18 DIAGNOSIS — N3946 Mixed incontinence: Secondary | ICD-10-CM

## 2020-03-18 DIAGNOSIS — N812 Incomplete uterovaginal prolapse: Secondary | ICD-10-CM

## 2020-03-18 DIAGNOSIS — N816 Rectocele: Secondary | ICD-10-CM

## 2020-03-18 NOTE — Progress Notes (Signed)
Orders placed for urodynamics.

## 2020-03-19 ENCOUNTER — Telehealth: Payer: Self-pay

## 2020-03-19 NOTE — Telephone Encounter (Signed)
Patient returning call to Jill. °

## 2020-03-19 NOTE — Telephone Encounter (Signed)
Spoke with patient. Urodynamics consult rescheduled to 04/02/20 at 11:30am with Dr. Quincy Simmonds. Patient is aware to proceed with Urodynamics as scheduled for 4/14 and PUS & EMB with [name] on 4/15. Patient is agreeable to date and time.   Encounter closed.

## 2020-03-20 ENCOUNTER — Ambulatory Visit (INDEPENDENT_AMBULATORY_CARE_PROVIDER_SITE_OTHER): Payer: BC Managed Care – PPO | Admitting: *Deleted

## 2020-03-20 ENCOUNTER — Other Ambulatory Visit: Payer: Self-pay

## 2020-03-20 DIAGNOSIS — N812 Incomplete uterovaginal prolapse: Secondary | ICD-10-CM | POA: Diagnosis not present

## 2020-03-20 DIAGNOSIS — N3946 Mixed incontinence: Secondary | ICD-10-CM

## 2020-03-20 DIAGNOSIS — N816 Rectocele: Secondary | ICD-10-CM

## 2020-03-20 NOTE — Progress Notes (Signed)
Autumn [name] is a 44 y.o. female Who presents today for urodynamics testing, ordered by Dr. Quincy Simmonds.   Allergies and medications reviewed.  Denies complaints today. No urinary complaints.   Urine Micro exam: negative for WBC's or RBC's, okay to proceed per Dr. Scharlene Corn.  Patient reports urinary leakage with laughing or coughing hard.   Urodynamics testing initiated. Lumax Bladder Catheter #10 [place] and lumax Abdominal Catheter #10 [place].   Post void residual 70 ml.   Urethral catheter placed without issue. Rectal catheter placed without issue.   Urodynamics testing completed. Please see scanned Patient summary report in Epic. Procedure completed and patient tolerated well without complaints. Patient scheduled for follow up office visit with Dr. Quincy Simmonds to discuss results. Patient agreeable.   Patient given post procedure instructions:  You may have a mild bladder and rectal discomfort for a few hours after the test. You may experience some frequent urination and slight burning the first few times you urinate after the test. Rarely, the urine may be blood tinged. These are both due to catheter placements and resolve quickly. You should call our office immediately if you have signs of infection, which may include bladder pain, urinary urgency, fever, or burning during urination. We do encourage you to drink plenty of water after the test.

## 2020-03-21 ENCOUNTER — Encounter: Payer: Self-pay | Admitting: Obstetrics & Gynecology

## 2020-03-21 ENCOUNTER — Ambulatory Visit (INDEPENDENT_AMBULATORY_CARE_PROVIDER_SITE_OTHER): Payer: BC Managed Care – PPO | Admitting: Obstetrics & Gynecology

## 2020-03-21 ENCOUNTER — Ambulatory Visit (INDEPENDENT_AMBULATORY_CARE_PROVIDER_SITE_OTHER): Payer: BC Managed Care – PPO

## 2020-03-21 ENCOUNTER — Other Ambulatory Visit: Payer: BC Managed Care – PPO | Admitting: Obstetrics & Gynecology

## 2020-03-21 ENCOUNTER — Other Ambulatory Visit: Payer: Self-pay

## 2020-03-21 ENCOUNTER — Other Ambulatory Visit (HOSPITAL_COMMUNITY)
Admission: RE | Admit: 2020-03-21 | Discharge: 2020-03-21 | Disposition: A | Discharge status: Home / Self Care | Payer: BC Managed Care – PPO | Source: Ambulatory Visit | Attending: Obstetrics & Gynecology | Admitting: Obstetrics & Gynecology

## 2020-03-21 VITALS — BP 118/68 | HR 68 | Temp 97.3°F | Resp 16 | Wt 187.0 lb

## 2020-03-21 DIAGNOSIS — T8332XA Displacement of intrauterine contraceptive device, initial encounter: Secondary | ICD-10-CM

## 2020-03-21 DIAGNOSIS — N921 Excessive and frequent menstruation with irregular cycle: Secondary | ICD-10-CM

## 2020-03-21 DIAGNOSIS — N812 Incomplete uterovaginal prolapse: Secondary | ICD-10-CM

## 2020-03-21 DIAGNOSIS — N926 Irregular menstruation, unspecified: Secondary | ICD-10-CM | POA: Insufficient documentation

## 2020-03-21 DIAGNOSIS — N8003 Adenomyosis of the uterus: Secondary | ICD-10-CM

## 2020-03-21 DIAGNOSIS — N800 Endometriosis of uterus: Secondary | ICD-10-CM

## 2020-03-21 NOTE — Progress Notes (Signed)
44 y.o. G16P2002 Married White or Caucasian female here for discussion of upcoming procedure.  TLH with bilateral salpingectomy with cystoscopy and rectocele repair with possible TVT (with Dr. Quincy Simmonds) planned due to menorrhagia, rectocele and SUI.  She did urodynamics testing earlier this week but has not yet see Dr. Quincy Simmonds.  She had pre-operative ultrasound and is having an endometrial biopsy today.  Ultrasound showed: Uterus:  8 x 5.5 x 4.0cm with 7.14mm endometrium.  IUD is located in cervix today.  Left ovary is 3 x 2.2 x 1.4cm and right ovary is 4.5 x 2.3 x 4.4cm with simple, thin walled ovarian cyst measuring 3.6 x 4.4 x 3cm cyst.    Pt does not want to be treated with any more hormonal therapy and is desirous of definitive therapy with hsyterectomy.  We discussed this last year and were very close to scheduling but Covid occurred and pt decided to wait.     Procedure discussed with patient.  Hospital stay, recovery and pain management all discussed.  Risks discussed including but not limited to bleeding, 1% risk of receiving a  transfusion, infection, 3-4% risk of bowel/bladder/ureteral/vascular injury discussed as well as possible need for additional surgery if injury does occur discussed.  DVT/PE and rare risk of death discussed.  My actual complications with prior surgeries discussed.  Vaginal cuff dehiscence discussed.  Hernia formation discussed.  Positioning and incision locations discussed.  Patient aware if pathology abnormal she may need additional treatment.  All questions answered.    Ob Hx:   Patient's last menstrual period was 03/20/2020 (exact date).          Sexually active: Yes.   Birth control: bilateral tubal ligation Last pap: 06/06/2019 neg with neg HR HPV Last MMG: 02/21/2020, pt had diagnostic imaging that was felt showed stimulated fibrocystic breast changes.  6 month follow up recommended Tobacco: non smoker  Past Surgical History:  Procedure Laterality Date  . CERVICAL  BIOPSY  W/ LOOP ELECTRODE EXCISION    . CESAREAN SECTION  2007 & 2010  . OVARIAN CYST SURGERY  1994  . TUBAL LIGATION  2010  . VULVA /PERINEUM BIOPSY  01/2015    Past Medical History:  Diagnosis Date  . Anxiety   . Attention deficit hyperactivity disorder (ADHD) 02/16/2017   Sees [facility] for managment  . Calculus of kidney 06/07/2012   Overview:  Dr Karsten Ro - urology   . Depression   . Factor V Leiden carrier ([place])    heterozygous  . Hernia, rectovaginal 06/07/2012   Overview:  Dr Corinna Capra - gyn   . History of abnormal cervical Pap smear 1997   dysplasia- LEEP  . Migraine   . Ovarian cyst 1994  . Psoriasis 02/16/2017   -sees dermatologist  . Rectocele     Allergies: Patient has no known allergies.  Current Outpatient Medications  Medication Sig Dispense Refill  . ferrous sulfate 325 (65 FE) MG tablet Take 325 mg by mouth daily with breakfast.    . meloxicam (MOBIC) 15 MG tablet Take 15 mg by mouth daily as needed for pain.    . Multiple Vitamin (MULTIVITAMIN WITH MINERALS) TABS tablet Take 1 tablet by mouth daily.    Marland Kitchen olopatadine (PATADAY) 0.1 % ophthalmic solution Place 1 drop into both eyes daily.    Marland Kitchen tretinoin (RETIN-A) 0.05 % cream Apply 1 application topically daily as needed (acne).     . Ubrogepant (UBRELVY) 100 MG TABS Take 100 mg by mouth daily as  needed (migraine).     . venlafaxine XR (EFFEXOR-XR) 37.5 MG 24 hr capsule TAKE 1 CAPSULE DAILY WITH BREAKFAST. (Patient taking differently: Take 37.5 mg by mouth daily with breakfast. ) 90 capsule 1  . Secukinumab (COSENTYX SENSOREADY PEN) 150 MG/ML SOAJ Inject 300 mg into the skin every 30 (thirty) days.      No current facility-administered medications for this visit.    ROS: Pertinent items noted in HPI and remainder of comprehensive ROS otherwise negative.  Exam:    BP 118/68   Pulse 68   Temp (!) 97.3 F (36.3 C) (Skin)   Resp 16   Wt 187 lb (84.8 kg)   LMP 03/20/2020 (Exact Date)   BMI  34.76 kg/m   General appearance: alert and cooperative Head: Normocephalic, without obvious abnormality, atraumatic Neck: no adenopathy, supple, symmetrical, trachea midline and thyroid not enlarged, symmetric, no tenderness/mass/nodules Lungs: clear to auscultation bilaterally Heart: regular rate and rhythm, S1, S2 normal, no murmur, click, rub or gallop Abdomen: soft, non-tender; bowel sounds normal; no masses,  no organomegaly Extremities: extremities normal, atraumatic, no cyanosis or edema Skin: Skin color, texture, turgor normal. No rashes or lesions Lymph nodes: Cervical, supraclavicular, and axillary nodes normal. no inguinal nodes palpated Neurologic: Grossly normal  Pelvic: External genitalia:  no lesions              Urethra: normal appearing urethra with no masses, tenderness or lesions              Bartholins and Skenes: normal                 Vagina: normal appearing vagina with normal color and discharge, no lesions              Cervix: normal appearance , IUD string noted and is longer than expected.  Pt desires IUD removal today but knows could be left until surgery              Pap taken: No.        Bimanual Exam:  Uterus:  uterus is normal size, shape, consistency and nontender                                      Adnexa:    normal adnexa in size, nontender and no masses                                      Rectovaginal: Deferred  Endometrial biopsy recommended.  Discussed with patient.  Verbal and written consent obtained.   Procedure:  Speculum placed.  Cervix visualized and IUD string grasped and removed with one pull.  Then cervix cleansed with betadine prep.  A single toothed tenaculum was applied to the anterior lip of the cervix.  Endometrial pipelle was advanced through the cervix into the endometrial cavity without difficulty.  Pipelle passed to 7.5cm.  Suction applied and pipelle removed with good tissue sample obtained.  Tenculum removed.  No bleeding noted.   Patient tolerated procedure well.  Chaperone, Terence Lux, CMA, present for examination.   A: Menorrhagia with adenomyosis as most likely cause H/o cesarean section x 2 Rectocele and incomplete uterine prolapse SUI with recent urodynamics   Malpositioned IUD and pt desired removal today  P:  TLH with bilateral salpingectomy, cystoscopy planned Medications/Vitamins reviewed.  Pre and post op instructions reviewed

## 2020-03-24 ENCOUNTER — Encounter: Payer: Self-pay | Admitting: Obstetrics & Gynecology

## 2020-03-25 DIAGNOSIS — L988 Other specified disorders of the skin and subcutaneous tissue: Secondary | ICD-10-CM | POA: Diagnosis not present

## 2020-03-25 DIAGNOSIS — D485 Neoplasm of uncertain behavior of skin: Secondary | ICD-10-CM | POA: Diagnosis not present

## 2020-03-25 LAB — SURGICAL PATHOLOGY

## 2020-03-26 NOTE — Patient Instructions (Addendum)
DUE TO COVID-19 ONLY ONE VISITOR IS ALLOWED IN WAITING ROOM (VISITOR WILL HAVE A TEMPERATURE CHECK ON ARRIVAL AND MUST WEAR A FACE MASK THE ENTIRE TIME.)  ONCE YOU ARE ADMITTED TO YOUR PRIVATE ROOM, THE SAME ONE VISITOR IS ALLOWED TO VISIT DURING VISITING HOURS ONLY.  Your COVID swab testing is scheduled for:04/04/20  At: 3:00 pm  , You must self quarantine after your testing per handout given to you at the testing site.  ([place] up testing enter pre-surgical testing line)    Your procedure is scheduled on: 04/08/20  Report to [place] [facility] AT: 5:30  A. M.   Call this number if you have problems the morning of [phone].   OUR ADDRESS IS [address].  WE ARE LOCATED IN THE [place]                                   [facility].                                     REMEMBER:   DO NOT EAT FOOD OR DRINK LIQUIDS AFTER MIDNIGHT .    BRUSH YOUR TEETH THE MORNING OF SURGERY.  TAKE THESE MEDICATIONS MORNING OF SURGERY WITH A SIP OF WATER:  Ubrogepant as needed.Use eye drops as usual.  DO NOT WEAR JEWERLY, MAKE UP, OR NAIL POLISH.  DO NOT WEAR LOTIONS, POWDERS, PERFUMES/COLOGNE OR DEODORANT.  DO NOT SHAVE FOR 24 HOURS PRIOR TO DAY OF SURGERY.    CONTACTS, GLASSES, OR DENTURES MAY NOT BE WORN TO SURGERY.                                    [facility] IS NOT RESPONSIBLE  FOR ANY BELONGINGS.          BRING ALL PRESCRIPTION MEDICATIONS WITH YOU THE DAY OF SURGERY IN ORIGINAL CONTAINERS                               YOU MAY BRING A SMALL OVERNIGHT BAG                                .          [facility] - Preparing for Surgery Before surgery, you can play an important role.  Because skin is not sterile, your skin needs to be as free of germs as possible.  You can reduce the number of germs on your skin by washing with CHG (chlorahexidine gluconate) soap before surgery.  CHG is an antiseptic cleaner  which kills germs and bonds with the skin to continue killing germs even after washing. Please DO NOT use if you have an allergy to CHG or antibacterial soaps.  If your skin becomes reddened/irritated stop using the CHG and inform your nurse when you arrive at Short Stay. Do not shave (including legs and underarms) for at least 48 hours prior to the first CHG shower.  You may shave your face/neck. Please follow these instructions carefully:  1.  Shower with CHG Soap the night before surgery and the  morning  of Surgery.  2.  If you choose to wash your hair, wash your hair first as usual with your  normal  shampoo.  3.  After you shampoo, rinse your hair and body thoroughly to remove the  shampoo.                           4.  Use CHG as you would any other liquid soap.  You can apply chg directly  to the skin and wash                       Gently with a scrungie or clean washcloth.  5.  Apply the CHG Soap to your body ONLY FROM THE NECK DOWN.   Do not use on face/ open                           Wound or open sores. Avoid contact with eyes, ears mouth and genitals (private parts).                       Wash face,  Genitals (private parts) with your normal soap.             6.  Wash thoroughly, paying special attention to the area where your surgery  will be performed.  7.  Thoroughly rinse your body with warm water from the neck down.  8.  DO NOT shower/wash with your normal soap after using and rinsing off  the CHG Soap.                9.  Pat yourself dry with a clean towel.            10.  Wear clean pajamas.            11.  Place clean sheets on your bed the night of your first shower and do not  sleep with pets. Day of Surgery : Do not apply any lotions/deodorants the morning of surgery.  Please wear clean clothes to the hospital/surgery center.  FAILURE TO FOLLOW THESE INSTRUCTIONS MAY RESULT IN THE CANCELLATION OF YOUR SURGERY PATIENT SIGNATURE_________________________________  NURSE  SIGNATURE__________________________________  ________________________________________________________________________

## 2020-03-27 ENCOUNTER — Encounter (HOSPITAL_COMMUNITY): Payer: Self-pay

## 2020-03-27 ENCOUNTER — Other Ambulatory Visit: Payer: Self-pay

## 2020-03-27 ENCOUNTER — Ambulatory Visit: Payer: BC Managed Care – PPO | Admitting: Obstetrics and Gynecology

## 2020-03-27 ENCOUNTER — Encounter (HOSPITAL_COMMUNITY)
Admission: RE | Admit: 2020-03-27 | Discharge: 2020-03-27 | Disposition: A | Discharge status: Home / Self Care | Payer: BC Managed Care – PPO | Source: Ambulatory Visit | Attending: Obstetrics & Gynecology | Admitting: Obstetrics & Gynecology

## 2020-03-27 DIAGNOSIS — Z01812 Encounter for preprocedural laboratory examination: Secondary | ICD-10-CM | POA: Diagnosis not present

## 2020-03-27 HISTORY — DX: Anemia, unspecified: D64.9

## 2020-03-27 HISTORY — DX: Malignant (primary) neoplasm, unspecified: C80.1

## 2020-03-27 HISTORY — DX: Other complications of anesthesia, initial encounter: T88.59XA

## 2020-03-27 NOTE — Progress Notes (Signed)
PCP -  Dr. Deirdre Evener. LOV: 03/21/20 Cardiologist -   Chest x-ray -  EKG -  Stress Test -  ECHO -  Cardiac Cath -   Sleep Study -  CPAP -   Fasting Blood Sugar -  Checks Blood Sugar _____ times a day  Blood Thinner Instructions: Aspirin Instructions: Last Dose:  Anesthesia review:   Patient denies shortness of breath, fever, cough and chest pain at PAT appointment   Patient verbalized understanding of instructions that were given to them at the PAT appointment. Patient was also instructed that they will need to review over the PAT instructions again at home before surgery.

## 2020-03-28 ENCOUNTER — Encounter (HOSPITAL_COMMUNITY)
Admission: RE | Admit: 2020-03-28 | Discharge: 2020-03-28 | Disposition: A | Discharge status: Home / Self Care | Payer: BC Managed Care – PPO | Source: Ambulatory Visit | Attending: Obstetrics & Gynecology | Admitting: Obstetrics & Gynecology

## 2020-03-28 DIAGNOSIS — Z01812 Encounter for preprocedural laboratory examination: Secondary | ICD-10-CM | POA: Diagnosis not present

## 2020-03-28 LAB — BASIC METABOLIC PANEL
Anion gap: 3 — ABNORMAL LOW (ref 5–15)
BUN: 14 mg/dL (ref 6–20)
CO2: 29 mmol/L (ref 22–32)
Calcium: 8.6 mg/dL — ABNORMAL LOW (ref 8.9–10.3)
Chloride: 107 mmol/L (ref 98–111)
Creatinine, Ser: 0.61 mg/dL (ref 0.44–1.00)
GFR calc Af Amer: >60 mL/min (ref >60)
GFR calc non Af Amer: >60 mL/min (ref >60)
Glucose, Bld: 100 mg/dL — ABNORMAL HIGH (ref 70–99)
Potassium: 4.5 mmol/L (ref 3.5–5.1)
Sodium: 139 mmol/L (ref 135–145)

## 2020-03-28 LAB — TYPE AND SCREEN
ABO/RH(D): O POS
Antibody Screen: NEGATIVE

## 2020-03-28 LAB — CBC
HCT: 40.5 % (ref 36.0–46.0)
Hemoglobin: 12.7 g/dL (ref 12.0–15.0)
MCH: 28 pg (ref 26.0–34.0)
MCHC: 31.4 g/dL (ref 30.0–36.0)
MCV: 89.4 fL (ref 80.0–100.0)
Platelets: 217 10*3/uL (ref 150–400)
RBC: 4.53 MIL/uL (ref 3.87–5.11)
RDW: 17.3 % — ABNORMAL HIGH (ref 11.5–15.5)
WBC: 7.3 10*3/uL (ref 4.0–10.5)
nRBC: 0 % (ref 0.0–0.2)

## 2020-03-28 LAB — ABO/RH: ABO/RH(D): O POS

## 2020-04-01 ENCOUNTER — Other Ambulatory Visit: Payer: Self-pay

## 2020-04-01 NOTE — Progress Notes (Addendum)
GYNECOLOGY  VISIT   HPI: 44 y.o.   Married  Caucasian  female   G2P2002 with Patient's last menstrual period was 03/20/2020 (exact date).   here to discuss results of urodynamics testing and surgical consult.    Patient is referred by [name], her primary GYN. Patient is having menorrhagia with irregular menses and is planning for hysterectomy for definitive treatment.  She has presumptive adenomyosis.  Status post tubal ligation.   Patient has a history of a rectocele since prior to childbearing.  She has done pelvic floor therapy in the past.   She has protrusion of the vagina when she has a BM.  She has BMs sometimes twice a day.  She does rare splinting.  Denies fecal soiling.   Leakage of urine with cough, laugh sometimes with a full bladder.  She has a hard time getting to the bathroom when she is ready to void.  She cannot stop her flow.  No leak with exercise, but is doing low impact exercise.  DF in am every 30 - 45 min.  DF in afternoon spaces out more.   Urodynamic testing confirms stress incontinence.   C/S x 2.  Had trial of labor and failure to descend with first child.  States she had low blood pressure with her second C/S.  She states she has a chronic cough due to allergies.   GYNECOLOGIC HISTORY: Patient's last menstrual period was 03/20/2020 (exact date). Contraception: Tubal Menopausal hormone therapy:  none Last mammogram:02-06-20 3D/Rt.Br.poss.asymmetry, Lt.Br.neg/density B;Diag.Rt.and Rt.U/S--Persistent right breast asymmetry with imaging features compatible with hormonally and caffeine stimulated fibroglandular tissue/6 mo.f/u Rt.Diag/US Last pap smear: 06-06-19 Neg:Neg HR HPV, 05-10-18 Neg, 01-14-16 Neg:Neg HR HPV        OB History    Gravida  2   Para  2   Term  2   Preterm  0   AB  0   Living  2     SAB  0   TAB  0   Ectopic  0   Multiple  0   Live Births  2              Patient Active Problem List   Diagnosis Date Noted   . Trochanteric bursitis of left hip 01/12/2020  . Patellofemoral arthritis of right knee 05/11/2018  . Lumbar radiculopathy 03/22/2018  . Nonallopathic lesion of thoracic region 10/07/2017  . Nonallopathic lesion of cervical region 10/07/2017  . Nonallopathic lesion of lumbosacral region 10/07/2017  . Piriformis syndrome of right side 08/24/2017  . Polyarthropathy or polyarthritis of multiple sites 08/24/2017  . Closed right tarsal navicular fracture 08/02/2017  . 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 Mild episode of recurrent major depressive disorder ([place]) 02/16/2017  . Attention deficit hyperactivity disorder (ADHD) 02/16/2017  . Flatulence 02/16/2017  . BMI 33.0-33.9,adult 02/16/2017  . Psoriasis 02/16/2017  . Heterozygous factor V Leiden mutation ([place]) 09/01/2016  . Headache, menstrual migraine 06/07/2012  . Malignant melanoma ([place]) 06/07/2012  . Dysmenorrhea 06/07/2012    Past Medical History:  Diagnosis Date  . Anemia   . Anxiety   . Attention deficit hyperactivity disorder (ADHD) 02/16/2017   Sees [facility] for managment  . Calculus of kidney 06/07/2012   Overview:  Dr Karsten Ro - urology   . Cancer ([place])    melanoma removed from abdomen in 2000  . Complication of anesthesia    low BP during C-section  . Depression   . Factor  V Leiden carrier [facility])    heterozygous  . Hernia, rectovaginal 06/07/2012   Overview:  Dr Corinna Capra - gyn   . History of abnormal cervical Pap smear 1997   dysplasia- LEEP  . Migraine   . Ovarian cyst 1994  . Psoriasis 02/16/2017   -sees dermatologist  . Rectocele     Past Surgical History:  Procedure Laterality Date  . CERVICAL BIOPSY  W/ LOOP ELECTRODE EXCISION    . CESAREAN SECTION  2007 & 2010  . KNEE ARTHROPLASTY Right 1989  . OVARIAN CYST SURGERY  1994  . SINUS ENDO WITH FUSION    . TUBAL LIGATION  2010  . VULVA /PERINEUM BIOPSY  01/2015    Current Outpatient Medications  Medication Sig  Dispense Refill  . meloxicam (MOBIC) 15 MG tablet Take 15 mg by mouth daily as needed for pain.    Marland Kitchen olopatadine (PATADAY) 0.1 % ophthalmic solution Place 1 drop into both eyes daily.    . Secukinumab (COSENTYX SENSOREADY PEN) 150 MG/ML SOAJ Inject 300 mg into the skin every 30 (thirty) days.     Marland Kitchen tretinoin (RETIN-A) 0.05 % cream Apply 1 application topically daily as needed (acne).     . Ubrogepant (UBRELVY) 100 MG TABS Take 100 mg by mouth daily as needed (migraine).     . venlafaxine XR (EFFEXOR-XR) 37.5 MG 24 hr capsule TAKE 1 CAPSULE DAILY WITH BREAKFAST. (Patient taking differently: Take 37.5 mg by mouth daily with breakfast. ) 90 capsule 1  . ferrous sulfate 325 (65 FE) MG tablet Take 325 mg by mouth daily with breakfast.    . Multiple Vitamin (MULTIVITAMIN WITH MINERALS) TABS tablet Take 1 tablet by mouth daily.     No current facility-administered medications for this visit.     ALLERGIES: Patient has no known allergies.  Family History  Problem Relation Age of Onset  . Esophageal cancer Father   . Esophageal cancer Maternal Uncle   . Rectal cancer Maternal Uncle   . Diabetes Maternal Grandmother   . Heart disease Maternal Grandmother     Social History   Socioeconomic History  . Marital status: Married    Spouse name: Not on file  . Number of children: Not on file  . Years of education: Not on file  . Highest education level: Not on file  Occupational History  . Not on file  Tobacco Use  . Smoking status: Never Smoker  . Smokeless tobacco: Never Used  Substance and Sexual Activity  . Alcohol use: Yes    Alcohol/week: 0.0 - 3.0 standard drinks    Comment: occas.  . Drug use: No  . Sexual activity: Yes    Partners: Male    Birth control/protection: Surgical, I.U.D.    Comment: Mirena placed 06/02/18 & BTL  Other Topics Concern  . Not on file  Social History Narrative   Work or School: homemaker      Home Situation: 2 children      Spiritual Beliefs:       Lifestyle: regular exercise   Social Determinants of Health   Financial Resource Strain:   . Difficulty of Paying Living Expenses:   Food Insecurity:   . Worried About Charity fundraiser in the Last Year:   . Arboriculturist in the Last Year:   Transportation Needs:   . Film/video editor (Medical):   Marland Kitchen Lack of Transportation (Non-Medical):   Physical Activity:   . Days of Exercise per Week:   .  Minutes of Exercise per Session:   Stress:   . Feeling of Stress :   Social Connections:   . Frequency of Communication with Friends and Family:   . Frequency of Social Gatherings with Friends and Family:   . Attends Religious Services:   . Active Member of Clubs or Organizations:   . Attends Archivist Meetings:   Marland Kitchen Marital Status:   Intimate Partner Violence:   . Fear of Current or Ex-Partner:   . Emotionally Abused:   Marland Kitchen Physically Abused:   . Sexually Abused:     Review of Systems  All other systems reviewed and are negative.   PHYSICAL EXAMINATION:    BP 110/74 (Cuff Size: Large)   Pulse 66   Temp 97.7 F (36.5 C) (Temporal)   Ht 5' 1.25" (1.556 m)   Wt 188 lb (85.3 kg)   LMP 03/20/2020 (Exact Date)   BMI 35.23 kg/m     General appearance: alert, cooperative and appears stated age Head: Normocephalic, without obvious abnormality, atraumatic Neck: no adenopathy, supple, symmetrical, trachea midline and thyroid normal to inspection and palpation Lungs: clear to auscultation bilaterally Heart: regular rate and rhythm Abdomen: soft, non-tender, no masses,  no organomegaly Extremities: extremities normal, atraumatic, no cyanosis or edema Skin: Skin color, texture, turgor normal. No rashes or lesions Lymph nodes: Cervical, supraclavicular, and axillary nodes normal. No abnormal inguinal nodes palpated Neurologic: Grossly normal  Pelvic: External genitalia:  no lesions              Urethra:  normal appearing urethra with no masses, tenderness or lesions               Bartholins and Skenes: normal                 Vagina: normal appearing vagina with normal color and discharge, no lesions.  Good anterior vaginal support.  First degree low rectocele.                Cervix: no lesions                Bimanual Exam:  Uterus:  normal size, contour, position, consistency, mobility, non-tender              Adnexa: no mass, fullness, tenderness              Rectal exam: Yes.  .  Confirms.                Anus:  normal sphincter tone, no lesions  Chaperone was present for exam.  ASSESSMENT  Rectocele.  Stress incontinence.  Menorrhagia with irregular menses.  Presumed adenomyosis.  Heterozygous for Factor V Leiden.  Saliva testing.  No prior blood clots.   PLAN  We reviewed her urodynamic test results. Surgical care with TVT Exact midurethral sling/cystoscopy, rectocele repair discussed in detail.  I highlighted that permanent mesh materials are currently the standard of care for the midurethral sling.  I quoted slings being 28 - 90% effective in treatment of stress incontinence and reviewed risk of slings include but are not limited to erosions and exposure, cystotomy, urinary retention and slower voiding, increase in urgency symptoms, need for prolonged catheterization or self catheterization, urinary tract infections, bleeding, infection, damage to surrounding organs, reaction to anesthesia, DVT, PE, death, need for reoperation, and recurrence of incontinence.  We discussed dyspareunia and a tight vagina as potential specific risks to the rectocele repair.   She wishes to proceed with prolapse  and incontinence surgery concurrently with her hysterectomy procedure. Care will proceed at the time of her hysterectomy with [name].   An After Visit Summary was printed and given to the patient.  __45____ minutes face to face time of which over 50% was spent in counseling.   ADDENDUM - urodynamic testing results 03/20/20  Uroflow - continuous.   Void 333 cc.  PVR 70 cc.  CMG - S1 198 cc, S2 296 cc, S3 562 cc.  VLPP - 146 cm H2O at 565 cc.  Possible detrusor contraction.  UPP - 23 cm H2O. Pressure flow - PDet max 5 cm H2O.  Voided 808 cc.

## 2020-04-02 ENCOUNTER — Encounter: Payer: Self-pay | Admitting: Obstetrics and Gynecology

## 2020-04-02 ENCOUNTER — Ambulatory Visit (INDEPENDENT_AMBULATORY_CARE_PROVIDER_SITE_OTHER): Payer: BC Managed Care – PPO | Admitting: Obstetrics and Gynecology

## 2020-04-02 VITALS — BP 110/74 | HR 66 | Temp 97.7°F | Ht 61.25 in | Wt 188.0 lb

## 2020-04-02 DIAGNOSIS — N393 Stress incontinence (female) (male): Secondary | ICD-10-CM | POA: Diagnosis not present

## 2020-04-02 DIAGNOSIS — N816 Rectocele: Secondary | ICD-10-CM

## 2020-04-04 ENCOUNTER — Other Ambulatory Visit (HOSPITAL_COMMUNITY): Payer: Self-pay

## 2020-04-04 ENCOUNTER — Other Ambulatory Visit: Payer: Self-pay

## 2020-04-04 ENCOUNTER — Ambulatory Visit
Admission: RE | Admit: 2020-04-04 | Discharge: 2020-04-04 | Disposition: A | Discharge status: Home / Self Care | Payer: BC Managed Care – PPO | Source: Ambulatory Visit | Attending: Obstetrics & Gynecology | Admitting: Obstetrics & Gynecology

## 2020-04-04 ENCOUNTER — Telehealth: Payer: Self-pay

## 2020-04-04 ENCOUNTER — Encounter: Payer: Self-pay | Admitting: Obstetrics & Gynecology

## 2020-04-04 ENCOUNTER — Ambulatory Visit (INDEPENDENT_AMBULATORY_CARE_PROVIDER_SITE_OTHER): Payer: BC Managed Care – PPO | Admitting: Obstetrics & Gynecology

## 2020-04-04 VITALS — BP 110/76 | HR 70 | Temp 97.9°F | Resp 16 | Wt 187.0 lb

## 2020-04-04 DIAGNOSIS — R1011 Right upper quadrant pain: Secondary | ICD-10-CM

## 2020-04-04 DIAGNOSIS — R109 Unspecified abdominal pain: Secondary | ICD-10-CM | POA: Diagnosis not present

## 2020-04-04 DIAGNOSIS — R102 Pelvic and perineal pain: Secondary | ICD-10-CM | POA: Diagnosis not present

## 2020-04-04 LAB — POCT URINALYSIS DIPSTICK
Bilirubin, UA: NEGATIVE
Blood, UA: NEGATIVE
Glucose, UA: NEGATIVE
Ketones, UA: NEGATIVE
Leukocytes, UA: NEGATIVE
Nitrite, UA: NEGATIVE
Protein, UA: NEGATIVE
Urobilinogen, UA: NEGATIVE E.U./dL — AB
pH, UA: 5 (ref 5.0–8.0)

## 2020-04-04 MED ORDER — IOPAMIDOL (ISOVUE-300) INJECTION 61%
100.0000 mL | Freq: Once | INTRAVENOUS | Status: AC | PRN
  Administered 2020-04-04: 15:00:00 100 mL via INTRAVENOUS

## 2020-04-04 NOTE — Progress Notes (Unsigned)
Spoke with patient CT abdomen pelvis w contrast has been approved. Spoke with [facility] and patient is to walk into [place] [address] to have CT done at this time. Patient is agreeable and will head to get imaging performed. Patient is aware if she is expected to be later than 3 pm at [place] to contact the office so that I may work with rescheduling her [place] test for surgery. Patient verbalizes understanding.

## 2020-04-04 NOTE — Telephone Encounter (Signed)
Spoke with patient. COVID test for today cancelled as patient is still at [place]. COVID test rescheduled for 04/05/2020 at 8:50 am at [facility] location. Patient is aware of the need to quarantine after test until surgery. Aware I will contact [facility] to confirm it is okay that COVID test was moved to 04/05/2020.

## 2020-04-04 NOTE — Progress Notes (Signed)
GYNECOLOGY  VISIT  CC:   Right sided pain  HPI: 44 y.o. G46P2002 Married White or Caucasian female here for right side pelvic pain that has been present for about 10 days.  She cannot related the pain to anything she is eating.  Woke up with pain this morning.  Typically notices more when she is sitting and less when she is standing.  She is doing more sitting in the car with pick up from school with her kids.  Hasn't tried anything for this.  She does have some constipation even with daily BMs.  Denies urinary symptoms.    GYNECOLOGIC HISTORY: Patient's last menstrual period was 03/20/2020 (exact date). Contraception: btl Menopausal hormone therapy: none poct urine-neg  Patient Active Problem List   Diagnosis Date Noted  . Trochanteric bursitis of left hip 01/12/2020  . Patellofemoral arthritis of right knee 05/11/2018  . Lumbar radiculopathy 03/22/2018  . Nonallopathic lesion of thoracic region 10/07/2017  . Nonallopathic lesion of cervical region 10/07/2017  . Nonallopathic lesion of lumbosacral region 10/07/2017  . Piriformis syndrome of right side 08/24/2017  . Polyarthropathy or polyarthritis of multiple sites 08/24/2017  . Closed right tarsal navicular fracture 08/02/2017  . 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 Mild episode of recurrent major depressive disorder ([place]) 02/16/2017  . Attention deficit hyperactivity disorder (ADHD) 02/16/2017  . Flatulence 02/16/2017  . BMI 33.0-33.9,adult 02/16/2017  . Psoriasis 02/16/2017  . Heterozygous factor V Leiden mutation ([place]) 09/01/2016  . Headache, menstrual migraine 06/07/2012  . Malignant melanoma ([place]) 06/07/2012  . Dysmenorrhea 06/07/2012    Past Medical History:  Diagnosis Date  . Anemia   . Anxiety   . Attention deficit hyperactivity disorder (ADHD) 02/16/2017   Sees [facility] for managment  . Calculus of kidney 06/07/2012   Overview:  Dr Karsten Ro - urology   . Cancer ([place])    melanoma removed from abdomen in 2000  . Complication of anesthesia    low BP during C-section  . Depression   . Factor V Leiden carrier ([place])    heterozygous  . Hernia, rectovaginal 06/07/2012   Overview:  Dr Corinna Capra - gyn   . History of abnormal cervical Pap smear 1997   dysplasia- LEEP  . Migraine   . Ovarian cyst 1994  . Psoriasis 02/16/2017   -sees dermatologist  . Rectocele     Past Surgical History:  Procedure Laterality Date  . CERVICAL BIOPSY  W/ LOOP ELECTRODE EXCISION    . CESAREAN SECTION  2007 & 2010  . KNEE ARTHROPLASTY Right 1989  . OVARIAN CYST SURGERY  1994  . SINUS ENDO WITH FUSION    . TUBAL LIGATION  2010  . VULVA /PERINEUM BIOPSY  01/2015    MEDS:   Current Outpatient Medications on File Prior to Visit  Medication Sig Dispense Refill  . olopatadine (PATADAY) 0.1 % ophthalmic solution Place 1 drop into both eyes daily.    . Secukinumab (COSENTYX SENSOREADY PEN) 150 MG/ML SOAJ Inject 300 mg into the skin every 30 (thirty) days.     Marland Kitchen tretinoin (RETIN-A) 0.05 % cream Apply 1 application topically daily as needed (acne).     . venlafaxine XR (EFFEXOR-XR) 37.5 MG 24 hr capsule TAKE 1 CAPSULE DAILY WITH BREAKFAST. (Patient taking differently: Take 37.5 mg by mouth daily with breakfast. ) 90 capsule 1  . ferrous sulfate 325 (65 FE) MG tablet Take 325 mg by mouth daily with breakfast.    . meloxicam (  MOBIC) 15 MG tablet Take 15 mg by mouth daily as needed for pain.    . Multiple Vitamin (MULTIVITAMIN WITH MINERALS) TABS tablet Take 1 tablet by mouth daily.    Marland Kitchen Ubrogepant (UBRELVY) 100 MG TABS Take 100 mg by mouth daily as needed (migraine).      No current facility-administered medications on file prior to visit.    ALLERGIES: Patient has no known allergies.  Family History  Problem Relation Age of Onset  . Esophageal cancer Father   . Esophageal cancer Maternal Uncle   . Rectal cancer Maternal Uncle   . Diabetes Maternal Grandmother   . Heart disease  Maternal Grandmother     SH:  Married, non smoker  Review of Systems  Constitutional: Negative.   HENT: Negative.   Eyes: Negative.   Respiratory: Negative.   Cardiovascular: Negative.   Gastrointestinal: Negative.   Endocrine: Negative.   Genitourinary:       Right side pelvic pain  Musculoskeletal: Negative.   Skin: Negative.   Allergic/Immunologic: Negative.   Neurological: Negative.   Psychiatric/Behavioral: Negative.     PHYSICAL EXAMINATION:    BP 110/76   Pulse 70   Temp 97.9 F (36.6 C) (Skin)   Resp 16   Wt 187 lb (84.8 kg)   LMP 03/20/2020 (Exact Date)   BMI 35.05 kg/m     General appearance: alert, cooperative and appears stated age CV:  Regular rate and rhythm Lungs:  clear to auscultation, no wheezes, rales or rhonchi, symmetric air entry Abdomen: RUQ tenderness with guarding bowel sounds normal; no masses,  no organomegaly Lymph:  no inguinal LAD noted  Chaperone, Terence Lux, CMA, was present for exam.  Assessment: RUQ pain with guarding  Plan: Plan CT abd/pelvis ASAP as surgery is scheduled on Monday

## 2020-04-04 NOTE — Telephone Encounter (Signed)
Patient called wanting to speak with [facility] regarding surgery scheduled for Monday 04/08/20. Patient also stated she is experiencing pain in right side and would like to discuss this issue before having surgery.

## 2020-04-04 NOTE — Telephone Encounter (Signed)
Patient called in regards to covid test before surgery. Patient states she is still at [facility] and will not make it to her covid test appointment. Patient would like a call back.

## 2020-04-04 NOTE — Telephone Encounter (Signed)
Spoke with patient. Patient states that on 03/25/2020 she began having a dull nagging pain to the right of her belly button. Pain is intermittent and is never sharp. Denies nausea, vomiting, fever. Chills, BM changes, diet changes, or any heavy lifting. Reports area is tender to the touch. Denies any change in appearance to her abdomin when laying flat or standing. Patient is scheduled to have a TLH/BS/Rectocele repair, poss TVT midurethral sling/cysto on 04/08/2020. Had PUS on 03/21/2020 which showed Uterus:  8 x 5.5 x 4.0cm with 7.3mm endometrium.  IUD is located in cervix today.  Left ovary is 3 x 2.2 x 1.4cm and right ovary is 4.5 x 2.3 x 4.4cm with simple, thin walled ovarian cyst measuring 3.6 x 4.4 x 3cm cyst.  EMB on 03/21/2020 was normal. IUD was removed in office on 03/21/2020. Advised will review with Dr.Miller and return call with additional recommendations.

## 2020-04-04 NOTE — Telephone Encounter (Signed)
Spoke with patient after reviewing with Dr.Miller. Appointment scheduled for today at 11:30 am per Dr.Miller. Patient is agreeable to date and time.  Routing to provider and will close encounter.

## 2020-04-05 ENCOUNTER — Other Ambulatory Visit (HOSPITAL_COMMUNITY)
Admission: RE | Admit: 2020-04-05 | Discharge: 2020-04-05 | Disposition: A | Discharge status: Home / Self Care | Payer: BC Managed Care – PPO | Source: Ambulatory Visit | Attending: Obstetrics & Gynecology | Admitting: Obstetrics & Gynecology

## 2020-04-05 DIAGNOSIS — Z01812 Encounter for preprocedural laboratory examination: Secondary | ICD-10-CM | POA: Insufficient documentation

## 2020-04-05 DIAGNOSIS — Z20822 Contact with and (suspected) exposure to covid-19: Secondary | ICD-10-CM | POA: Diagnosis not present

## 2020-04-05 LAB — SARS CORONAVIRUS 2 (TAT 6-24 HRS): SARS Coronavirus 2: NEGATIVE

## 2020-04-07 NOTE — H&P (Signed)
Office Visit  04/02/2020 Madrone Silva, Everardo All, MD Obstetrics and Gynecology  Stress incontinence +1 more Dx  Surgical Consult ; Referred by Fanny Bien, MD Reason for Visit  Additional Documentation  Vitals:    BP 110/74 (Cuff Size: Large)  Pulse 66  Temp 97.7 F (36.5 C) (Temporal)  Ht 5' 1.25" (1.556 m)  Wt 85.3 kg  LMP 03/20/2020 (Exact Date)  BMI 35.23 kg/m  BSA 1.92 m    More Vitals  Flowsheets:    MEWS Score,  Anthropometrics,  NEWS,  Method of Visit    Encounter Info:   Billing Info,  History,  Allergies,  Detailed Report    Orthostatic Vitals Recorded in This Encounter   04/02/2020  1132     Cuff Size: Large  All Notes   Progress Notes by Nunzio Cobbs, MD at 04/02/2020 11:30 AM Author: Nunzio Cobbs, MD Author Type: Physician Filed: 04/07/2020  9:53 PM  Note Status: Addendum Cosign: Cosign Not Required Encounter Date: 04/02/2020  Editor: Nunzio Cobbs, MD (Physician)  Prior Versions: 1. Nunzio Cobbs, MD (Physician) at 04/05/2020 12:30 PM - Signed   2. Lowella Fairy, CMA (Certified Psychologist, sport and exercise) at 04/02/2020 11:38 AM - Sign when Signing Visit    GYNECOLOGY  VISIT   HPI: 44 y.o.   Married  Caucasian  female   G2P2002 with Patient's last menstrual period was 03/20/2020 (exact date).   here to discuss results of urodynamics testing and surgical consult.     Patient is referred by [name], her primary GYN. Patient is having menorrhagia with irregular menses and is planning for hysterectomy for definitive treatment.  She has presumptive adenomyosis.  Status post tubal ligation.    Patient has a history of a rectocele since prior to childbearing.  She has done pelvic floor therapy in the past.    She has protrusion of the vagina when she has a BM.  She has BMs sometimes twice a day.  She does rare splinting.  Denies fecal soiling.      Leakage of urine with cough, laugh sometimes with a full bladder.  She has a hard time getting to the bathroom when she is ready to void.  She cannot stop her flow.  No leak with exercise, but is doing low impact exercise.  DF in am every 30 - 45 min.  DF in afternoon spaces out more.    Urodynamic testing confirms stress incontinence.    C/S x 2.  Had trial of labor and failure to descend with first child.  States she had low blood pressure with her second C/S.   She states she has a chronic cough due to allergies.    GYNECOLOGIC HISTORY: Patient's last menstrual period was 03/20/2020 (exact date). Contraception: Tubal Menopausal hormone therapy:  none Last mammogram:02-06-20 3D/Rt.Br.poss.asymmetry, Lt.Br.neg/density B;Diag.Rt.and Rt.U/S--Persistent right breast asymmetry with imaging features compatible with hormonally and caffeine stimulated fibroglandular tissue/6 mo.f/u Rt.Diag/US Last pap smear: 06-06-19 Neg:Neg HR HPV, 05-10-18 Neg, 01-14-16 Neg:Neg HR HPV                OB History     Gravida  2   Para  2   Term  2   Preterm  0   AB  0   Living  2      SAB  0   TAB  0   Ectopic  0   Multiple  0   Live Births  2                     Patient Active Problem List    Diagnosis Date Noted  . Trochanteric bursitis of left hip 01/12/2020  . Patellofemoral arthritis of right knee 05/11/2018  . Lumbar radiculopathy 03/22/2018  . Nonallopathic lesion of thoracic region 10/07/2017  . Nonallopathic lesion of cervical region 10/07/2017  . Nonallopathic lesion of lumbosacral region 10/07/2017  . Piriformis syndrome of right side 08/24/2017  . Polyarthropathy or polyarthritis of multiple sites 08/24/2017  . Closed right tarsal navicular fracture 08/02/2017  . 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 Mild episode of recurrent major depressive disorder ([place]) 02/16/2017  . Attention deficit hyperactivity disorder (ADHD) 02/16/2017  . Flatulence  02/16/2017  . BMI 33.0-33.9,adult 02/16/2017  . Psoriasis 02/16/2017  . Heterozygous factor V Leiden mutation ([place]) 09/01/2016  . Headache, menstrual migraine 06/07/2012  . Malignant melanoma ([place]) 06/07/2012  . Dysmenorrhea 06/07/2012          Past Medical History:  Diagnosis Date  . Anemia    . Anxiety    . Attention deficit hyperactivity disorder (ADHD) 02/16/2017    Sees [facility] for managment  . Calculus of kidney 06/07/2012    Overview:  Dr Karsten Ro - urology   . Cancer ([place])      melanoma removed from abdomen in 2000  . Complication of anesthesia      low BP during C-section  . Depression    . Factor V Leiden carrier ([place])      heterozygous  . Hernia, rectovaginal 06/07/2012    Overview:  Dr Corinna Capra - gyn   . History of abnormal cervical Pap smear 1997    dysplasia- LEEP  . Migraine    . Ovarian cyst 1994  . Psoriasis 02/16/2017    -sees dermatologist  . Rectocele             Past Surgical History:  Procedure Laterality Date  . CERVICAL BIOPSY  W/ LOOP ELECTRODE EXCISION      . CESAREAN SECTION   2007 & 2010  . KNEE ARTHROPLASTY Right 1989  . OVARIAN CYST SURGERY   1994  . SINUS ENDO WITH FUSION      . TUBAL LIGATION   2010  . VULVA /PERINEUM BIOPSY   01/2015            Current Outpatient Medications  Medication Sig Dispense Refill  . meloxicam (MOBIC) 15 MG tablet Take 15 mg by mouth daily as needed for pain.      Marland Kitchen olopatadine (PATADAY) 0.1 % ophthalmic solution Place 1 drop into both eyes daily.      . Secukinumab (COSENTYX SENSOREADY PEN) 150 MG/ML SOAJ Inject 300 mg into the skin every 30 (thirty) days.       Marland Kitchen tretinoin (RETIN-A) 0.05 % cream Apply 1 application topically daily as needed (acne).       . Ubrogepant (UBRELVY) 100 MG TABS Take 100 mg by mouth daily as needed (migraine).       . venlafaxine XR (EFFEXOR-XR) 37.5 MG 24 hr capsule TAKE 1 CAPSULE DAILY WITH BREAKFAST. (Patient taking differently: Take 37.5 mg by mouth daily with  breakfast. ) 90 capsule 1  . ferrous sulfate 325 (65 FE) MG tablet Take 325 mg by mouth daily with breakfast.      . Multiple Vitamin (MULTIVITAMIN WITH  MINERALS) TABS tablet Take 1 tablet by mouth daily.        No current facility-administered medications for this visit.      ALLERGIES: Patient has no known allergies.        Family History  Problem Relation Age of Onset  . Esophageal cancer Father    . Esophageal cancer Maternal Uncle    . Rectal cancer Maternal Uncle    . Diabetes Maternal Grandmother    . Heart disease Maternal Grandmother        Social History         Socioeconomic History  . Marital status: Married      Spouse name: Not on file  . Number of children: Not on file  . Years of education: Not on file  . Highest education level: Not on file  Occupational History  . Not on file  Tobacco Use  . Smoking status: Never Smoker  . Smokeless tobacco: Never Used  Substance and Sexual Activity  . Alcohol use: Yes      Alcohol/week: 0.0 - 3.0 standard drinks      Comment: occas.  . Drug use: No  . Sexual activity: Yes      Partners: Male      Birth control/protection: Surgical, I.U.D.      Comment: Mirena placed 06/02/18 & BTL  Other Topics Concern  . Not on file  Social History Narrative    Work or School: homemaker         Home Situation: 2 children         Spiritual Beliefs:         Lifestyle: regular exercise    Social Determinants of Health       Financial Resource Strain:   . Difficulty of Paying Living Expenses:   Food Insecurity:   . Worried About Charity fundraiser in the Last Year:   . Arboriculturist in the Last Year:   Transportation Needs:   . Film/video editor (Medical):   Marland Kitchen Lack of Transportation (Non-Medical):   Physical Activity:   . Days of Exercise per Week:   . Minutes of Exercise per Session:   Stress:   . Feeling of Stress :   Social Connections:   . Frequency of Communication with Friends and Family:   .  Frequency of Social Gatherings with Friends and Family:   . Attends Religious Services:   . Active Member of Clubs or Organizations:   . Attends Archivist Meetings:   Marland Kitchen Marital Status:   Intimate Partner Violence:   . Fear of Current or Ex-Partner:   . Emotionally Abused:   Marland Kitchen Physically Abused:   . Sexually Abused:       Review of Systems  All other systems reviewed and are negative.     PHYSICAL EXAMINATION:     BP 110/74 (Cuff Size: Large)   Pulse 66   Temp 97.7 F (36.5 C) (Temporal)   Ht 5' 1.25" (1.556 m)   Wt 188 lb (85.3 kg)   LMP 03/20/2020 (Exact Date)   BMI 35.23 kg/m     General appearance: alert, cooperative and appears stated age Head: Normocephalic, without obvious abnormality, atraumatic Neck: no adenopathy, supple, symmetrical, trachea midline and thyroid normal to inspection and palpation Lungs: clear to auscultation bilaterally Heart: regular rate and rhythm Abdomen: soft, non-tender, no masses,  no organomegaly Extremities: extremities normal, atraumatic, no cyanosis or edema Skin: Skin color, texture, turgor normal. No  rashes or lesions Lymph nodes: Cervical, supraclavicular, and axillary nodes normal. No abnormal inguinal nodes palpated Neurologic: Grossly normal   Pelvic: External genitalia:  no lesions              Urethra:  normal appearing urethra with no masses, tenderness or lesions              Bartholins and Skenes: normal                 Vagina: normal appearing vagina with normal color and discharge, no lesions.  Good anterior vaginal support.  First degree low rectocele.                Cervix: no lesions                Bimanual Exam:  Uterus:  normal size, contour, position, consistency, mobility, non-tender              Adnexa: no mass, fullness, tenderness              Rectal exam: Yes.  .  Confirms.                Anus:  normal sphincter tone, no lesions   Chaperone was present for exam.   ASSESSMENT   Rectocele.    Stress incontinence.  Menorrhagia with irregular menses.  Presumed adenomyosis.  Heterozygous for Factor V Leiden.  Saliva testing.  No prior blood clots.    PLAN   We reviewed her urodynamic test results. Surgical care with TVT Exact midurethral sling/cystoscopy, rectocele repair discussed in detail.  I highlighted that permanent mesh materials are currently the standard of care for the midurethral sling.  I quoted slings being 40 - 90% effective in treatment of stress incontinence and reviewed risk of slings include but are not limited to erosions and exposure, cystotomy, urinary retention and slower voiding, increase in urgency symptoms, need for prolonged catheterization or self catheterization, urinary tract infections, bleeding, infection, damage to surrounding organs, reaction to anesthesia, DVT, PE, death, need for reoperation, and recurrence of incontinence.  We discussed dyspareunia and a tight vagina as potential specific risks to the rectocele repair.   She wishes to proceed with prolapse and incontinence surgery concurrently with her hysterectomy procedure. Care will proceed at the time of her hysterectomy with [name].   An After Visit Summary was printed and given to the patient.   __45____ minutes face to face time of which over 50% was spent in counseling.

## 2020-04-08 ENCOUNTER — Ambulatory Visit (HOSPITAL_BASED_OUTPATIENT_CLINIC_OR_DEPARTMENT_OTHER)
Admission: RE | Admit: 2020-04-08 | Discharge: 2020-04-09 | Disposition: A | Discharge status: Home / Self Care | Payer: BC Managed Care – PPO | Source: Other Acute Inpatient Hospital | Attending: Obstetrics & Gynecology | Admitting: Obstetrics & Gynecology

## 2020-04-08 ENCOUNTER — Encounter (HOSPITAL_BASED_OUTPATIENT_CLINIC_OR_DEPARTMENT_OTHER)
Admission: RE | Disposition: A | Discharge status: Home / Self Care | Payer: Self-pay | Source: Other Acute Inpatient Hospital | Attending: Obstetrics & Gynecology

## 2020-04-08 ENCOUNTER — Encounter (HOSPITAL_BASED_OUTPATIENT_CLINIC_OR_DEPARTMENT_OTHER): Payer: Self-pay | Admitting: Obstetrics & Gynecology

## 2020-04-08 ENCOUNTER — Ambulatory Visit (HOSPITAL_BASED_OUTPATIENT_CLINIC_OR_DEPARTMENT_OTHER): Payer: BC Managed Care – PPO | Admitting: Anesthesiology

## 2020-04-08 ENCOUNTER — Other Ambulatory Visit: Payer: Self-pay

## 2020-04-08 DIAGNOSIS — N926 Irregular menstruation, unspecified: Secondary | ICD-10-CM | POA: Diagnosis not present

## 2020-04-08 DIAGNOSIS — D251 Intramural leiomyoma of uterus: Secondary | ICD-10-CM | POA: Diagnosis not present

## 2020-04-08 DIAGNOSIS — N3946 Mixed incontinence: Secondary | ICD-10-CM | POA: Diagnosis not present

## 2020-04-08 DIAGNOSIS — N800 Endometriosis of uterus: Secondary | ICD-10-CM | POA: Insufficient documentation

## 2020-04-08 DIAGNOSIS — Z8742 Personal history of other diseases of the female genital tract: Secondary | ICD-10-CM | POA: Diagnosis not present

## 2020-04-08 DIAGNOSIS — D649 Anemia, unspecified: Secondary | ICD-10-CM | POA: Diagnosis not present

## 2020-04-08 DIAGNOSIS — N812 Incomplete uterovaginal prolapse: Secondary | ICD-10-CM | POA: Diagnosis not present

## 2020-04-08 DIAGNOSIS — G43909 Migraine, unspecified, not intractable, without status migrainosus: Secondary | ICD-10-CM | POA: Diagnosis not present

## 2020-04-08 DIAGNOSIS — N816 Rectocele: Secondary | ICD-10-CM | POA: Diagnosis not present

## 2020-04-08 DIAGNOSIS — N921 Excessive and frequent menstruation with irregular cycle: Secondary | ICD-10-CM | POA: Diagnosis not present

## 2020-04-08 DIAGNOSIS — N83202 Unspecified ovarian cyst, left side: Secondary | ICD-10-CM | POA: Insufficient documentation

## 2020-04-08 DIAGNOSIS — F419 Anxiety disorder, unspecified: Secondary | ICD-10-CM | POA: Insufficient documentation

## 2020-04-08 DIAGNOSIS — D6851 Activated protein C resistance: Secondary | ICD-10-CM | POA: Diagnosis not present

## 2020-04-08 DIAGNOSIS — Z79899 Other long term (current) drug therapy: Secondary | ICD-10-CM | POA: Diagnosis not present

## 2020-04-08 DIAGNOSIS — Z9851 Tubal ligation status: Secondary | ICD-10-CM | POA: Diagnosis not present

## 2020-04-08 DIAGNOSIS — N83201 Unspecified ovarian cyst, right side: Secondary | ICD-10-CM | POA: Insufficient documentation

## 2020-04-08 DIAGNOSIS — L409 Psoriasis, unspecified: Secondary | ICD-10-CM | POA: Diagnosis not present

## 2020-04-08 DIAGNOSIS — R110 Nausea: Secondary | ICD-10-CM | POA: Insufficient documentation

## 2020-04-08 DIAGNOSIS — Z791 Long term (current) use of non-steroidal anti-inflammatories (NSAID): Secondary | ICD-10-CM | POA: Insufficient documentation

## 2020-04-08 DIAGNOSIS — Z9889 Other specified postprocedural states: Secondary | ICD-10-CM

## 2020-04-08 DIAGNOSIS — F329 Major depressive disorder, single episode, unspecified: Secondary | ICD-10-CM | POA: Diagnosis not present

## 2020-04-08 DIAGNOSIS — N393 Stress incontinence (female) (male): Secondary | ICD-10-CM | POA: Diagnosis not present

## 2020-04-08 DIAGNOSIS — Z85820 Personal history of malignant melanoma of skin: Secondary | ICD-10-CM | POA: Diagnosis not present

## 2020-04-08 DIAGNOSIS — F418 Other specified anxiety disorders: Secondary | ICD-10-CM | POA: Diagnosis not present

## 2020-04-08 DIAGNOSIS — N72 Inflammatory disease of cervix uteri: Secondary | ICD-10-CM | POA: Diagnosis not present

## 2020-04-08 DIAGNOSIS — N809 Endometriosis, unspecified: Secondary | ICD-10-CM | POA: Diagnosis not present

## 2020-04-08 DIAGNOSIS — N920 Excessive and frequent menstruation with regular cycle: Secondary | ICD-10-CM | POA: Diagnosis present

## 2020-04-08 HISTORY — PX: BLADDER SUSPENSION: SHX72

## 2020-04-08 HISTORY — PX: RECTOCELE REPAIR: SHX761

## 2020-04-08 HISTORY — PX: TOTAL LAPAROSCOPIC HYSTERECTOMY WITH SALPINGECTOMY: SHX6742

## 2020-04-08 HISTORY — PX: CYSTOSCOPY: SHX5120

## 2020-04-08 LAB — CBC
HCT: 37.3 % (ref 36.0–46.0)
Hemoglobin: 11.9 g/dL — ABNORMAL LOW (ref 12.0–15.0)
MCH: 28.2 pg (ref 26.0–34.0)
MCHC: 31.9 g/dL (ref 30.0–36.0)
MCV: 88.4 fL (ref 80.0–100.0)
Platelets: 175 10*3/uL (ref 150–400)
RBC: 4.22 MIL/uL (ref 3.87–5.11)
RDW: 16.8 % — ABNORMAL HIGH (ref 11.5–15.5)
WBC: 10.1 10*3/uL (ref 4.0–10.5)
nRBC: 0 % (ref 0.0–0.2)

## 2020-04-08 LAB — POCT PREGNANCY, URINE: Preg Test, Ur: NEGATIVE

## 2020-04-08 SURGERY — HYSTERECTOMY, TOTAL, LAPAROSCOPIC, WITH SALPINGECTOMY
Anesthesia: General | Site: Vagina

## 2020-04-08 MED ORDER — FENTANYL CITRATE (PF) 100 MCG/2ML IJ SOLN: 25.0000 ug | INTRAMUSCULAR | Status: DC | PRN

## 2020-04-08 MED ORDER — PROMETHAZINE HCL 25 MG/ML IJ SOLN: 12.5000 mg | Freq: Four times a day (QID) | INTRAMUSCULAR | Status: DC | PRN

## 2020-04-08 MED ORDER — FENTANYL CITRATE (PF) 100 MCG/2ML IJ SOLN
INTRAMUSCULAR | Status: DC | PRN
  Administered 2020-04-08: 25 ug via INTRAVENOUS
  Administered 2020-04-08: 50 ug via INTRAVENOUS
  Administered 2020-04-08: 25 ug via INTRAVENOUS
  Administered 2020-04-08 (×3): 50 ug via INTRAVENOUS

## 2020-04-08 MED ORDER — ENOXAPARIN SODIUM 40 MG/0.4ML ~~LOC~~ SOLN: 40.0000 mg | SUBCUTANEOUS | Status: DC

## 2020-04-08 MED ORDER — HYDROMORPHONE HCL 2 MG PO TABS
ORAL_TABLET | ORAL | Status: AC
  Filled 2020-04-08: qty 1

## 2020-04-08 MED ORDER — ACETAMINOPHEN 500 MG PO TABS
1000.0000 mg | ORAL_TABLET | ORAL | Status: AC
  Administered 2020-04-08: 1000 mg via ORAL

## 2020-04-08 MED ORDER — BUPIVACAINE HCL (PF) 0.25 % IJ SOLN
INTRAMUSCULAR | Status: DC | PRN
  Administered 2020-04-08: 10 mL

## 2020-04-08 MED ORDER — SCOPOLAMINE 1 MG/3DAYS TD PT72: 1.0000 | MEDICATED_PATCH | TRANSDERMAL | Status: DC

## 2020-04-08 MED ORDER — ENOXAPARIN SODIUM 40 MG/0.4ML ~~LOC~~ SOLN
SUBCUTANEOUS | Status: AC
  Filled 2020-04-08: qty 0.4

## 2020-04-08 MED ORDER — GABAPENTIN 100 MG PO CAPS
ORAL_CAPSULE | ORAL | Status: AC
  Filled 2020-04-08: qty 1

## 2020-04-08 MED ORDER — SODIUM CHLORIDE 0.9 % IV SOLN
INTRAVENOUS | Status: AC | PRN
  Administered 2020-04-08: 1000 mL

## 2020-04-08 MED ORDER — ACETAMINOPHEN 500 MG PO TABS
ORAL_TABLET | ORAL | Status: AC
  Filled 2020-04-08: qty 2

## 2020-04-08 MED ORDER — FENTANYL CITRATE (PF) 250 MCG/5ML IJ SOLN
INTRAMUSCULAR | Status: AC
  Filled 2020-04-08: qty 5

## 2020-04-08 MED ORDER — ESTRADIOL 0.1 MG/GM VA CREA
TOPICAL_CREAM | VAGINAL | Status: DC | PRN
  Administered 2020-04-08: 1 via VAGINAL

## 2020-04-08 MED ORDER — HYDROMORPHONE HCL 1 MG/ML IJ SOLN
INTRAMUSCULAR | Status: DC | PRN
  Administered 2020-04-08 (×2): .5 mg via INTRAVENOUS

## 2020-04-08 MED ORDER — KETAMINE HCL 10 MG/ML IJ SOLN
INTRAMUSCULAR | Status: DC | PRN
  Administered 2020-04-08 (×5): 10 mg via INTRAVENOUS

## 2020-04-08 MED ORDER — ONDANSETRON HCL 4 MG PO TABS: 4.0000 mg | ORAL_TABLET | Freq: Four times a day (QID) | ORAL | Status: DC | PRN

## 2020-04-08 MED ORDER — MORPHINE SULFATE (PF) 2 MG/ML IV SOLN
INTRAVENOUS | Status: AC
  Filled 2020-04-08: qty 1

## 2020-04-08 MED ORDER — PROPOFOL 10 MG/ML IV BOLUS
INTRAVENOUS | Status: AC
  Filled 2020-04-08: qty 20

## 2020-04-08 MED ORDER — PANTOPRAZOLE SODIUM 40 MG IV SOLR
40.0000 mg | Freq: Every day | INTRAVENOUS | Status: DC
  Administered 2020-04-08: 40 mg via INTRAVENOUS

## 2020-04-08 MED ORDER — MIDAZOLAM HCL 2 MG/2ML IJ SOLN
INTRAMUSCULAR | Status: AC
  Filled 2020-04-08: qty 2

## 2020-04-08 MED ORDER — PANTOPRAZOLE SODIUM 40 MG IV SOLR
INTRAVENOUS | Status: AC
  Filled 2020-04-08: qty 40

## 2020-04-08 MED ORDER — ROCURONIUM BROMIDE 100 MG/10ML IV SOLN
INTRAVENOUS | Status: DC | PRN
  Administered 2020-04-08: 10 mg via INTRAVENOUS
  Administered 2020-04-08: 5 mg via INTRAVENOUS
  Administered 2020-04-08: 80 mg via INTRAVENOUS
  Administered 2020-04-08: 15 mg via INTRAVENOUS

## 2020-04-08 MED ORDER — SODIUM CHLORIDE 0.9 % IV SOLN
INTRAVENOUS | Status: DC | PRN
  Administered 2020-04-08: 09:00:00 120 mL

## 2020-04-08 MED ORDER — PROPOFOL 10 MG/ML IV BOLUS
INTRAVENOUS | Status: DC | PRN
  Administered 2020-04-08: 160 mg via INTRAVENOUS

## 2020-04-08 MED ORDER — ENOXAPARIN SODIUM 40 MG/0.4ML ~~LOC~~ SOLN
40.0000 mg | SUBCUTANEOUS | Status: AC
  Administered 2020-04-08: 40 mg via SUBCUTANEOUS

## 2020-04-08 MED ORDER — ONDANSETRON HCL 4 MG/2ML IJ SOLN
INTRAMUSCULAR | Status: DC | PRN
  Administered 2020-04-08 (×2): 4 mg via INTRAVENOUS

## 2020-04-08 MED ORDER — ONDANSETRON HCL 4 MG/2ML IJ SOLN
INTRAMUSCULAR | Status: AC
  Filled 2020-04-08: qty 2

## 2020-04-08 MED ORDER — ONDANSETRON HCL 4 MG/2ML IJ SOLN
4.0000 mg | Freq: Four times a day (QID) | INTRAMUSCULAR | Status: DC | PRN
  Administered 2020-04-08: 4 mg via INTRAVENOUS

## 2020-04-08 MED ORDER — DEXTROSE-NACL 5-0.45 % IV SOLN
INTRAVENOUS | Status: DC
  Administered 2020-04-08: 125 mL/h via INTRAVENOUS

## 2020-04-08 MED ORDER — KETOROLAC TROMETHAMINE 30 MG/ML IJ SOLN
30.0000 mg | Freq: Four times a day (QID) | INTRAMUSCULAR | Status: DC
  Administered 2020-04-08 – 2020-04-09 (×2): 30 mg via INTRAVENOUS

## 2020-04-08 MED ORDER — SODIUM CHLORIDE 0.9 % IV SOLN
INTRAVENOUS | Status: AC
  Filled 2020-04-08: qty 2

## 2020-04-08 MED ORDER — OXYCODONE-ACETAMINOPHEN 5-325 MG PO TABS: 1.0000 | ORAL_TABLET | ORAL | Status: DC | PRN

## 2020-04-08 MED ORDER — HYDROMORPHONE HCL 2 MG PO TABS
1.0000 mg | ORAL_TABLET | ORAL | Status: DC | PRN
  Administered 2020-04-08 – 2020-04-09 (×5): 1 mg via ORAL
  Filled 2020-04-08: qty 1

## 2020-04-08 MED ORDER — KETAMINE HCL 10 MG/ML IJ SOLN
INTRAMUSCULAR | Status: AC
  Filled 2020-04-08: qty 1

## 2020-04-08 MED ORDER — GABAPENTIN 100 MG PO CAPS
100.0000 mg | ORAL_CAPSULE | ORAL | Status: AC
  Administered 2020-04-08: 100 mg via ORAL

## 2020-04-08 MED ORDER — HYDROMORPHONE HCL 2 MG/ML IJ SOLN
INTRAMUSCULAR | Status: AC
  Filled 2020-04-08: qty 1

## 2020-04-08 MED ORDER — ROCURONIUM BROMIDE 10 MG/ML (PF) SYRINGE
PREFILLED_SYRINGE | INTRAVENOUS | Status: AC
  Filled 2020-04-08: qty 10

## 2020-04-08 MED ORDER — SIMETHICONE 80 MG PO CHEW
80.0000 mg | CHEWABLE_TABLET | Freq: Four times a day (QID) | ORAL | Status: DC | PRN
  Administered 2020-04-09: 80 mg via ORAL

## 2020-04-08 MED ORDER — LACTATED RINGERS IV SOLN: INTRAVENOUS | Status: DC

## 2020-04-08 MED ORDER — MENTHOL 3 MG MT LOZG: 1.0000 | LOZENGE | OROMUCOSAL | Status: DC | PRN

## 2020-04-08 MED ORDER — KETOROLAC TROMETHAMINE 30 MG/ML IJ SOLN
INTRAMUSCULAR | Status: AC
  Filled 2020-04-08: qty 1

## 2020-04-08 MED ORDER — SODIUM CHLORIDE 0.9 % IV SOLN
2.0000 g | INTRAVENOUS | Status: AC
  Administered 2020-04-08: 2 g via INTRAVENOUS

## 2020-04-08 MED ORDER — DEXAMETHASONE SODIUM PHOSPHATE 10 MG/ML IJ SOLN
INTRAMUSCULAR | Status: AC
  Filled 2020-04-08: qty 1

## 2020-04-08 MED ORDER — MIDAZOLAM HCL 5 MG/5ML IJ SOLN
INTRAMUSCULAR | Status: DC | PRN
  Administered 2020-04-08: 2 mg via INTRAVENOUS

## 2020-04-08 MED ORDER — SUGAMMADEX SODIUM 200 MG/2ML IV SOLN
INTRAVENOUS | Status: DC | PRN
  Administered 2020-04-08: 300 mg via INTRAVENOUS

## 2020-04-08 MED ORDER — SODIUM CHLORIDE 0.9 % IV SOLN
2.0000 g | Freq: Once | INTRAVENOUS | Status: AC
  Administered 2020-04-08: 2 g via INTRAVENOUS

## 2020-04-08 MED ORDER — DEXAMETHASONE SODIUM PHOSPHATE 10 MG/ML IJ SOLN
INTRAMUSCULAR | Status: DC | PRN
  Administered 2020-04-08: 4 mg via INTRAVENOUS

## 2020-04-08 MED ORDER — MORPHINE SULFATE (PF) 4 MG/ML IV SOLN
1.0000 mg | INTRAVENOUS | Status: DC | PRN
  Administered 2020-04-08 (×2): 1 mg via INTRAVENOUS

## 2020-04-08 MED ORDER — KETOROLAC TROMETHAMINE 30 MG/ML IJ SOLN
30.0000 mg | Freq: Four times a day (QID) | INTRAMUSCULAR | Status: DC | PRN
  Administered 2020-04-08: 30 mg via INTRAVENOUS

## 2020-04-08 MED ORDER — LIDOCAINE 2% (20 MG/ML) 5 ML SYRINGE
INTRAMUSCULAR | Status: AC
  Filled 2020-04-08: qty 5

## 2020-04-08 MED ORDER — SODIUM CHLORIDE 0.9 % IR SOLN
Status: DC | PRN
  Administered 2020-04-08: 3000 mL

## 2020-04-08 MED ORDER — LIDOCAINE HCL (CARDIAC) PF 100 MG/5ML IV SOSY
PREFILLED_SYRINGE | INTRAVENOUS | Status: DC | PRN
  Administered 2020-04-08: 50 mg via INTRAVENOUS

## 2020-04-08 MED ORDER — ALUM & MAG HYDROXIDE-SIMETH 200-200-20 MG/5ML PO SUSP: 30.0000 mL | ORAL | Status: DC | PRN

## 2020-04-08 MED ORDER — LIDOCAINE-EPINEPHRINE 1 %-1:100000 IJ SOLN
INTRAMUSCULAR | Status: DC | PRN
  Administered 2020-04-08: 10 mL

## 2020-04-08 SURGICAL SUPPLY — 85 items
ADH SKN CLS APL DERMABOND .7 (GAUZE/BANDAGES/DRESSINGS) ×3
AGENT HMST KT MTR STRL THRMB (HEMOSTASIS) ×3
APL SRG 38 LTWT LNG FL B (MISCELLANEOUS) ×3
APPLICATOR ARISTA FLEXITIP XL (MISCELLANEOUS) ×1 IMPLANT
BLADE SURG 10 STRL SS (BLADE) IMPLANT
BLADE SURG 11 STRL SS (BLADE) ×5 IMPLANT
BLADE SURG 15 STRL LF DISP TIS (BLADE) ×4 IMPLANT
BLADE SURG 15 STRL SS (BLADE) ×4
CABLE HIGH FREQUENCY MONO STRZ (ELECTRODE) IMPLANT
CANISTER SUCT 3000ML PPV (MISCELLANEOUS) ×5 IMPLANT
CATH FOLEY 2WAY SLVR  5CC 18FR (CATHETERS) ×4
CATH FOLEY 2WAY SLVR 5CC 18FR (CATHETERS) ×4 IMPLANT
CELL SAVER LIPIGURD (MISCELLANEOUS) IMPLANT
COVER BACK TABLE 60X90IN (DRAPES) ×5 IMPLANT
COVER MAYO STAND STRL (DRAPES) ×5 IMPLANT
COVER SURGICAL LIGHT HANDLE (MISCELLANEOUS) ×1 IMPLANT
COVER WAND RF STERILE (DRAPES) ×10 IMPLANT
DECANTER SPIKE VIAL GLASS SM (MISCELLANEOUS) IMPLANT
DERMABOND ADVANCED (GAUZE/BANDAGES/DRESSINGS) ×1
DERMABOND ADVANCED .7 DNX12 (GAUZE/BANDAGES/DRESSINGS) ×8 IMPLANT
DEVICE RETRIEVAL ALEXIS 14 (MISCELLANEOUS) IMPLANT
DILATOR CANAL MILEX (MISCELLANEOUS) IMPLANT
DRSG COVADERM PLUS 2X2 (GAUZE/BANDAGES/DRESSINGS) IMPLANT
DURAPREP 26ML APPLICATOR (WOUND CARE) ×5 IMPLANT
EXTRT SYSTEM ALEXIS 14CM (MISCELLANEOUS)
EXTRT SYSTEM ALEXIS 17CM (MISCELLANEOUS)
GAUZE 4X4 16PLY RFD (DISPOSABLE) ×10 IMPLANT
GAUZE PACKING 2X5 YD STRL (GAUZE/BANDAGES/DRESSINGS) IMPLANT
GLOVE BIO SURGEON STRL SZ 6.5 (GLOVE) ×5 IMPLANT
GLOVE BIOGEL PI IND STRL 7.0 (GLOVE) ×12 IMPLANT
GLOVE BIOGEL PI INDICATOR 7.0 (GLOVE) ×3
GLOVE ECLIPSE 6.5 STRL STRAW (GLOVE) ×10 IMPLANT
GOWN STRL REUS W/TWL LRG LVL3 (GOWN DISPOSABLE) ×20 IMPLANT
GOWN STRL REUS W/TWL XL LVL3 (GOWN DISPOSABLE) ×10 IMPLANT
HEMOSTAT ARISTA ABSORB 3G PWDR (HEMOSTASIS) ×1 IMPLANT
LEGGING LITHOTOMY PAIR STRL (DRAPES) ×5 IMPLANT
LIGASURE VESSEL 5MM BLUNT TIP (ELECTROSURGICAL) ×5 IMPLANT
NEEDLE HYPO 22GX1.5 SAFETY (NEEDLE) ×5 IMPLANT
NEEDLE INSUFFLATION 120MM (ENDOMECHANICALS) ×5 IMPLANT
NS IRRIG 1000ML POUR BTL (IV SOLUTION) ×10 IMPLANT
OCCLUDER COLPOPNEUMO (BALLOONS) ×5 IMPLANT
PACK LAPAROSCOPY BASIN (CUSTOM PROCEDURE TRAY) ×5 IMPLANT
PACK TRENDGUARD 450 HYBRID PRO (MISCELLANEOUS) ×4 IMPLANT
PACK VAGINAL WOMENS (CUSTOM PROCEDURE TRAY) ×5 IMPLANT
PACKING VAGINAL (PACKING) IMPLANT
PENCIL BUTTON HOLSTER BLD 10FT (ELECTRODE) IMPLANT
POUCH LAPAROSCOPIC INSTRUMENT (MISCELLANEOUS) ×5 IMPLANT
PROTECTOR NERVE ULNAR (MISCELLANEOUS) ×10 IMPLANT
SCISSORS LAP 5X35 DISP (ENDOMECHANICALS) IMPLANT
SET IRRIG Y TYPE TUR BLADDER L (SET/KITS/TRAYS/PACK) ×10 IMPLANT
SET SUCTION IRRIG HYDROSURG (IRRIGATION / IRRIGATOR) ×5 IMPLANT
SET TRI-LUMEN FLTR TB AIRSEAL (TUBING) ×5 IMPLANT
SHEARS HARMONIC ACE PLUS 36CM (ENDOMECHANICALS) ×5 IMPLANT
SHEET LAVH (DRAPES) ×5 IMPLANT
SLING TVT EXACT (Sling) ×1 IMPLANT
SURGIFLO W/THROMBIN 8M KIT (HEMOSTASIS) ×1 IMPLANT
SUT VIC AB 0 CT1 27 (SUTURE) ×8
SUT VIC AB 0 CT1 27XBRD ANBCTR (SUTURE) ×8 IMPLANT
SUT VIC AB 2-0 CT1 27 (SUTURE)
SUT VIC AB 2-0 CT1 TAPERPNT 27 (SUTURE) IMPLANT
SUT VIC AB 2-0 CT2 27 (SUTURE) IMPLANT
SUT VIC AB 2-0 SH 27 (SUTURE) ×12
SUT VIC AB 2-0 SH 27XBRD (SUTURE) ×4 IMPLANT
SUT VICRYL 0 UR6 27IN ABS (SUTURE) IMPLANT
SUT VICRYL 4-0 PS2 18IN ABS (SUTURE) ×5 IMPLANT
SUT VLOC 180 0 9IN  GS21 (SUTURE) ×4
SUT VLOC 180 0 9IN GS21 (SUTURE) ×4 IMPLANT
SYR 10ML LL (SYRINGE) ×5 IMPLANT
SYR 50ML LL SCALE MARK (SYRINGE) ×10 IMPLANT
SYR BULB IRRIGATION 50ML (SYRINGE) ×5 IMPLANT
SYSTEM CARTER THOMASON II (TROCAR) IMPLANT
SYSTEM CONTND EXTRCTN KII BLLN (MISCELLANEOUS) IMPLANT
TIP UTERINE 5.1X6CM LAV DISP (MISCELLANEOUS) IMPLANT
TIP UTERINE 6.7X10CM GRN DISP (MISCELLANEOUS) ×1 IMPLANT
TIP UTERINE 6.7X6CM WHT DISP (MISCELLANEOUS) IMPLANT
TIP UTERINE 6.7X8CM BLUE DISP (MISCELLANEOUS) IMPLANT
TOWEL OR 17X26 10 PK STRL BLUE (TOWEL DISPOSABLE) ×18 IMPLANT
TRAY FOLEY W/BAG SLVR 14FR (SET/KITS/TRAYS/PACK) ×10 IMPLANT
TRENDGUARD 450 HYBRID PRO PACK (MISCELLANEOUS) ×4
TROCAR ADV FIXATION 5X100MM (TROCAR) ×5 IMPLANT
TROCAR BLADELESS OPT 5 100 (ENDOMECHANICALS) ×5 IMPLANT
TROCAR PORT AIRSEAL 5X120 (TROCAR) ×5 IMPLANT
TROCAR XCEL NON BLADE 8MM B8LT (ENDOMECHANICALS) ×5 IMPLANT
TUBE CONNECTING 12X1/4 (SUCTIONS) ×5 IMPLANT
WARMER LAPAROSCOPE (MISCELLANEOUS) ×5 IMPLANT

## 2020-04-08 NOTE — Op Note (Signed)
OPERATIVE REPORT  PREOPERATIVE DIAGNOSES:   Genuine stress incontinence, rectocele.  POSTOPERATIVE DIAGNOSES:   Genuine stress incontinence, rectocele.  PROCEDURES:  TVT Exact midurethral sling, cystoscopy, posterior colporrhaphy with native tissue repair.  SURGEON:  Lenard Galloway, M.D.  ASSISTANT:    Megan Salon, M.D.  ANESTHESIA:  General endotracheal, local with 1% lidocaine with epinephrine, 1:100,000.  EBL:  150 cc  URINE OUTPUT:  150 cc.   IV FLUIDS:   1500 cc total for entire surgery  COMPLICATIONS:  None.  INDICATIONS FOR THE PROCEDURE:  The patient is a 44 year old Gravida 2, 28 2 Caucasian female who presents with urinary incontinence with a cough and laugh, a long standing rectocele, and abnormal uterine bleeding.  On physical exam, the patient was noted to have a first degree rectocele.  The patient underwent multichannel urodynamic testing, and she was diagnosed with genuine stress incontinence.  The patient is wishing for surgical repair and a plan is made to proceed now with aTVT Exact midurethral sling, cystoscopy, and a posterior colporrhaphy.  Risks, benefits, and alternatives are reviewed with the patient, who wishes to proceed.   Her surgery is performed concurrently with her total laparoscopic hysterectomy and bilateral salpingectomy performed by Dr. Edwinna Areola.   FINDINGS:  Examination under anesthesia revealed   The bladder was visualized throughout 360 degrees and was normal.  There  was no foreign body in the bladder or the urethra.  The ureters were noted to  be patent bilaterally.  SPECIMENS:  None.  DESCRIPTION OF PROCEDURE:  The patient was reidentified in the preoperative hold area.  She received Cefotetan IV for prophylaxis.  She received Lovenox and PAS stockings for DVT prophylaxis.  The patient was transferred to the operating room where she was placed in the dorsal lithotomy position with Allen stirrups.  General endotracheal  anesthesia  was induced.  The patient's abdomen, vagina and perineum were then sterilely  prepped and she was draped.  A Foley catheter was sterilely placed inside the bladder  and left to gravity drainage throughout the procedure.  An examination under anesthesia was performed.  [name] performed the laparoscopic hysterectomy and bilateral salpingectomy.  Please refer to this dictation separately.  [name] closed the he vaginal cuff and laparoscopic incisions.    I then performed cystoscopy. The bladder was normal throughout 360 degrees  including the bladder dome and trigone.  There was no evidence of a foreign body in the bladder or urethra. The ureters were patent bilaterally. The bladder was drained of cystoscopic fluid, and the foley catheter was replaced.  Allis clamps were used to mark the anterior vaginal wall over a two cm distance just  below the urethra.  The anterior vaginal wall mucosa was injected locally with 1%  lidocaine with epinephrine, 1:100,000.  The vaginal mucosa was then incised vertically  in the midline with the scalpel.  With a combination of sharp and blunt dissection, the subvaginal tissue was dissected off the bladder bilaterally.  The dissection was carried  back to the pubic rami anteriorly.   The TVT Exact midurethral sling was performed.  The 1 cm suprapubic incisions were created with a scalpel to the right and left of the midline.  The TVT Exact was performed in a bottom-up fashion.  The Foley catheter was removed and the Foley tip with the obturator guide was placed inside the urethra and deflected properly.  The guide was placed through the right retropubic space and then up  through the right suprapubic incision.  This was performed without difficulty.  The urethra was deflected in opposite direction and the same was then performed on the patient's left-hand side.  The obturator guide was removed and cystoscopy was performed and the  findings were as noted above.  All cystoscopic fluid was drained and the Foley catheter was replaced. The sling was brought up through the suprapubic incisions bilaterally. A Kelly clamp was placed between the sling and the urethra, and the plastic sheaths were removed.  The sling was trimmed suprapubically. The sling was noted to be in good position.  Surgiflow was placed over vaginal dissection for the TVT procedure due to  Bleeding at the exit site of the sling on the patient's left side.  Hemostasis  was then good.  The anterior vaginal wall mucosa was trimmed and then the anterior vaginal wall was closed with a running locked suture of 2-0 Vicryl.  The posterior colporrhaphy was performed next.  Allis clamps were used to mark the perineal body and then the posterior vaginal mucosa up to the level just below the vaginal apex.  The perineal body and mucosa were injected locally with 1% lidocaine with epinephrine, 1:100,000.  A triangular wedge of epithelium was excised from the perineal body and the posterior mucosa was incised vertically in the midline with a Metzenbaum scissors.  Sharp and blunt dissection were used to dissect the perirectal fascia off the vaginal mucosa bilaterally.  There was an extensive network of veins overlying the rectum.  Bleeding during the dissection was controlled with monopolar cautery and a figure of eight suture of 2/0 Vicryl.  Vertical mattress sutures of 2/0 Vicryl were used to reduce the rectocele.  Excess vaginal mucosa was trimmed and the posterior vaginal wall was closed  with a running lock suture of 2-0 Vicryl down to the hymen.  This suture  was then  brought behind the hymen. The 2-0 Vicryl was used to place running sutures along  the transverse superficial perineal muscles.  This suture was then brought up the  perineum in a subcuticular fashion and the knot was tied at the hymen as for an  episiotomy-type repair.  Rectal exam was  performed and there was no evidence of any suture or foreign body in the rectum.    A gauze packing with Estrace was placed in the vagina.  The suprapubic incisions were closed with Dermabond at the end of the procedure.  The patient was awakened and extubated, and escorted to the recovery room in  stable condition.  There were no complications.  All needle, instrument, and sponge  counts were correct.   Lenard Galloway, M.D.    Marland Kitchen

## 2020-04-08 NOTE — Transfer of Care (Signed)
Immediate Anesthesia Transfer of Care Note  Patient: Autumn [name]  Procedure(s) Performed: TOTAL LAPAROSCOPIC HYSTERECTOMY WITH BILATERAL SALPINGECTOMY (N/A Abdomen) POSTERIOR REPAIR (RECTOCELE) WITH NATIVE TISSUE REPAIR (N/A Vagina ) POSSIBLE TRANSVAGINAL TAPE (TVT) PROCEDURE MIDURETHAL SLING (N/A Vagina ) CYSTOSCOPY (N/A Bladder)  Patient Location: PACU  Anesthesia Type:General  Level of Consciousness: awake, alert , oriented and patient cooperative  Airway & Oxygen Therapy: Patient Spontanous Breathing and Patient connected to face mask oxygen  Post-op Assessment: Report given to RN and Post -op Vital signs reviewed and stable  Post vital signs: Reviewed and stable  Last Vitals:  Vitals Value Taken Time  BP 116/70 04/08/20 1210  Temp    Pulse 90 04/08/20 1212  Resp 11 04/08/20 1212  SpO2 100 % 04/08/20 1212  Vitals shown include unvalidated device data.  Last Pain:  Vitals:   04/08/20 0638  TempSrc: Oral  PainSc: 0-No pain      Patients Stated Pain Goal: 6 ([id number])  Complications: No apparent anesthesia complications

## 2020-04-08 NOTE — Anesthesia Preprocedure Evaluation (Addendum)
Anesthesia Evaluation  Patient identified by MRN, date of birth, ID band Patient awake    Reviewed: Allergy & Precautions, NPO status , Patient's Chart, lab work & pertinent test results  Airway Mallampati: II  TM Distance: >3 FB Neck ROM: Full    Dental  (+) Teeth Intact, Dental Advisory Given   Pulmonary neg pulmonary ROS,    Pulmonary exam normal breath sounds clear to auscultation       Cardiovascular negative cardio ROS Normal cardiovascular exam Rhythm:Regular Rate:Normal     Neuro/Psych  Headaches, PSYCHIATRIC DISORDERS Anxiety Depression    GI/Hepatic negative GI ROS, Neg liver ROS,   Endo/Other  negative endocrine ROS  Renal/GU negative Renal ROS  negative genitourinary   Musculoskeletal  (+) Arthritis ,   Abdominal   Peds  (+) ADHD Hematology  (+) Blood dyscrasia (F5 Leiden heterozygous), ,   Anesthesia Other Findings   Reproductive/Obstetrics                           Anesthesia Physical Anesthesia Plan  ASA: II  Anesthesia Plan: General   Post-op Pain Management:    Induction: Intravenous  PONV Risk Score and Plan: 3 and Midazolam, Dexamethasone and Ondansetron  Airway Management Planned: Oral ETT  Additional Equipment:   Intra-op Plan:   Post-operative Plan: Extubation in OR  Informed Consent: I have reviewed the patients History and Physical, chart, labs and discussed the procedure including the risks, benefits and alternatives for the proposed anesthesia with the patient or authorized representative who has indicated his/her understanding and acceptance.     Dental advisory given  Plan Discussed with: CRNA  Anesthesia Plan Comments:        Anesthesia Quick Evaluation

## 2020-04-08 NOTE — Op Note (Signed)
04/08/2020  12:03 PM  PATIENT:  Autumn [name]  44 y.o. female  PRE-OPERATIVE DIAGNOSIS:  Irregular uterine bleeding, History of menorrhagia, Adenomyosis, Incomplete uterovaginal prolapse, Mixed incontinence  POST-OPERATIVE DIAGNOSIS:  Irregular uterine bleeding, History of menorrhagia, Adenomyosis, Incomplete uterovaginal prolapse, Mixed incontinence  PROCEDURE:  Procedure(s): TOTAL LAPAROSCOPIC HYSTERECTOMY WITH BILATERAL SALPINGECTOMY POSTERIOR REPAIR (RECTOCELE) WITH NATIVE TISSUE REPAIR POSSIBLE TRANSVAGINAL TAPE (TVT) PROCEDURE MIDURETHAL SLING CYSTOSCOPY  SURGEON:  Megan Salon  ASSISTANTS: Dr. Josefa Half   ANESTHESIA:   general  ESTIMATED BLOOD LOSS: 200 mL (40cc for TLH portion of procedure)  BLOOD ADMINISTERED:none   FLUIDS: 1500cc LR  UOP: 450cc clear uop  SPECIMEN:  Uterus, cervix, bilateral fallopian tubes  DISPOSITION OF SPECIMEN:  PATHOLOGY  FINDINGS: bulky uterus, normal ovaries with small functional appearing cysts on both ovaries, no evidence of endometriosis   DESCRIPTION OF OPERATION: Patient is taken to the operating room. She is placed in the supine position. She is a running IV in place. Informed consent was present on the chart. SCDs on her lower extremities and functioning properly. Patient was positioned while she was awake.  Her legs were placed in the low lithotomy position in [place]. Her arms were tucked by the side.  General endotracheal anesthesia was administered by the anesthesia staff without difficulty. Dr. Lanetta Inch, anesthesia, oversaw case.  Time out performed.    Chlora prep was then used to prep the abdomen and Hibiclens was used to prep the inner thighs, perineum and vagina. Once 3 minutes had past the patient was draped in a normal standard fashion. The legs were lifted to the high lithotomy position. The cervix was visualized by placing a heavy weighted speculum in the posterior aspect of the vagina and using a curved  Deaver retractor to the retract anteriorly. The anterior lip of the cervix was grasped with single-tooth tenaculum.  The cervix sounded to 10 cm. Pratt dilators were used to dilate the cervix up to a #21. A RUMI uterine manipulator was obtained. A #10disposable tip was placed on the RUMI manipulator as well as a 3.5, silver KOH ring. This was passed through the cervix and the bulb of the disposable tip was inflated with 10 cc of normal saline. There was a good fit of the KOH ring around the cervix. The tenaculum was removed. There is also good manipulation of the uterus. The speculum and retractor were removed as well. A Foley catheter was placed to straight drain.  Clear urine was noted. Legs were lowered to the low lithotomy position and attention was turned the abdomen.  The umbilicus was everted.  A Veress needle was obtained. Syringe of sterile saline was placed on a open Veress needle.  This was passed into the umbilicus until just when the fluid started to drip.  Then low flow CO2 gas was attached the needle and the pneumoperitoneum was achieved without difficulty. Once four liters of gas was in the abdomen the Veress needle was removed and a 5 millimeter non-bladed Optiview trocar and port were passed directly to the abdomen. The laparoscope was then used to confirm intraperitoneal placement. Findings were noted above .  Locations for RLQ, LLQ, and suprapubic ports were noted by transillumination of the abdominal wall.  0.25% marcaine was used to anesthetize the skin.  80mm skin incision was made in the RLQ and an AirSeal port was placed underdirect visualization of the laparoscope.  Then a 59mm skin incision was made and a 101mm nonbladed trochar and port was  placed in the LLQ.  Finally, and 25mm skin incision was made about 4cm above the pubic symphasis and an 23mm non-bladed port was placed with direct visualization of the laparoscope.  All trochars were removed.    Ureters were identifies.  Attention was  turned to the left side. The filshie clip from this side was in the cul de sac and was removed.  With uterus on stretch the left tube was excised off the ovary and mesosalpinx was dissected to free the tube. Then the left utero-ovarian pedicle was serially clamped cauterized and incised using the ligasure device. Left round ligament was serially clamped cauterized and incised. The anterior and posterior peritoneum of the inferior leaf of the broad ligament were opened. The beginning of the bladder flap was created.  The bladder was taken down below the level of the KOH ring. There were adhesions present from the two prior cesarean sections but this was gradually incised with sharp and blunt dissection.  The left uterine artery skeletonized and then just superior to the KOH ring this vessel was serially clamped, cauterized, and incised.  Attention was turned the right side.  The uterus was placed on stretch to the opposite side.  The tube was excised off the ovary, with filshie clip present, using sharp dissection a bipolar cautery.  The mesosalpinx was incised freeing the tube. Then the right uterine ovarian pedicle was serially clamped cauterized and incised. Next the right round ligament was serially clamped cauterized and incised. The anterior posterior peritoneum of the inferiorly for the broad ligament were opened. The anterior peritoneum was carried across to the dissection on the left side. The remainder of the bladder flap was created using sharp dissection. The bladder was well below the level of the KOH ring. The left uterine artery skeletonized. Then the left uterine artery, above the level of the KOH ring, was serially clamped cauterized and incised. The uterus was devascularized at this point.  The colpotomy was performed a starting in the midline and using a harmonic scalpel with the inferior edge of the open blade  This was carried around a circumferential fashion until the vaginal mucosa was  completely incised in the specimen was freed.  The specimen was then delivered to the vagina.  A vaginal occlusive device was used to maintain the pneumoperitoneum  Instruments were changed with a needle driver and Kobra graspers.  Using a 9 inch V. lock suture, the cuff was closed by incorporating the anterior and posterior vaginal mucosa in each stitch. This was carried across all the way to the left corner and a running fashion. Two stitches were brought back towards the midline and the suture was cut flush with the vagina. The needle was brought out the pelvis. The pelvis was irrigated. All pedicles were inspected. No bleeding was noted. In Interceed was placed across vaginal cuff. Ureters were noted deep in the pelvis to be peristalsing.  At this point the initial portion of the procedure was completed.  The remaining instruments were removed.  The LLQ port was removed under direct visualization of the laparoscope and the pneumoperitoneum was relieved.  The patient was taken out of Trendelenburg positioning.  Several deep breaths were given to the patient's trying to any gas the abdomen and finally the three remaining ports were removed.  The skin was then closed with subcuticular stitches of 3-0 Vicryl. The skin was cleansed Dermabond was applied. Attention was then turned the vagina and the cuff was inspected. No bleeding was  noted. The anterior posterior vaginal mucosa was incorporated in each stitch.   Dr Quincy Simmonds, at this point, was ready to take over as lead surgeon.  She did perform a cystoscopy prior to beginning her portion of the procedure.  The Foley catheter was  removed.  Cystoscopy was performed.  No sutures or bladder injuries were noted.  Ureters were noted with normal urine jets from each one was seen.  Foley was left out after the cystoscopic fluid was drained and cystoscope removed.  Foley was replaced and her portion of the procedure was completed. This is documented in separate operative  note.  Sponge, lap, needle, initially counts were correct x2.  COUNTS:  YES  PLAN OF CARE: Transfer to PACU

## 2020-04-08 NOTE — H&P (Signed)
Autumn [name] is an 44 y.o. female G2P2 MWF here for surgical correction of incomplete uterine prolapse and rectocele and SUI. Pt has tried a Mirena IUD without success.  She's also seen Dr. Quincy Simmonds in consultation and is having a TVT and rectocele repair as well.  Risks, benefits and alternatives have been reviewed.  Pt did undergo presurgical urodynamics.  She had new abdominal pain last week and CT was performed showing an ovarian cyst.    Pertinent Gynecological History: Menses: regular and heavy Bleeding: menorrhagia Contraception: tubal ligation DES exposure: denies Blood transfusions: none Sexually transmitted diseases: no past history Previous GYN Procedures: IUD placement and removal  Last mammogram: normal Date: 02/19/2020 Last pap: normal Date: 06/05/2020 OB History: G2, P2   Menstrual History: Patient's last menstrual period was 03/20/2020 (exact date).    Past Medical History:  Diagnosis Date  . Anemia   . Anxiety   . Attention deficit hyperactivity disorder (ADHD) 02/16/2017   Sees [facility] for managment  . Calculus of kidney 06/07/2012   Overview:  Dr Karsten Ro - urology   . Cancer ([place])    melanoma removed from abdomen in 2000  . Complication of anesthesia    low BP during C-section  . Depression   . Factor V Leiden carrier ([place])    heterozygous  . Hernia, rectovaginal 06/07/2012   Overview:  Dr Corinna Capra - gyn   . History of abnormal cervical Pap smear 1997   dysplasia- LEEP  . Migraine   . Ovarian cyst 1994  . Psoriasis 02/16/2017   -sees dermatologist  . Rectocele     Past Surgical History:  Procedure Laterality Date  . CERVICAL BIOPSY  W/ LOOP ELECTRODE EXCISION    . CESAREAN SECTION  2007 & 2010  . KNEE ARTHROPLASTY Right 1989  . OVARIAN CYST SURGERY  1994  . SINUS ENDO WITH FUSION    . TUBAL LIGATION  2010  . VULVA /PERINEUM BIOPSY  01/2015    Family History  Problem Relation Age of Onset  . Esophageal cancer Father   .  Esophageal cancer Maternal Uncle   . Rectal cancer Maternal Uncle   . Diabetes Maternal Grandmother   . Heart disease Maternal Grandmother     Social History:  reports that she has never smoked. She has never used smokeless tobacco. She reports current alcohol use. She reports that she does not use drugs.  Allergies: No Known Allergies  Medications Prior to Admission  Medication Sig Dispense Refill Last Dose  . ferrous sulfate 325 (65 FE) MG tablet Take 325 mg by mouth daily with breakfast.   Past Week at Unknown time  . meloxicam (MOBIC) 15 MG tablet Take 15 mg by mouth daily as needed for pain.   Past Month at Unknown time  . Multiple Vitamin (MULTIVITAMIN WITH MINERALS) TABS tablet Take 1 tablet by mouth daily.   Past Week at Unknown time  . olopatadine (PATADAY) 0.1 % ophthalmic solution Place 1 drop into both eyes daily.   04/07/2020 at Unknown time  . Secukinumab (COSENTYX SENSOREADY PEN) 150 MG/ML SOAJ Inject 300 mg into the skin every 30 (thirty) days.    Past Month at Unknown time  . tretinoin (RETIN-A) 0.05 % cream Apply 1 application topically daily as needed (acne).    Past Week at Unknown time  . Ubrogepant (UBRELVY) 100 MG TABS Take 100 mg by mouth daily as needed (migraine).    Past Month at Unknown time  . venlafaxine XR (  EFFEXOR-XR) 37.5 MG 24 hr capsule TAKE 1 CAPSULE DAILY WITH BREAKFAST. (Patient taking differently: Take 37.5 mg by mouth daily with breakfast. ) 90 capsule 1 04/07/2020 at Unknown time    Review of Systems  All other systems reviewed and are negative.   Blood pressure 129/74, pulse 79, temperature 97.7 F (36.5 C), temperature source Oral, resp. rate 18, height 5' 1.25" (1.556 m), weight 85.1 kg, last menstrual period 03/20/2020, SpO2 100 %. Physical Exam  Constitutional: She is oriented to person, place, and time. She appears well-developed and well-nourished.  Cardiovascular: Normal rate and regular rhythm.  Respiratory: Effort normal and breath sounds  normal.  Neurological: She is alert and oriented to person, place, and time.  Skin: Skin is warm and dry.  Psychiatric: She has a normal mood and affect.    Results for orders placed or performed during the hospital encounter of 04/08/20 (from the past 24 hour(s))  Pregnancy, urine POC     Status: None   Collection Time: 04/08/20  5:51 AM  Result Value Ref Range   Preg Test, Ur NEGATIVE NEGATIVE    No results found.  Assessment/Plan: 44 yo G2P2 MWF with incomplete uterine prolapse, rectocele and SUI here for surgical correction including TLH/Bilateral salpingectomy/TVT with cystoscopy and rectocele repair.  Pt ready to proceed.  Megan Salon 04/08/2020, 7:26 AM

## 2020-04-08 NOTE — Progress Notes (Signed)
Day of Surgery Procedure(s) (LRB): TOTAL LAPAROSCOPIC HYSTERECTOMY WITH BILATERAL SALPINGECTOMY (N/A) POSTERIOR REPAIR (RECTOCELE) WITH NATIVE TISSUE REPAIR (N/A) POSSIBLE TRANSVAGINAL TAPE (TVT) PROCEDURE MIDURETHAL SLING (N/A) CYSTOSCOPY (N/A)  This is a late entry note.  Pt was seen around 6pm earlier tonight.  Subjective: Patient reports having some nausea.  Pain is under good control.  Spouse with pt.  Questions about surgery answered.  Catheter still in place.  Has walked the halls x 2.     Objective: I have reviewed patient's vital signs, intake and output, medications and labs.  Vitals:   04/08/20 1816 04/08/20 2214  BP: 114/78 (!) 105/59  Pulse: 80 82  Resp: 18 16  Temp: 98.5 F (36.9 C) 98.7 F (37.1 C)  SpO2: 99% 99%   Hb at 1325:  11.9  General: alert, cooperative and no distress Resp: clear to auscultation bilaterally Cardio: regular rate and rhythm, S1, S2 normal, no murmur, click, rub or gallop GI: soft, non-distended, occasional BS Extremities: extremities normal, atraumatic, no cyanosis or edema Vaginal Bleeding: minimal Inc: C/D/I  Assessment: s/p Procedure(s): TOTAL LAPAROSCOPIC HYSTERECTOMY WITH BILATERAL SALPINGECTOMY (N/A) POSTERIOR REPAIR (RECTOCELE) WITH NATIVE TISSUE REPAIR (N/A) POSSIBLE TRANSVAGINAL TAPE (TVT) PROCEDURE MIDURETHAL SLING (N/A) CYSTOSCOPY (N/A): stable  Plan: Advance diet as tolerated Continue IVF Foley to SD CBC in AM Adding scheduled Toradol Zofran and phenergan as needed for nausea   LOS: 0 days    Megan Salon 04/08/2020, 10:30 PM

## 2020-04-08 NOTE — Anesthesia Procedure Notes (Signed)
Procedure Name: Intubation Date/Time: 04/08/2020 7:53 AM Performed by: Garrel Ridgel, CRNA Pre-anesthesia Checklist: Patient identified, Emergency Drugs available, Suction available and Patient being monitored Patient Re-evaluated:Patient Re-evaluated prior to induction Oxygen Delivery Method: Circle system utilized Preoxygenation: Pre-oxygenation with 100% oxygen Induction Type: IV induction Ventilation: Mask ventilation without difficulty Laryngoscope Size: Mac and 3 Grade View: Grade I Tube type: Oral Tube size: 7.0 mm Number of attempts: 1 Airway Equipment and Method: Stylet and Oral airway Placement Confirmation: ETT inserted through vocal cords under direct vision,  positive ETCO2 and breath sounds checked- equal and bilateral Secured at: 22 cm Tube secured with: Tape Dental Injury: Teeth and Oropharynx as per pre-operative assessment

## 2020-04-08 NOTE — Telephone Encounter (Signed)
Patient had COVID test on 04/05/2020 this was okay per [facility]. Surgery was today. Encounter closed.

## 2020-04-08 NOTE — Progress Notes (Signed)
Update to History and Physical  Had a CT scan on 04/04/20 for right lower quadrant periumbilical pain.  She had a 3.6 x 1.9 cm follicle of the right ovary, normal appendix, no obstructing calculus of the right mid kidney, and tiny umbilical hernia.   Patient examined.  OK to proceed with surgery.

## 2020-04-09 DIAGNOSIS — F329 Major depressive disorder, single episode, unspecified: Secondary | ICD-10-CM | POA: Diagnosis not present

## 2020-04-09 DIAGNOSIS — N800 Endometriosis of uterus: Secondary | ICD-10-CM | POA: Diagnosis not present

## 2020-04-09 DIAGNOSIS — Z9851 Tubal ligation status: Secondary | ICD-10-CM | POA: Diagnosis not present

## 2020-04-09 DIAGNOSIS — F419 Anxiety disorder, unspecified: Secondary | ICD-10-CM | POA: Diagnosis not present

## 2020-04-09 DIAGNOSIS — R110 Nausea: Secondary | ICD-10-CM | POA: Diagnosis not present

## 2020-04-09 DIAGNOSIS — L409 Psoriasis, unspecified: Secondary | ICD-10-CM | POA: Diagnosis not present

## 2020-04-09 DIAGNOSIS — N83202 Unspecified ovarian cyst, left side: Secondary | ICD-10-CM | POA: Diagnosis not present

## 2020-04-09 DIAGNOSIS — N83201 Unspecified ovarian cyst, right side: Secondary | ICD-10-CM | POA: Diagnosis not present

## 2020-04-09 DIAGNOSIS — N812 Incomplete uterovaginal prolapse: Secondary | ICD-10-CM | POA: Diagnosis not present

## 2020-04-09 DIAGNOSIS — D649 Anemia, unspecified: Secondary | ICD-10-CM | POA: Diagnosis not present

## 2020-04-09 DIAGNOSIS — N3946 Mixed incontinence: Secondary | ICD-10-CM | POA: Diagnosis not present

## 2020-04-09 DIAGNOSIS — N921 Excessive and frequent menstruation with irregular cycle: Secondary | ICD-10-CM | POA: Diagnosis not present

## 2020-04-09 DIAGNOSIS — G43909 Migraine, unspecified, not intractable, without status migrainosus: Secondary | ICD-10-CM | POA: Diagnosis not present

## 2020-04-09 DIAGNOSIS — D6851 Activated protein C resistance: Secondary | ICD-10-CM | POA: Diagnosis not present

## 2020-04-09 DIAGNOSIS — N816 Rectocele: Secondary | ICD-10-CM | POA: Diagnosis not present

## 2020-04-09 DIAGNOSIS — N72 Inflammatory disease of cervix uteri: Secondary | ICD-10-CM | POA: Diagnosis not present

## 2020-04-09 LAB — CBC
HCT: 32.9 % — ABNORMAL LOW (ref 36.0–46.0)
Hemoglobin: 10.7 g/dL — ABNORMAL LOW (ref 12.0–15.0)
MCH: 28.5 pg (ref 26.0–34.0)
MCHC: 32.5 g/dL (ref 30.0–36.0)
MCV: 87.5 fL (ref 80.0–100.0)
Platelets: 143 10*3/uL — ABNORMAL LOW (ref 150–400)
RBC: 3.76 MIL/uL — ABNORMAL LOW (ref 3.87–5.11)
RDW: 16.6 % — ABNORMAL HIGH (ref 11.5–15.5)
WBC: 11 10*3/uL — ABNORMAL HIGH (ref 4.0–10.5)
nRBC: 0 % (ref 0.0–0.2)

## 2020-04-09 LAB — SURGICAL PATHOLOGY

## 2020-04-09 MED ORDER — IBUPROFEN 800 MG PO TABS
800.0000 mg | ORAL_TABLET | Freq: Three times a day (TID) | ORAL | 0 refills | Status: DC | PRN
Start: 2020-04-09 — End: 2020-05-08

## 2020-04-09 MED ORDER — HYDROMORPHONE HCL 2 MG PO TABS: 1.0000 mg | ORAL_TABLET | ORAL | 0 refills | Status: DC | PRN

## 2020-04-09 MED ORDER — HYDROMORPHONE HCL 2 MG PO TABS
ORAL_TABLET | ORAL | Status: AC
  Filled 2020-04-09: qty 1

## 2020-04-09 MED ORDER — SIMETHICONE 80 MG PO CHEW
CHEWABLE_TABLET | ORAL | Status: AC
  Filled 2020-04-09: qty 1

## 2020-04-09 MED ORDER — KETOROLAC TROMETHAMINE 30 MG/ML IJ SOLN
INTRAMUSCULAR | Status: AC
  Filled 2020-04-09: qty 1

## 2020-04-09 NOTE — Anesthesia Postprocedure Evaluation (Signed)
Anesthesia Post Note  Patient: Autumn [name]  Procedure(s) Performed: TOTAL LAPAROSCOPIC HYSTERECTOMY WITH BILATERAL SALPINGECTOMY (N/A Abdomen) POSTERIOR REPAIR (RECTOCELE) WITH NATIVE TISSUE REPAIR (N/A Vagina ) POSSIBLE TRANSVAGINAL TAPE (TVT) PROCEDURE MIDURETHAL SLING (N/A Vagina ) CYSTOSCOPY (N/A Bladder)     Patient location during evaluation: PACU Anesthesia Type: General Level of consciousness: awake and alert Pain management: pain level controlled Vital Signs Assessment: post-procedure vital signs reviewed and stable Respiratory status: spontaneous breathing, nonlabored ventilation, respiratory function stable and patient connected to nasal cannula oxygen Cardiovascular status: blood pressure returned to baseline and stable Postop Assessment: no apparent nausea or vomiting Anesthetic complications: no    Last Vitals:  Vitals:   04/09/20 0159 04/09/20 0530  BP: (!) 100/57 (!) 98/58  Pulse: 86 72  Resp: 20 18  Temp: 37.3 C   SpO2: 99% 100%    Last Pain:  Vitals:   04/09/20 0530  TempSrc:   PainSc: 4    Pain Goal: Patients Stated Pain Goal: 6 (04/08/20 2105)                 Gerron Guidotti L Naoki Migliaccio

## 2020-04-09 NOTE — Progress Notes (Signed)
1 Day Post-Op Procedure(s) (LRB): TOTAL LAPAROSCOPIC HYSTERECTOMY WITH BILATERAL SALPINGECTOMY (N/A) POSTERIOR REPAIR (RECTOCELE) WITH NATIVE TISSUE REPAIR (N/A) POSSIBLE TRANSVAGINAL TAPE (TVT) PROCEDURE MIDURETHAL SLING (N/A) CYSTOSCOPY (N/A)  Subjective: Patient reports tolerating PO and no problems voiding.   Ambulating.   Objective: I have reviewed patient's vital signs, intake and output and labs. Vitals:   04/09/20 0530 04/09/20 0915  BP: (!) 98/58 112/67  Pulse: 72 80  Resp: 18 16  Temp: 98 F (36.7 C) 98.7 F (37.1 C)  SpO2: 100% 100%   3422 cc/3375 cc.  Voided 450 cc with 0 PVR.  Then voided 500 cc.   CBC    Component Value Date/Time   WBC 11.0 (H) 04/09/2020 0522   RBC 3.76 (L) 04/09/2020 0522   HGB 10.7 (L) 04/09/2020 0522   HGB 11.7 02/15/2020 1547   HCT 32.9 (L) 04/09/2020 0522   HCT 36.7 02/15/2020 1547   PLT 143 (L) 04/09/2020 0522   PLT 233 02/15/2020 1547   MCV 87.5 04/09/2020 0522   MCV 85 02/15/2020 1547   MCH 28.5 04/09/2020 0522   MCHC 32.5 04/09/2020 0522   RDW 16.6 (H) 04/09/2020 0522   RDW 15.8 (H) 02/15/2020 1547   LYMPHSABS 1.9 08/24/2017 1041   MONOABS 0.4 08/24/2017 1041   EOSABS 0.1 08/24/2017 1041   BASOSABS 0.0 08/24/2017 1041     General: alert and cooperative Resp: clear to auscultation bilaterally Cardio: regular rate and rhythm, S1, S2 normal, no murmur, click, rub or gallop GI: incision: clean, dry, intact and Abdomen is soft and nontender.  Vaginal Bleeding: minimal  Assessment: s/p Procedure(s): TOTAL LAPAROSCOPIC HYSTERECTOMY WITH BILATERAL SALPINGECTOMY (N/A) POSTERIOR REPAIR (RECTOCELE) WITH NATIVE TISSUE REPAIR (N/A) POSSIBLE TRANSVAGINAL TAPE (TVT) PROCEDURE MIDURETHAL SLING (N/A) CYSTOSCOPY (N/A): progressing well  Plan: Discharge home  No Foley catheter needed. Post op surgical instructions and recovery expectations reviewed.  Rx for Dilaudid and Motrin.  FU in office in one week.    LOS: 0 days     Arloa Koh 04/09/2020, 9:50 AM

## 2020-04-09 NOTE — Discharge Instructions (Signed)
Posterior Colporrhaphy and Sling Procedure, Care After This sheet gives you information about how to care for yourself after your procedure. Your health care provider may also give you more specific instructions. If you have problems or questions, contact your health care provider. What can I expect after the procedure? After the procedure, it is common to have:  Pain in the surgical area.  Vaginal discharge. You will need to use a sanitary pad during this time.  Fatigue. Follow these instructions at home: Incision care   Follow instructions from your health care provider about how to take care of your incision. Make sure you: ? Wash your hands with soap and water before touching the incision area. If soap and water are not available, use hand sanitizer. ? Clean your incision as told by your health care provider. ? Leave stitches (sutures), skin glue, or adhesive strips in place. These skin closures may need to stay in place for 2 weeks or longer. If adhesive strip edges start to loosen and curl up, you may trim the loose edges. Do not remove adhesive strips completely unless your health care provider tells you to do that.  Check your incision area every day for signs of infection. Check for: ? Redness, swelling, or pain. ? Fluid or blood. ? Warmth. ? Pus or a bad smell.  Check your incision every day to make sure the incision area is not separating or opening.  Do not take baths, swim, or use a hot tub until your health care provider approves. You may shower.  Keep the area between your vagina and rectum (perineal area) clean and dry. Make sure you clean the area after each bowel movement and each time you urinate.  Ask your health care provider if you can take a sitz bath or sit in a tub of clean, warm water. Activity  Do gentle, daily activity as told by your health care provider. You may be told to take short walks every day and go farther each time. Ask your health care  provider what activities are safe for you.  Limit stair climbing to once or twice a day in the first week, then slowly increase this activity.  Do not lift anything that is heavier than 10 lbs. (4.5 kg), or the limit that your health care provider tells you, until he or she says that it is safe. Avoid pushing or pulling motions.  Avoid standing for long periods of time.  Do not douche, use tampons, or have sex until your health care provider says it is okay.  Do not drive or use heavy machinery while taking prescription pain medicine. To prevent constipation  To prevent or treat constipation while you are taking prescription pain medicine, your health care provider may recommend that you: ? Take over-the-counter or prescription medicines. ? Eat foods that are high in fiber, such as fresh fruits and vegetables, whole grains, and beans. ? Drink enough fluid to keep your urine clear or pale yellow. ? Limit foods that are high in fat and processed sugars, such as fried and sweet foods. General instructions  You may be instructed to do pelvic floor exercises (kegels) as told by your health care provider.  Take over-the-counter and prescription medicines only as told by your health care provider.  Keep all follow-up visits as told by your health care provider. This is important. Contact a health care provider if:  Medicine does not help your pain.  You have frequent or urgent urination, or you are  unable to completely empty your bladder.  You feel a burning sensation when urinating.  You have fluid or blood coming from your incision.  You have pus or a bad smell coming from the incision.  Your incision feels warm to the touch.  You have redness, swelling, or pain around your incision. Get help right away if:  You have a fever or chills.  Your incision separates or opens.  You cannot urinate.  You have trouble breathing. Summary  After the procedure, it is common to have  pain, fatigue, and discharge from the vagina.  Keep the area between your vagina and rectum (perineal area) clean and dry. Make sure you clean the area after each bowel movement and each time you urinate.  Follow instructions from your health care provider on any activity restrictions after the procedure. This information is not intended to replace advice given to you by your health care provider. Make sure you discuss any questions you have with your health care provider. Document Revised: 11/05/2017 Document Reviewed: 11/23/2016 Elsevier Patient Education  2020 [place].  Total Laparoscopic Hysterectomy, Care After This sheet gives you information about how to care for yourself after your procedure. Your health care provider may also give you more specific instructions. If you have problems or questions, contact your health care provider. What can I expect after the procedure? After the procedure, it is common to have:  Pain and bruising around your incisions.  A sore throat, if a breathing tube was used during surgery.  Fatigue.  Poor appetite.  Less interest in sex. If your ovaries were also removed, it is also common to have symptoms of menopause such as hot flashes, night sweats, and lack of sleep (insomnia). Follow these instructions at home: Bathing  Do not take baths, swim, or use a hot tub until your health care provider approves. You may need to only take showers for 2-3 weeks.  Keep your bandage (dressing) dry until your health care provider says it can be removed. Incision care   Follow instructions from your health care provider about how to take care of your incisions. Make sure you: ? Wash your hands with soap and water before you change your dressing. If soap and water are not available, use hand sanitizer. ? Change your dressing as told by your health care provider. ? Leave stitches (sutures), skin glue, or adhesive strips in place. These skin closures may  need to stay in place for 2 weeks or longer. If adhesive strip edges start to loosen and curl up, you may trim the loose edges. Do not remove adhesive strips completely unless your health care provider tells you to do that.  Check your incision area every day for signs of infection. Check for: ? Redness, swelling, or pain. ? Fluid or blood. ? Warmth. ? Pus or a bad smell. Activity  Get plenty of rest and sleep.  Do not lift anything that is heavier than 10 lbs (4.5 kg) for one month after surgery, or as long as told by your health care provider.  Do not drive or use heavy machinery while taking prescription pain medicine.  Do not drive for 24 hours if you were given a medicine to help you relax (sedative).  Return to your normal activities as told by your health care provider. Ask your health care provider what activities are safe for you. Lifestyle   Do not use any products that contain nicotine or tobacco, such as cigarettes and e-cigarettes. These  can delay healing. If you need help quitting, ask your health care provider.  Do not drink alcohol until your health care provider approves. General instructions  Do not douche, use tampons, or have sex for at least 6 weeks, or as told by your health care provider.  Take over-the-counter and prescription medicines only as told by your health care provider.  To monitor yourself for a fever, take your temperature at least once a day during recovery.  If you struggle with physical or emotional changes after your procedure, speak with your health care provider or a therapist.  To prevent or treat constipation while you are taking prescription pain medicine, your health care provider may recommend that you: ? Drink enough fluid to keep your urine clear or pale yellow. ? Take over-the-counter or prescription medicines. ? Eat foods that are high in fiber, such as fresh fruits and vegetables, whole grains, and beans. ? Limit foods that are  high in fat and processed sugars, such as fried and sweet foods.  Keep all follow-up visits as told by your health care provider. This is important. Contact a health care provider if:  You have chills or a fever.  You have redness, swelling, or pain around an incision.  You have fluid or blood coming from an incision.  Your incision feels warm to the touch.  You have pus or a bad smell coming from an incision.  An incision breaks open.  You feel dizzy or light-headed.  You have pain or bleeding when you urinate.  You have diarrhea, nausea, or vomiting that does not go away.  You have abnormal vaginal discharge.  You have a rash.  You have pain that does not get better with medicine. Get help right away if:  You have a fever and your symptoms suddenly get worse.  You have severe abdominal pain.  You have chest pain.  You have shortness of breath.  You faint.  You have pain, swelling, or redness on your leg.  You have heavy vaginal bleeding with blood clots. Summary  After the procedure it is common to have abdominal pain. Your provider will give you medication for this.  Do not take baths, swim, or use a hot tub until your health care provider approves.  Do not lift anything that is heavier than 10 lbs (4.5 kg) for one month after surgery, or as long as told by your health care provider.  Notify your provider if you have any signs or symptoms of infection after the procedure. This information is not intended to replace advice given to you by your health care provider. Make sure you discuss any questions you have with your health care provider. Document Revised: 11/05/2017 Document Reviewed: 02/03/2017 Elsevier Patient Education  2020 [employer].

## 2020-04-12 ENCOUNTER — Other Ambulatory Visit: Payer: Self-pay

## 2020-04-15 ENCOUNTER — Other Ambulatory Visit: Payer: Self-pay

## 2020-04-15 ENCOUNTER — Encounter: Payer: Self-pay | Admitting: Obstetrics and Gynecology

## 2020-04-15 ENCOUNTER — Ambulatory Visit: Payer: BC Managed Care – PPO | Admitting: Obstetrics & Gynecology

## 2020-04-15 ENCOUNTER — Ambulatory Visit (INDEPENDENT_AMBULATORY_CARE_PROVIDER_SITE_OTHER): Payer: BC Managed Care – PPO | Admitting: Obstetrics and Gynecology

## 2020-04-15 VITALS — BP 112/66 | HR 76 | Temp 97.5°F | Ht 61.25 in | Wt 188.0 lb

## 2020-04-15 DIAGNOSIS — D649 Anemia, unspecified: Secondary | ICD-10-CM | POA: Diagnosis not present

## 2020-04-15 DIAGNOSIS — Z9889 Other specified postprocedural states: Secondary | ICD-10-CM

## 2020-04-15 DIAGNOSIS — H5213 Myopia, bilateral: Secondary | ICD-10-CM | POA: Diagnosis not present

## 2020-04-15 DIAGNOSIS — H04123 Dry eye syndrome of bilateral lacrimal glands: Secondary | ICD-10-CM | POA: Diagnosis not present

## 2020-04-15 NOTE — Progress Notes (Signed)
GYNECOLOGY  VISIT   HPI: 44 y.o.   Married  Caucasian  female   G2P2002 with Patient's last menstrual period was 03/20/2020 (exact date).   here for 1 week post op    TOTAL LAPAROSCOPIC HYSTERECTOMY WITH BILATERAL SALPINGECTOMY (N/A Abdomen) POSTERIOR REPAIR (RECTOCELE) WITH NATIVE TISSUE REPAIR (N/A Vagina ) TRANSVAGINAL TAPE (TVT) PROCEDURE MIDURETHAL SLING (N/A Vagina ) CYSTOSCOPY (N/A Bladder)  Pathology - cervix with acute and chronic cervicitis.  Secretory endometrium, adenomyosis, leiomyoma, benign tubes.  States she is doing great! Feels ready to do regular activities. Walking lightly in driveway.   Very little pain or bleeding. Voiding well.  Bowel function has returned.  She had loose stools from taking a lot of stool softeners.  Started probiotics.   Taking her MVI but not her iron supplement.   GYNECOLOGIC HISTORY: Patient's last menstrual period was 03/20/2020 (exact date). Contraception:  Hysterectomy Menopausal hormone therapy:  none Last mammogram:  02/21/20 Right Breast MM/US - BIRADS 3:Probably Benign Last pap smear:   06/06/19 Neg:Neg HR HPV        OB History    Gravida  2   Para  2   Term  2   Preterm  0   AB  0   Living  2     SAB  0   TAB  0   Ectopic  0   Multiple  0   Live Births  2              Patient Active Problem List   Diagnosis Date Noted  . Menorrhagia with irregular cycle 04/08/2020  . Menorrhagia 04/08/2020  . Trochanteric bursitis of left hip 01/12/2020  . Patellofemoral arthritis of right knee 05/11/2018  . Lumbar radiculopathy 03/22/2018  . Nonallopathic lesion of thoracic region 10/07/2017  . Nonallopathic lesion of cervical region 10/07/2017  . Nonallopathic lesion of lumbosacral region 10/07/2017  . Piriformis syndrome of right side 08/24/2017  . Polyarthropathy or polyarthritis of multiple sites 08/24/2017  . Closed right tarsal navicular fracture 08/02/2017  . 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 Mild episode of recurrent major depressive disorder ([place]) 02/16/2017  . Attention deficit hyperactivity disorder (ADHD) 02/16/2017  . Flatulence 02/16/2017  . BMI 33.0-33.9,adult 02/16/2017  . Psoriasis 02/16/2017  . Heterozygous factor V Leiden mutation ([place]) 09/01/2016  . Headache, menstrual migraine 06/07/2012  . Malignant melanoma ([place]) 06/07/2012  . Dysmenorrhea 06/07/2012    Past Medical History:  Diagnosis Date  . Anemia   . Anxiety   . Attention deficit hyperactivity disorder (ADHD) 02/16/2017   Sees [facility] for managment  . Calculus of kidney 06/07/2012   Overview:  Dr Karsten Ro - urology   . Cancer ([place])    melanoma removed from abdomen in 2000  . Complication of anesthesia    low BP during C-section  . Depression   . Factor V Leiden carrier ([place])    heterozygous  . Hernia, rectovaginal 06/07/2012   Overview:  Dr Corinna Capra - gyn   . History of abnormal cervical Pap smear 1997   dysplasia- LEEP  . Migraine   . Ovarian cyst 1994  . Psoriasis 02/16/2017   -sees dermatologist  . Rectocele     Past Surgical History:  Procedure Laterality Date  . BLADDER SUSPENSION N/A 04/08/2020   Procedure: POSSIBLE TRANSVAGINAL TAPE (TVT) PROCEDURE MIDURETHAL SLING;  Surgeon: Nunzio Cobbs, MD;  Location: [facility];  Service: Gynecology;  Laterality:  N/A;  . CERVICAL BIOPSY  W/ LOOP ELECTRODE EXCISION    . CESAREAN SECTION  2007 & 2010  . CYSTOSCOPY N/A 04/08/2020   Procedure: CYSTOSCOPY;  Surgeon: Nunzio Cobbs, MD;  Location: [facility];  Service: Gynecology;  Laterality: N/A;  . KNEE ARTHROPLASTY Right 1989  . OVARIAN CYST SURGERY  1994  . RECTOCELE REPAIR N/A 04/08/2020   Procedure: POSTERIOR REPAIR (RECTOCELE) WITH NATIVE TISSUE REPAIR;  Surgeon: Nunzio Cobbs, MD;  Location[facility];  Service: Gynecology;  Laterality: N/A;  . SINUS ENDO WITH FUSION    . TOTAL  LAPAROSCOPIC HYSTERECTOMY WITH SALPINGECTOMY N/A 04/08/2020   Procedure: TOTAL LAPAROSCOPIC HYSTERECTOMY WITH BILATERAL SALPINGECTOMY;  Surgeon: Megan Salon, MD;  Location: [facility];  Service: Gynecology;  Laterality: N/A;  . TUBAL LIGATION  2010  . VULVA /PERINEUM BIOPSY  01/2015    Current Outpatient Medications  Medication Sig Dispense Refill  . HYDROmorphone (DILAUDID) 2 MG tablet Take 0.5 tablets (1 mg total) by mouth every 3 (three) hours as needed for severe pain. 20 tablet 0  . ibuprofen (ADVIL) 800 MG tablet Take 1 tablet (800 mg total) by mouth every 8 (eight) hours as needed. 30 tablet 0  . Multiple Vitamin (MULTIVITAMIN WITH MINERALS) TABS tablet Take 1 tablet by mouth daily.    Marland Kitchen olopatadine (PATADAY) 0.1 % ophthalmic solution Place 1 drop into both eyes daily.    . Secukinumab (COSENTYX SENSOREADY PEN) 150 MG/ML SOAJ Inject 300 mg into the skin every 30 (thirty) days.     Marland Kitchen tretinoin (RETIN-A) 0.05 % cream Apply 1 application topically daily as needed (acne).     . Ubrogepant (UBRELVY) 100 MG TABS Take 100 mg by mouth daily as needed (migraine).     . venlafaxine XR (EFFEXOR-XR) 37.5 MG 24 hr capsule TAKE 1 CAPSULE DAILY WITH BREAKFAST. (Patient taking differently: Take 37.5 mg by mouth daily with breakfast. ) 90 capsule 1  . ferrous sulfate 325 (65 FE) MG tablet Take 325 mg by mouth daily with breakfast.     No current facility-administered medications for this visit.     ALLERGIES: Patient has no known allergies.  Family History  Problem Relation Age of Onset  . Esophageal cancer Father   . Esophageal cancer Maternal Uncle   . Rectal cancer Maternal Uncle   . Diabetes Maternal Grandmother   . Heart disease Maternal Grandmother     Social History   Socioeconomic History  . Marital status: Married    Spouse name: Not on file  . Number of children: Not on file  . Years of education: Not on file  . Highest education level: Not on file   Occupational History  . Not on file  Tobacco Use  . Smoking status: Never Smoker  . Smokeless tobacco: Never Used  Substance and Sexual Activity  . Alcohol use: Yes    Alcohol/week: 0.0 - 3.0 standard drinks    Comment: occas.  . Drug use: No  . Sexual activity: Yes    Partners: Male    Birth control/protection: Surgical    Comment: Hysterectomy  Other Topics Concern  . Not on file  Social History Narrative   Work or School: homemaker      Home Situation: 2 children      Spiritual Beliefs:      Lifestyle: regular exercise   Social Determinants of Health   Financial Resource Strain:   . Difficulty  of Paying Living Expenses:   Food Insecurity:   . Worried About Charity fundraiser in the Last Year:   . Arboriculturist in the Last Year:   Transportation Needs:   . Film/video editor (Medical):   Marland Kitchen Lack of Transportation (Non-Medical):   Physical Activity:   . Days of Exercise per Week:   . Minutes of Exercise per Session:   Stress:   . Feeling of Stress :   Social Connections:   . Frequency of Communication with Friends and Family:   . Frequency of Social Gatherings with Friends and Family:   . Attends Religious Services:   . Active Member of Clubs or Organizations:   . Attends Archivist Meetings:   Marland Kitchen Marital Status:   Intimate Partner Violence:   . Fear of Current or Ex-Partner:   . Emotionally Abused:   Marland Kitchen Physically Abused:   . Sexually Abused:     Review of Systems  Constitutional: Negative.   HENT: Negative.   Eyes: Negative.   Respiratory: Negative.   Cardiovascular: Negative.   Gastrointestinal: Negative.   Endocrine: Negative.   Genitourinary: Negative.   Musculoskeletal: Negative.   Skin: Negative.   Allergic/Immunologic: Negative.   Neurological: Negative.   Hematological: Negative.   Psychiatric/Behavioral: Negative.     PHYSICAL EXAMINATION:    BP 112/66 (BP Location: Right Arm, Patient Position: Sitting, Cuff Size:  Normal)   Pulse 76   Temp (!) 97.5 F (36.4 C) (Temporal)   Ht 5' 1.25" (1.556 m)   Wt 188 lb (85.3 kg)   LMP 03/20/2020 (Exact Date)   BMI 35.23 kg/m     General appearance: alert, cooperative and appears stated age   Abdomen: soft, non-tender, no masses,  no organomegaly Incisions intact.   Pelvic: External genitalia:  SP incisions with ecchymoses and mild induration              Urethra:  normal appearing urethra with no masses, tenderness or lesions                              Bimanual Exam:  Uterus:  Absent.  No agglutination of the vaginal walls.                                       Adnexa:  no mass, fullness, tenderness               Chaperone was present for exam.  ASSESSMENT  Doing well post op. Mild anemia.   PLAN  Surgical findings, procedure, and pathology report discussed.  Will check CBC with diff.  Continue decreased activity for 3 months post op. Keep 6 week post op appointment.    An After Visit Summary was printed and given to the patient.

## 2020-04-16 LAB — CBC WITH DIFFERENTIAL/PLATELET
Basophils Absolute: 0.1 10*3/uL (ref 0.0–0.2)
Basos: 1 %
EOS (ABSOLUTE): 0.6 10*3/uL — ABNORMAL HIGH (ref 0.0–0.4)
Eos: 6 %
Hematocrit: 36.6 % (ref 34.0–46.6)
Hemoglobin: 12.3 g/dL (ref 11.1–15.9)
Immature Grans (Abs): 0 10*3/uL (ref 0.0–0.1)
Immature Granulocytes: 0 %
Lymphocytes Absolute: 2.9 10*3/uL (ref 0.7–3.1)
Lymphs: 29 %
MCH: 29.4 pg (ref 26.6–33.0)
MCHC: 33.6 g/dL (ref 31.5–35.7)
MCV: 87 fL (ref 79–97)
Monocytes Absolute: 0.8 10*3/uL (ref 0.1–0.9)
Monocytes: 8 %
Neutrophils Absolute: 5.5 10*3/uL (ref 1.4–7.0)
Neutrophils: 56 %
Platelets: 219 10*3/uL (ref 150–450)
RBC: 4.19 x10E6/uL (ref 3.77–5.28)
RDW: 16.1 % — ABNORMAL HIGH (ref 11.7–15.4)
WBC: 9.8 10*3/uL (ref 3.4–10.8)

## 2020-04-17 DIAGNOSIS — G43009 Migraine without aura, not intractable, without status migrainosus: Secondary | ICD-10-CM | POA: Diagnosis not present

## 2020-04-17 DIAGNOSIS — F411 Generalized anxiety disorder: Secondary | ICD-10-CM | POA: Diagnosis not present

## 2020-04-17 DIAGNOSIS — R5383 Other fatigue: Secondary | ICD-10-CM | POA: Diagnosis not present

## 2020-05-08 ENCOUNTER — Telehealth: Payer: Self-pay | Admitting: Obstetrics & Gynecology

## 2020-05-08 ENCOUNTER — Other Ambulatory Visit: Payer: Self-pay

## 2020-05-08 ENCOUNTER — Encounter: Payer: Self-pay | Admitting: Obstetrics and Gynecology

## 2020-05-08 ENCOUNTER — Ambulatory Visit (INDEPENDENT_AMBULATORY_CARE_PROVIDER_SITE_OTHER): Payer: BC Managed Care – PPO | Admitting: Obstetrics and Gynecology

## 2020-05-08 VITALS — BP 110/68 | HR 68 | Temp 97.5°F | Ht 62.0 in | Wt 186.8 lb

## 2020-05-08 DIAGNOSIS — N939 Abnormal uterine and vaginal bleeding, unspecified: Secondary | ICD-10-CM

## 2020-05-08 MED ORDER — METRONIDAZOLE 500 MG PO TABS
500.0000 mg | ORAL_TABLET | Freq: Two times a day (BID) | ORAL | 0 refills | Status: DC
Start: 2020-05-08 — End: 2020-05-16

## 2020-05-08 NOTE — Progress Notes (Signed)
GYNECOLOGY  VISIT   HPI: 44 y.o.   Married  Caucasian  female   G2P2002 with Patient's last menstrual period was 03/20/2020.   here for post op spotting and odor. Patient complains of having watery pinkish brown discharge with a "foul" odor and occasional itch. TOTAL LAPAROSCOPIC HYSTERECTOMY WITH BILATERAL SALPINGECTOMY (N/A Abdomen)  POSTERIOR REPAIR (RECTOCELE) WITH NATIVE TISSUE REPAIR (N/A Vagina )  POSSIBLE TRANSVAGINAL TAPE (TVT) PROCEDURE MIDURETHAL SLING (N/A Vagina )  CYSTOSCOPY (N/A Bladder) Procedures.  Patient complaining of vaginal odor with pink to brown spotting. States bright red bleeding post op stopped 2 weeks post op.  The discharge continues.  Still wearing a panty liner.  The discharge can be clear and wet, light pink, or brown.  Maybe has a foul smell.  Occasional itch.  No vaginal burning.  Denies pelvic pain pain.   States she feels amazing.   Voiding well.  Now she does not need to slouch to void.  No leak with sneeze or laugh now when she has a full bladder! No dysuria.  Not exercising.  Lifting bare minimum.  Not doing chores or doing yard work.   GYNECOLOGIC HISTORY: Patient's last menstrual period was 03/20/2020. Contraception:  Hyst Menopausal hormone therapy:  none Last mammogram: 02/21/20 Right Breast MM/US - BIRADS 3:Probably Benign Last pap smear:   06/06/19 Neg:Neg HR HPV        OB History    Gravida  2   Para  2   Term  2   Preterm  0   AB  0   Living  2     SAB  0   TAB  0   Ectopic  0   Multiple  0   Live Births  2              Patient Active Problem List   Diagnosis Date Noted  . Menorrhagia with irregular cycle 04/08/2020  . Menorrhagia 04/08/2020  . Trochanteric bursitis of left hip 01/12/2020  . Patellofemoral arthritis of right knee 05/11/2018  . Lumbar radiculopathy 03/22/2018  . Nonallopathic lesion of thoracic region 10/07/2017  . Nonallopathic lesion of cervical region 10/07/2017  .  Nonallopathic lesion of lumbosacral region 10/07/2017  . Piriformis syndrome of right side 08/24/2017  . Polyarthropathy or polyarthritis of multiple sites 08/24/2017  . Closed right tarsal navicular fracture 08/02/2017  . 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 Mild episode of recurrent major depressive disorder ([place]) 02/16/2017  . Attention deficit hyperactivity disorder (ADHD) 02/16/2017  . Flatulence 02/16/2017  . BMI 33.0-33.9,adult 02/16/2017  . Psoriasis 02/16/2017  . Heterozygous factor V Leiden mutation ([place]) 09/01/2016  . Headache, menstrual migraine 06/07/2012  . Malignant melanoma ([place]) 06/07/2012  . Dysmenorrhea 06/07/2012    Past Medical History:  Diagnosis Date  . Anemia   . Anxiety   . Attention deficit hyperactivity disorder (ADHD) 02/16/2017   Sees [facility] for managment  . Calculus of kidney 06/07/2012   Overview:  Dr Karsten Ro - urology   . Cancer ([place])    melanoma removed from abdomen in 2000  . Complication of anesthesia    low BP during C-section  . Depression   . Factor V Leiden carrier ([place])    heterozygous  . Hernia, rectovaginal 06/07/2012   Overview:  Dr Corinna Capra - gyn   . History of abnormal cervical Pap smear 1997   dysplasia- LEEP  . Migraine   . Ovarian cyst 1994  .  Psoriasis 02/16/2017   -sees dermatologist  . Rectocele     Past Surgical History:  Procedure Laterality Date  . BLADDER SUSPENSION N/A 04/08/2020   Procedure: POSSIBLE TRANSVAGINAL TAPE (TVT) PROCEDURE MIDURETHAL SLING;  Surgeon: Nunzio Cobbs, MD;  Location: [facility];  Service: Gynecology;  Laterality: N/A;  . CERVICAL BIOPSY  W/ LOOP ELECTRODE EXCISION    . CESAREAN SECTION  2007 & 2010  . CYSTOSCOPY N/A 04/08/2020   Procedure: CYSTOSCOPY;  Surgeon: Nunzio Cobbs, MD;  Location: [facility];  Service: Gynecology;  Laterality: N/A;  . KNEE ARTHROPLASTY Right 1989  . OVARIAN CYST SURGERY   1994  . RECTOCELE REPAIR N/A 04/08/2020   Procedure: POSTERIOR REPAIR (RECTOCELE) WITH NATIVE TISSUE REPAIR;  Surgeon: Nunzio Cobbs, MD;  Location: [facility];  Service: Gynecology;  Laterality: N/A;  . SINUS ENDO WITH FUSION    . TOTAL LAPAROSCOPIC HYSTERECTOMY WITH SALPINGECTOMY N/A 04/08/2020   Procedure: TOTAL LAPAROSCOPIC HYSTERECTOMY WITH BILATERAL SALPINGECTOMY;  Surgeon: Megan Salon, MD;  Location: [facility];  Service: Gynecology;  Laterality: N/A;  . TUBAL LIGATION  2010  . VULVA /PERINEUM BIOPSY  01/2015    Current Outpatient Medications  Medication Sig Dispense Refill  . Multiple Vitamin (MULTIVITAMIN WITH MINERALS) TABS tablet Take 1 tablet by mouth daily.    Marland Kitchen olopatadine (PATADAY) 0.1 % ophthalmic solution Place 1 drop into both eyes daily.    . Secukinumab (COSENTYX SENSOREADY PEN) 150 MG/ML SOAJ Inject 300 mg into the skin every 30 (thirty) days.     Marland Kitchen tretinoin (RETIN-A) 0.05 % cream Apply 1 application topically daily as needed (acne).     . Ubrogepant (UBRELVY) 100 MG TABS Take 100 mg by mouth daily as needed (migraine).     . venlafaxine XR (EFFEXOR-XR) 37.5 MG 24 hr capsule TAKE 1 CAPSULE DAILY WITH BREAKFAST. (Patient taking differently: Take 37.5 mg by mouth daily with breakfast. ) 90 capsule 1   No current facility-administered medications for this visit.     ALLERGIES: Patient has no known allergies.  Family History  Problem Relation Age of Onset  . Esophageal cancer Father   . Esophageal cancer Maternal Uncle   . Rectal cancer Maternal Uncle   . Diabetes Maternal Grandmother   . Heart disease Maternal Grandmother     Social History   Socioeconomic History  . Marital status: Married    Spouse name: Not on file  . Number of children: Not on file  . Years of education: Not on file  . Highest education level: Not on file  Occupational History  . Not on file  Tobacco Use  . Smoking status: Never Smoker  .  Smokeless tobacco: Never Used  Substance and Sexual Activity  . Alcohol use: Yes    Alcohol/week: 0.0 - 3.0 standard drinks    Comment: occas.  . Drug use: No  . Sexual activity: Yes    Partners: Male    Birth control/protection: Surgical    Comment: Hysterectomy  Other Topics Concern  . Not on file  Social History Narrative   Work or School: homemaker      Home Situation: 2 children      Spiritual Beliefs:      Lifestyle: regular exercise   Social Determinants of Health   Financial Resource Strain:   . Difficulty of Paying Living Expenses:   Food Insecurity:   . Worried About Estate manager/land agent  of Food in the Last Year:   . [place] in the Last Year:   Transportation Needs:   . Lack of Transportation (Medical):   Marland Kitchen Lack of Transportation (Non-Medical):   Physical Activity:   . Days of Exercise per Week:   . Minutes of Exercise per Session:   Stress:   . Feeling of Stress :   Social Connections:   . Frequency of Communication with Friends and Family:   . Frequency of Social Gatherings with Friends and Family:   . Attends Religious Services:   . Active Member of Clubs or Organizations:   . Attends Archivist Meetings:   Marland Kitchen Marital Status:   Intimate Partner Violence:   . Fear of Current or Ex-Partner:   . Emotionally Abused:   Marland Kitchen Physically Abused:   . Sexually Abused:     Review of Systems  Constitutional: Negative.   HENT: Negative.   Eyes: Negative.   Respiratory: Negative.   Cardiovascular: Negative.   Gastrointestinal: Negative.   Endocrine: Negative.   Genitourinary: Positive for vaginal discharge.       Vaginal odor  Musculoskeletal: Negative.   Skin: Negative.   Allergic/Immunologic: Negative.   Neurological: Negative.   Hematological: Negative.   Psychiatric/Behavioral: Negative.     PHYSICAL EXAMINATION:    BP 110/68 (BP Location: Right Arm, Patient Position: Sitting, Cuff Size: Large)   Pulse 68   Temp (!) 97.5 F (36.4 C)  (Temporal)   Ht 5\' 2"  (1.575 m)   Wt 186 lb 12.8 oz (84.7 kg)   LMP 03/20/2020   BMI 34.17 kg/m     General appearance: alert, cooperative and appears stated age   Abdomen: soft, non-tender, no masses,  no organomegaly.  Incisions intact.    Pelvic: External genitalia:  no lesions              Urethra:  normal appearing urethra with no masses, tenderness or lesions              Bartholins and Skenes: normal                 Vagina: normal appearing vagina with normal color and discharge, no lesions              Cervix:  Absent.  Sutures still present.  Slight separation of the vaginal mucosa with no exposure of the underlying layers.  Edges look raw.  Minimal vaginal mucousy drainage. Anterior and posterior vaginal incisions intact.                 Bimanual Exam:  Uterus:   Absent.               Adnexa: no mass, fullness, tenderness                Chaperone was present for exam.  ASSESSMENT  Vaginal bleeding postop.  Minor separation of the mucosal edges, but not a true dehiscence.   PLAN  Will treat with Flagyl 500 mg po bid x 1 week.   ETOH precautions.  Reduce activity to sedentary activity.  Call for heavy bleeding, increased discharge, fever, or pain.  FU in one week when [name] and I are both in the office if possible.    An After Visit Summary was printed and given to the patient.

## 2020-05-08 NOTE — Telephone Encounter (Signed)
Patient had a hysterectomy about 4 week ago and is still having discharge and wonders if this is normal?

## 2020-05-08 NOTE — Telephone Encounter (Signed)
Spoke with patient. S/p TOTAL LAPAROSCOPIC HYSTERECTOMY WITH BILATERAL SALPINGECTOMY (N/A Abdomen) POSTERIOR REPAIR (RECTOCELE) WITH NATIVE TISSUE REPAIR (N/A Vagina ) TRANSVAGINAL TAPE (TVT) PROCEDURE MIDURETHAL SLING (N/A Vagina )CYSTOSCOPY (N/A Bladder) on 5/3 with [name] and Dr. Quincy Simmonds.   Patient reports she continues to have spotting. Color changes from pink to brown to clear with possible odor, not foul. Patient is concerned about odor, since she is unsure if it is present. Denies pain, fever/chills, itching or heavy bleeding.   Patient is aware [name] is out of the office today. OV scheduled with Dr. Quincy Simmonds for today at 9:30am. Covid 19 prescreen negative, precautions reviewed.   Routing to provider for final review. Patient is agreeable to disposition. Will close encounter.  Cc: [name]

## 2020-05-15 ENCOUNTER — Other Ambulatory Visit: Payer: Self-pay

## 2020-05-16 ENCOUNTER — Ambulatory Visit (INDEPENDENT_AMBULATORY_CARE_PROVIDER_SITE_OTHER): Payer: BC Managed Care – PPO | Admitting: Obstetrics and Gynecology

## 2020-05-16 VITALS — BP 118/80 | HR 70 | Temp 97.3°F | Ht 61.25 in | Wt 188.8 lb

## 2020-05-16 DIAGNOSIS — Z9889 Other specified postprocedural states: Secondary | ICD-10-CM

## 2020-05-16 NOTE — Progress Notes (Signed)
GYNECOLOGY  VISIT   HPI: 44 y.o.   Married  Caucasian  female   G2P2002 with Patient's last menstrual period was 03/20/2020.   here for 5 week follow up status post TOTAL LAPAROSCOPIC HYSTERECTOMY WITH BILATERAL SALPINGECTOMY (N/A Abdomen) POSTERIOR REPAIR (RECTOCELE) WITH NATIVE TISSUE REPAIR (N/A Vagina ) POSSIBLE TRANSVAGINAL TAPE (TVT) PROCEDURE MIDURETHAL SLING (N/A Vagina ) CYSTOSCOPY (N/A Bladder).  Patient is seen in follow up for minor separation of the vaginal cuff following her hysterectomy/prolapse/TVT sling surgery.   She is decreasing her activity.  No further bleeding.  Took the course of Flagyl.  Good bladder control.    GYNECOLOGIC HISTORY: Patient's last menstrual period was 03/20/2020. Contraception:  Hyst Menopausal hormone therapy:  None Last mammogram:  02/21/20 Right Breast MM/US - BIRADS 3:Probably Benign Last pap smear:   06/06/19 Neg:Neg HR HPV        OB History    Gravida  2   Para  2   Term  2   Preterm  0   AB  0   Living  2     SAB  0   TAB  0   Ectopic  0   Multiple  0   Live Births  2              Patient Active Problem List   Diagnosis Date Noted  . Menorrhagia with irregular cycle 04/08/2020  . Menorrhagia 04/08/2020  . Trochanteric bursitis of left hip 01/12/2020  . Patellofemoral arthritis of right knee 05/11/2018  . Lumbar radiculopathy 03/22/2018  . Nonallopathic lesion of thoracic region 10/07/2017  . Nonallopathic lesion of cervical region 10/07/2017  . Nonallopathic lesion of lumbosacral region 10/07/2017  . Piriformis syndrome of right side 08/24/2017  . Polyarthropathy or polyarthritis of multiple sites 08/24/2017  . Closed right tarsal navicular fracture 08/02/2017  . 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 Mild episode of recurrent major depressive disorder ([place]) 02/16/2017  . Attention deficit hyperactivity disorder (ADHD) 02/16/2017  . Flatulence 02/16/2017  . BMI 33.0-33.9,adult  02/16/2017  . Psoriasis 02/16/2017  . Heterozygous factor V Leiden mutation ([place]) 09/01/2016  . Headache, menstrual migraine 06/07/2012  . Malignant melanoma ([place]) 06/07/2012  . Dysmenorrhea 06/07/2012    Past Medical History:  Diagnosis Date  . Anemia   . Anxiety   . Attention deficit hyperactivity disorder (ADHD) 02/16/2017   Sees [facility] for managment  . Calculus of kidney 06/07/2012   Overview:  Dr Karsten Ro - urology   . Cancer ([place])    melanoma removed from abdomen in 2000  . Complication of anesthesia    low BP during C-section  . Depression   . Factor V Leiden carrier ([place])    heterozygous  . Hernia, rectovaginal 06/07/2012   Overview:  Dr Corinna Capra - gyn   . History of abnormal cervical Pap smear 1997   dysplasia- LEEP  . Migraine   . Ovarian cyst 1994  . Psoriasis 02/16/2017   -sees dermatologist  . Rectocele     Past Surgical History:  Procedure Laterality Date  . BLADDER SUSPENSION N/A 04/08/2020   Procedure: POSSIBLE TRANSVAGINAL TAPE (TVT) PROCEDURE MIDURETHAL SLING;  Surgeon: Nunzio Cobbs, MD;  Location: [facility];  Service: Gynecology;  Laterality: N/A;  . CERVICAL BIOPSY  W/ LOOP ELECTRODE EXCISION    . CESAREAN SECTION  2007 & 2010  . CYSTOSCOPY N/A 04/08/2020   Procedure: CYSTOSCOPY;  Surgeon: Yisroel Ramming,  Everardo All, MD;  Location[facility];  Service: Gynecology;  Laterality: N/A;  . KNEE ARTHROPLASTY Right 1989  . OVARIAN CYST SURGERY  1994  . RECTOCELE REPAIR N/A 04/08/2020   Procedure: POSTERIOR REPAIR (RECTOCELE) WITH NATIVE TISSUE REPAIR;  Surgeon: Nunzio Cobbs, MD;  Location[facility];  Service: Gynecology;  Laterality: N/A;  . SINUS ENDO WITH FUSION    . TOTAL LAPAROSCOPIC HYSTERECTOMY WITH SALPINGECTOMY N/A 04/08/2020   Procedure: TOTAL LAPAROSCOPIC HYSTERECTOMY WITH BILATERAL SALPINGECTOMY;  Surgeon: Megan Salon, MD;  Location: [facility];   Service: Gynecology;  Laterality: N/A;  . TUBAL LIGATION  2010  . VULVA /PERINEUM BIOPSY  01/2015    Current Outpatient Medications  Medication Sig Dispense Refill  . Multiple Vitamin (MULTIVITAMIN WITH MINERALS) TABS tablet Take 1 tablet by mouth daily.    Marland Kitchen olopatadine (PATADAY) 0.1 % ophthalmic solution Place 1 drop into both eyes daily.    . Secukinumab (COSENTYX SENSOREADY PEN) 150 MG/ML SOAJ Inject 300 mg into the skin every 30 (thirty) days.     Marland Kitchen tretinoin (RETIN-A) 0.05 % cream Apply 1 application topically daily as needed (acne).     . Ubrogepant (UBRELVY) 100 MG TABS Take 100 mg by mouth daily as needed (migraine).     . venlafaxine XR (EFFEXOR-XR) 37.5 MG 24 hr capsule TAKE 1 CAPSULE DAILY WITH BREAKFAST. (Patient taking differently: Take 37.5 mg by mouth daily with breakfast. ) 90 capsule 1   No current facility-administered medications for this visit.     ALLERGIES: Patient has no known allergies.  Family History  Problem Relation Age of Onset  . Esophageal cancer Father   . Esophageal cancer Maternal Uncle   . Rectal cancer Maternal Uncle   . Diabetes Maternal Grandmother   . Heart disease Maternal Grandmother     Social History   Socioeconomic History  . Marital status: Married    Spouse name: Not on file  . Number of children: Not on file  . Years of education: Not on file  . Highest education level: Not on file  Occupational History  . Not on file  Tobacco Use  . Smoking status: Never Smoker  . Smokeless tobacco: Never Used  Vaping Use  . Vaping Use: Never used  Substance and Sexual Activity  . Alcohol use: Yes    Alcohol/week: 0.0 - 3.0 standard drinks    Comment: occas.  . Drug use: No  . Sexual activity: Yes    Partners: Male    Birth control/protection: Surgical    Comment: Hysterectomy  Other Topics Concern  . Not on file  Social History Narrative   Work or School: homemaker      Home Situation: 2 children      Spiritual Beliefs:       Lifestyle: regular exercise   Social Determinants of Health   Financial Resource Strain:   . Difficulty of Paying Living Expenses:   Food Insecurity:   . Worried About Charity fundraiser in the Last Year:   . Arboriculturist in the Last Year:   Transportation Needs:   . Film/video editor (Medical):   Marland Kitchen Lack of Transportation (Non-Medical):   Physical Activity:   . Days of Exercise per Week:   . Minutes of Exercise per Session:   Stress:   . Feeling of Stress :   Social Connections:   . Frequency of Communication with Friends and Family:   .  Frequency of Social Gatherings with Friends and Family:   . Attends Religious Services:   . Active Member of Clubs or Organizations:   . Attends Archivist Meetings:   Marland Kitchen Marital Status:   Intimate Partner Violence:   . Fear of Current or Ex-Partner:   . Emotionally Abused:   Marland Kitchen Physically Abused:   . Sexually Abused:     Review of Systems  All other systems reviewed and are negative.   PHYSICAL EXAMINATION:    BP 118/80 (Cuff Size: Large)   Pulse 70   Temp (!) 97.3 F (36.3 C) (Temporal)   Ht 5' 1.25" (1.556 m)   Wt 188 lb 12.8 oz (85.6 kg)   LMP 03/20/2020   BMI 35.38 kg/m     General appearance: alert, cooperative and appears stated age   Pelvic: External genitalia:  no lesions              Urethra:  normal appearing urethra with no masses, tenderness or lesions              Bartholins and Skenes: normal                 Vagina:  No lesions.               Cervix:  Absent.  Suture line intact.  Raw vaginal edges in mid portion of vaginal cuff, about 1.5 cm.  Minimal bleeding after speculum introduced.                Bimanual Exam:  Uterus:  Absent.              Adnexa: no mass, fullness, tenderness               Chaperone was present for exam.  ASSESSMENT  Minor separation of vaginal mucosal incision postop, improving.  Status post course of Flagyl.  PLAN  Continue decreased activity.  FU with Dr.  Sabra Heck in 2 weeks.  Call for any concerns.    An After Visit Summary was printed and given to the patient.

## 2020-05-17 ENCOUNTER — Encounter: Payer: Self-pay | Admitting: Obstetrics and Gynecology

## 2020-05-23 ENCOUNTER — Ambulatory Visit: Payer: BC Managed Care – PPO | Admitting: Obstetrics & Gynecology

## 2020-05-27 NOTE — Progress Notes (Deleted)
GYNECOLOGY  VISIT  CC:   ***  HPI: 44 y.o. G39P2002 Married White or Caucasian female here for 6 week post op. Total laparoscopic hysterectomy with  Bilateral salpingectomy  GYNECOLOGIC HISTORY: Patient's last menstrual period was 03/20/2020. Contraception: hysterectomy Menopausal hormone therapy: ***  Patient Active Problem List   Diagnosis Date Noted  . Menorrhagia with irregular cycle 04/08/2020  . Menorrhagia 04/08/2020  . Trochanteric bursitis of left hip 01/12/2020  . Patellofemoral arthritis of right knee 05/11/2018  . Lumbar radiculopathy 03/22/2018  . Nonallopathic lesion of thoracic region 10/07/2017  . Nonallopathic lesion of cervical region 10/07/2017  . Nonallopathic lesion of lumbosacral region 10/07/2017  . Piriformis syndrome of right side 08/24/2017  . Polyarthropathy or polyarthritis of multiple sites 08/24/2017  . Closed right tarsal navicular fracture 08/02/2017  . 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 Mild episode of recurrent major depressive disorder ([place]) 02/16/2017  . Attention deficit hyperactivity disorder (ADHD) 02/16/2017  . Flatulence 02/16/2017  . BMI 33.0-33.9,adult 02/16/2017  . Psoriasis 02/16/2017  . Heterozygous factor V Leiden mutation ([place]) 09/01/2016  . Headache, menstrual migraine 06/07/2012  . Malignant melanoma ([place]) 06/07/2012  . Dysmenorrhea 06/07/2012    Past Medical History:  Diagnosis Date  . Anemia   . Anxiety   . Attention deficit hyperactivity disorder (ADHD) 02/16/2017   Sees [facility] for managment  . Calculus of kidney 06/07/2012   Overview:  Dr Karsten Ro - urology   . Cancer ([place])    melanoma removed from abdomen in 2000  . Complication of anesthesia    low BP during C-section  . Depression   . Factor V Leiden carrier ([place])    heterozygous  . Hernia, rectovaginal 06/07/2012   Overview:  Dr Corinna Capra - gyn   . History of abnormal cervical Pap smear 1997   dysplasia- LEEP  .  Migraine   . Ovarian cyst 1994  . Psoriasis 02/16/2017   -sees dermatologist  . Rectocele     Past Surgical History:  Procedure Laterality Date  . BLADDER SUSPENSION N/A 04/08/2020   Procedure: POSSIBLE TRANSVAGINAL TAPE (TVT) PROCEDURE MIDURETHAL SLING;  Surgeon: Nunzio Cobbs, MD;  Location: [facility];  Service: Gynecology;  Laterality: N/A;  . CERVICAL BIOPSY  W/ LOOP ELECTRODE EXCISION    . CESAREAN SECTION  2007 & 2010  . CYSTOSCOPY N/A 04/08/2020   Procedure: CYSTOSCOPY;  Surgeon: Nunzio Cobbs, MD;  Location: [facility];  Service: Gynecology;  Laterality: N/A;  . KNEE ARTHROPLASTY Right 1989  . OVARIAN CYST SURGERY  1994  . RECTOCELE REPAIR N/A 04/08/2020   Procedure: POSTERIOR REPAIR (RECTOCELE) WITH NATIVE TISSUE REPAIR;  Surgeon: Nunzio Cobbs, MD;  Location: [facility];  Service: Gynecology;  Laterality: N/A;  . SINUS ENDO WITH FUSION    . TOTAL LAPAROSCOPIC HYSTERECTOMY WITH SALPINGECTOMY N/A 04/08/2020   Procedure: TOTAL LAPAROSCOPIC HYSTERECTOMY WITH BILATERAL SALPINGECTOMY;  Surgeon: Megan Salon, MD;  Location: [facility];  Service: Gynecology;  Laterality: N/A;  . TUBAL LIGATION  2010  . VULVA /PERINEUM BIOPSY  01/2015    MEDS:   Current Outpatient Medications on File Prior to Visit  Medication Sig Dispense Refill  . Multiple Vitamin (MULTIVITAMIN WITH MINERALS) TABS tablet Take 1 tablet by mouth daily.    Marland Kitchen olopatadine (PATADAY) 0.1 % ophthalmic solution Place 1 drop into both eyes daily.    . Secukinumab (COSENTYX SENSOREADY  PEN) 150 MG/ML SOAJ Inject 300 mg into the skin every 30 (thirty) days.     Marland Kitchen tretinoin (RETIN-A) 0.05 % cream Apply 1 application topically daily as needed (acne).     . Ubrogepant (UBRELVY) 100 MG TABS Take 100 mg by mouth daily as needed (migraine).     . venlafaxine XR (EFFEXOR-XR) 37.5 MG 24 hr capsule TAKE 1 CAPSULE DAILY WITH BREAKFAST. (Patient  taking differently: Take 37.5 mg by mouth daily with breakfast. ) 90 capsule 1   No current facility-administered medications on file prior to visit.    ALLERGIES: Patient has no known allergies.  Family History  Problem Relation Age of Onset  . Esophageal cancer Father   . Esophageal cancer Maternal Uncle   . Rectal cancer Maternal Uncle   . Diabetes Maternal Grandmother   . Heart disease Maternal Grandmother     SH:  ***  Review of Systems  PHYSICAL EXAMINATION:    LMP 03/20/2020     General appearance: alert, cooperative and appears stated age Neck: no adenopathy, supple, symmetrical, trachea midline and thyroid {CHL AMB PHY EX THYROID NORM DEFAULT:[phone]::"normal to inspection and palpation"} CV:  {Exam; heart brief:31539} Lungs:  {pe lungs ob:314451::"clear to auscultation, no wheezes, rales or rhonchi, symmetric air entry"} Breasts: {Exam; breast:13139::"normal appearance, no masses or tenderness"} Abdomen: soft, non-tender; bowel sounds normal; no masses,  no organomegaly Lymph:  no inguinal LAD noted  Pelvic: External genitalia:  no lesions              Urethra:  normal appearing urethra with no masses, tenderness or lesions              Bartholins and Skenes: normal                 Vagina: normal appearing vagina with normal color and discharge, no lesions              Cervix: {CHL AMB PHY EX CERVIX NORM DEFAULT:[phone]::"no lesions"}              Bimanual Exam:  Uterus:  {CHL AMB PHY EX UTERUS NORM DEFAULT:[phone]::"normal size, contour, position, consistency, mobility, non-tender"}              Adnexa: {CHL AMB PHY EX ADNEXA NO MASS DEFAULT:[phone]::"no mass, fullness, tenderness"}              Rectovaginal: {yes no:[id number]}.  Confirms.              Anus:  normal sphincter tone, no lesions  Chaperone, ***Terence Lux, CMA, was present for exam.  Assessment: ***  Plan: ***   ~{NUMBERS; -10-45 JOINT ROM:10287} minutes spent with patient >50% of  time was in face to face discussion of above.

## 2020-05-28 DIAGNOSIS — R768 Other specified abnormal immunological findings in serum: Secondary | ICD-10-CM | POA: Diagnosis not present

## 2020-05-28 DIAGNOSIS — L4050 Arthropathic psoriasis, unspecified: Secondary | ICD-10-CM | POA: Diagnosis not present

## 2020-05-28 DIAGNOSIS — L409 Psoriasis, unspecified: Secondary | ICD-10-CM | POA: Diagnosis not present

## 2020-05-28 DIAGNOSIS — K591 Functional diarrhea: Secondary | ICD-10-CM | POA: Diagnosis not present

## 2020-05-30 ENCOUNTER — Ambulatory Visit: Payer: BC Managed Care – PPO | Admitting: Obstetrics & Gynecology

## 2020-05-30 NOTE — Progress Notes (Signed)
GYNECOLOGY  VISIT  CC:   Post op  HPI: 44 y.o. G74P2002 Married White or Caucasian female here for post op.  Had a superficial cuff dehiscence.  Denies vaginal bleeding or discharge.  Denies pain.  Having no issues with pain.  She is having some fatigue at the end of the day.    Has some unrelated questions.  She wants to know how long to wait after surgery before having her screening colonoscopy.  She is interested in healthy weight and wellness.  Doesn't think she needs a referral at this time.  Would like to consider pelvic PT after her recovery.  Is not leaking now.  With rectocele repair, she would like to not have a recurrence of this.    GYNECOLOGIC HISTORY: Patient's last menstrual period was 03/20/2020. Contraception: hysterectomy Menopausal hormone therapy: none  Patient Active Problem List   Diagnosis Date Noted  . Trochanteric bursitis of left hip 01/12/2020  . Patellofemoral arthritis of right knee 05/11/2018  . Lumbar radiculopathy 03/22/2018  . Nonallopathic lesion of thoracic region 10/07/2017  . Nonallopathic lesion of cervical region 10/07/2017  . Nonallopathic lesion of lumbosacral region 10/07/2017  . Piriformis syndrome of right side 08/24/2017  . Polyarthropathy or polyarthritis of multiple sites 08/24/2017  . Closed right tarsal navicular fracture 08/02/2017  . 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 Mild episode of recurrent major depressive disorder ([place]) 02/16/2017  . Attention deficit hyperactivity disorder (ADHD) 02/16/2017  . Flatulence 02/16/2017  . BMI 33.0-33.9,adult 02/16/2017  . Psoriasis 02/16/2017  . Heterozygous factor V Leiden mutation ([place]) 09/01/2016  . Headache, menstrual migraine 06/07/2012  . Malignant melanoma ([place]) 06/07/2012    Past Medical History:  Diagnosis Date  . Anemia   . Anxiety   . Attention deficit hyperactivity disorder (ADHD) 02/16/2017   Sees [facility] for managment  . Calculus  of kidney 06/07/2012   Overview:  Dr Karsten Ro - urology   . Cancer ([place])    melanoma removed from abdomen in 2000  . Complication of anesthesia    low BP during C-section  . Depression   . Factor V Leiden carrier ([place])    heterozygous  . Hernia, rectovaginal 06/07/2012   Overview:  Dr Corinna Capra - gyn   . History of abnormal cervical Pap smear 1997   dysplasia- LEEP  . Migraine   . Ovarian cyst 1994  . Psoriasis 02/16/2017   -sees dermatologist  . Rectocele     Past Surgical History:  Procedure Laterality Date  . BLADDER SUSPENSION N/A 04/08/2020   Procedure: POSSIBLE TRANSVAGINAL TAPE (TVT) PROCEDURE MIDURETHAL SLING;  Surgeon: Nunzio Cobbs, MD;  Location: [facility];  Service: Gynecology;  Laterality: N/A;  . CERVICAL BIOPSY  W/ LOOP ELECTRODE EXCISION    . CESAREAN SECTION  2007 & 2010  . CYSTOSCOPY N/A 04/08/2020   Procedure: CYSTOSCOPY;  Surgeon: Nunzio Cobbs, MD;  Location: [facility];  Service: Gynecology;  Laterality: N/A;  . KNEE ARTHROPLASTY Right 1989  . OVARIAN CYST SURGERY  1994  . RECTOCELE REPAIR N/A 04/08/2020   Procedure: POSTERIOR REPAIR (RECTOCELE) WITH NATIVE TISSUE REPAIR;  Surgeon: Nunzio Cobbs, MD;  [facility];  Service: Gynecology;  Laterality: N/A;  . SINUS ENDO WITH FUSION    . TOTAL LAPAROSCOPIC HYSTERECTOMY WITH SALPINGECTOMY N/A 04/08/2020   Procedure: TOTAL LAPAROSCOPIC HYSTERECTOMY WITH BILATERAL SALPINGECTOMY;  Surgeon: Megan Salon, MD;  Location: [place];  Service: Gynecology;  Laterality: N/A;  . TUBAL LIGATION  2010  . VULVA /PERINEUM BIOPSY  01/2015    MEDS:   Current Outpatient Medications on File Prior to Visit  Medication Sig Dispense Refill  . Multiple Vitamin (MULTIVITAMIN WITH MINERALS) TABS tablet Take 1 tablet by mouth daily.    Marland Kitchen olopatadine (PATADAY) 0.1 % ophthalmic solution Place 1 drop into both eyes daily.    . Secukinumab  (COSENTYX SENSOREADY PEN) 150 MG/ML SOAJ Inject 300 mg into the skin every 30 (thirty) days.     Marland Kitchen Ubrogepant (UBRELVY) 100 MG TABS Take 100 mg by mouth daily as needed (migraine).     . venlafaxine XR (EFFEXOR-XR) 37.5 MG 24 hr capsule TAKE 1 CAPSULE DAILY WITH BREAKFAST. (Patient taking differently: Take 37.5 mg by mouth daily with breakfast. ) 90 capsule 1  . tretinoin (RETIN-A) 0.05 % cream Apply 1 application topically daily as needed (acne).  (Patient not taking: Reported on 05/31/2020)     No current facility-administered medications on file prior to visit.    ALLERGIES: Patient has no known allergies.  Family History  Problem Relation Age of Onset  . Esophageal cancer Father   . Esophageal cancer Maternal Uncle   . Rectal cancer Maternal Uncle   . Diabetes Maternal Grandmother   . Heart disease Maternal Grandmother     SH:  Married, non smoker  Review of Systems  Constitutional: Negative.   HENT: Negative.   Eyes: Negative.   Respiratory: Negative.   Cardiovascular: Negative.   Gastrointestinal: Negative.   Endocrine: Negative.   Genitourinary: Negative.   Musculoskeletal: Negative.   Skin: Negative.   Allergic/Immunologic: Negative.   Neurological: Negative.   Hematological: Negative.   Psychiatric/Behavioral: Negative.     PHYSICAL EXAMINATION:    BP 108/62   Pulse 70   Temp (!) 97.5 F (36.4 C) (Skin)   Resp 16   Wt 188 lb (85.3 kg)   LMP 03/20/2020   BMI 35.23 kg/m     General appearance: alert, cooperative and appears stated age Abdomen: soft, non-tender; incision c/d/i Lymph:  no inguinal LAD noted  Pelvic: External genitalia:  no lesions              Urethra:  normal appearing urethra with no masses, tenderness or lesions              Bartholins and Skenes: normal                 Vagina: normal appearing vagina with normal color and discharge, no lesions, cuff well approximated and no evidence of dehiscence noted              Cervix: absent               Bimanual Exam:  Uterus:  uterus absent              Adnexa: no mass, fullness, tenderness  Assessment: S/p TLH/bilateral salpingectomy, posterior repair, TVT with normal exam today  Plan: Restrictions discussed.  Pelvic rest continues to be recommended.  Recheck 4 weeks.

## 2020-05-31 ENCOUNTER — Ambulatory Visit (INDEPENDENT_AMBULATORY_CARE_PROVIDER_SITE_OTHER): Payer: BC Managed Care – PPO | Admitting: Obstetrics & Gynecology

## 2020-05-31 ENCOUNTER — Other Ambulatory Visit: Payer: Self-pay

## 2020-05-31 ENCOUNTER — Telehealth: Payer: Self-pay

## 2020-05-31 ENCOUNTER — Encounter: Payer: Self-pay | Admitting: Obstetrics & Gynecology

## 2020-05-31 VITALS — BP 108/62 | HR 70 | Temp 97.5°F | Resp 16 | Wt 188.0 lb

## 2020-05-31 DIAGNOSIS — Z9889 Other specified postprocedural states: Secondary | ICD-10-CM

## 2020-05-31 NOTE — Telephone Encounter (Signed)
Spoke with patient. OV scheduled for 7/21 at 10am with [name].  Patient is agreeable to date and time.   Encounter closed.

## 2020-05-31 NOTE — Telephone Encounter (Signed)
Patient needs a 4 week follow up from today's visit (05/31/20). No available appointments, need triage to assist.

## 2020-06-08 DIAGNOSIS — J40 Bronchitis, not specified as acute or chronic: Secondary | ICD-10-CM | POA: Diagnosis not present

## 2020-06-08 DIAGNOSIS — R05 Cough: Secondary | ICD-10-CM | POA: Diagnosis not present

## 2020-06-11 ENCOUNTER — Telehealth: Payer: Self-pay | Admitting: Obstetrics & Gynecology

## 2020-06-11 DIAGNOSIS — N816 Rectocele: Secondary | ICD-10-CM

## 2020-06-11 DIAGNOSIS — R102 Pelvic and perineal pain: Secondary | ICD-10-CM

## 2020-06-11 NOTE — Telephone Encounter (Signed)
Effie Berkshire from [facility] at [place] called to check on referral for patient. [phone].

## 2020-06-11 NOTE — Telephone Encounter (Signed)
Call [facility] and spoke with Autumn [name]. States pt called them to make appt for pelvic PT post surgery for rectocele repair. Pt scheduled for pelvic PT on 07/10/20.  Advised per Dr Ammie Ferrier OV post op note on 05/31/20, ok for referral.   Would like to consider pelvic PT after her recovery.  Is not leaking now.  With rectocele repair, she would like to not have a recurrence of this.    Referral placed at [facility] for pelvic PT.  Autumn [name] will review with Dr Sabra Heck and return call with any additional recommendations if needed. Agreeable. Autumn [name] to call pt and make aware of placed referral.   Routing to Dr Sabra Heck for review.  Encounter closed. Cc: Autumn [name], referral done FYI.

## 2020-06-12 DIAGNOSIS — J208 Acute bronchitis due to other specified organisms: Secondary | ICD-10-CM | POA: Diagnosis not present

## 2020-06-25 NOTE — Progress Notes (Signed)
GYNECOLOGY  VISIT  CC:   4 week post opt.   HPI: 44 y.o. G59P2002 Married White or Caucasian female here for 1 week post op following total laparoscopic hysterectomy with bilateral salpingectomy posterior repair. Patient states that she has been having some itching in the labia area.  Denies vaginal bleeding.  She is having some vaginal odor with the itching.  She did recently have a URI and bronchitis.  She had a lot of coughing.  Was seen in urgent care and was on prednisone, cough syrup, and an inhaler.  She is much, much better at this point.    She is going to start PT in two weeks.  She has experienced two episodes of urinary incontinence.  These were when she had a very bladder.    She weaned herself off of Venlafaxine.  She was on 37.5mg  dosage.  She isn't sure this was the best idea.  She's having a little dizziness.  She's having some fatigue.    Wants to know if ok to give blood.  GYNECOLOGIC HISTORY: Patient's last menstrual period was 03/20/2020. Contraception: post hysterectomy  Menopausal hormone therapy: none   Patient Active Problem List   Diagnosis Date Noted  . Trochanteric bursitis of left hip 01/12/2020  . Patellofemoral arthritis of right knee 05/11/2018  . Lumbar radiculopathy 03/22/2018  . Nonallopathic lesion of thoracic region 10/07/2017  . Nonallopathic lesion of cervical region 10/07/2017  . Nonallopathic lesion of lumbosacral region 10/07/2017  . Piriformis syndrome of right side 08/24/2017  . Polyarthropathy or polyarthritis of multiple sites 08/24/2017  . Closed right tarsal navicular fracture 08/02/2017  . 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 Mild episode of recurrent major depressive disorder ([place]) 02/16/2017  . Attention deficit hyperactivity disorder (ADHD) 02/16/2017  . BMI 33.0-33.9,adult 02/16/2017  . Psoriasis 02/16/2017  . Heterozygous factor V Leiden mutation ([place]) 09/01/2016  . Headache, menstrual migraine 06/07/2012  .  Malignant melanoma ([place]) 06/07/2012    Past Medical History:  Diagnosis Date  . Anxiety   . Attention deficit hyperactivity disorder (ADHD) 02/16/2017   Sees [facility] for managment  . Calculus of kidney 06/07/2012   Overview:  Dr Karsten Ro - urology   . Cancer ([place])    melanoma removed from abdomen in 2000  . Complication of anesthesia    low BP during C-section  . Depression   . Factor V Leiden carrier ([place])    heterozygous  . Hernia, rectovaginal 06/07/2012   Overview:  Dr Corinna Capra - gyn   . History of abnormal cervical Pap smear 1997   dysplasia- LEEP  . History of reconstructive repair of rectocele   . Migraine   . Ovarian cyst 1994  . Psoriasis 02/16/2017   -sees dermatologist    Past Surgical History:  Procedure Laterality Date  . BLADDER SUSPENSION N/A 04/08/2020   Procedure: POSSIBLE TRANSVAGINAL TAPE (TVT) PROCEDURE MIDURETHAL SLING;  Surgeon: Nunzio Cobbs, MD;  Location: [facility];  Service: Gynecology;  Laterality: N/A;  . CERVICAL BIOPSY  W/ LOOP ELECTRODE EXCISION    . CESAREAN SECTION  2007 & 2010  . CYSTOSCOPY N/A 04/08/2020   Procedure: CYSTOSCOPY;  Surgeon: Nunzio Cobbs, MD;  Location: [facility];  Service: Gynecology;  Laterality: N/A;  . KNEE ARTHROPLASTY Right 1989  . OVARIAN CYST SURGERY  1994  . RECTOCELE REPAIR N/A 04/08/2020   Procedure: POSTERIOR REPAIR (RECTOCELE) WITH NATIVE TISSUE REPAIR;  Surgeon: Nunzio Cobbs, MD;  Location: [facility];  Service: Gynecology;  Laterality: N/A;  . SINUS ENDO WITH FUSION    . TOTAL LAPAROSCOPIC HYSTERECTOMY WITH SALPINGECTOMY N/A 04/08/2020   Procedure: TOTAL LAPAROSCOPIC HYSTERECTOMY WITH BILATERAL SALPINGECTOMY;  Surgeon: Megan Salon, MD;  Location: [facility];  Service: Gynecology;  Laterality: N/A;  . TUBAL LIGATION  2010  . VULVA /PERINEUM BIOPSY  01/2015    MEDS:   Current Outpatient Medications  on File Prior to Visit  Medication Sig Dispense Refill  . Multiple Vitamin (MULTIVITAMIN WITH MINERALS) TABS tablet Take 1 tablet by mouth daily.    Marland Kitchen olopatadine (PATADAY) 0.1 % ophthalmic solution Place 1 drop into both eyes daily.    . Secukinumab (COSENTYX SENSOREADY PEN) 150 MG/ML SOAJ Inject 300 mg into the skin every 30 (thirty) days.     Marland Kitchen tretinoin (RETIN-A) 0.05 % cream Apply 1 application topically daily as needed (acne).     . Ubrogepant (UBRELVY) 100 MG TABS Take 100 mg by mouth daily as needed (migraine).      No current facility-administered medications on file prior to visit.    ALLERGIES: Patient has no known allergies.  Family History  Problem Relation Age of Onset  . Esophageal cancer Father   . Esophageal cancer Maternal Uncle   . Rectal cancer Maternal Uncle   . Diabetes Maternal Grandmother   . Heart disease Maternal Grandmother     SH:  Married, non smoker  Review of Systems  All other systems reviewed and are negative.   PHYSICAL EXAMINATION:    BP 122/64   Pulse 99   Ht 5' 1.25" (1.556 m)   Wt 190 lb 9.6 oz (86.5 kg)   LMP 03/20/2020   SpO2 100%   BMI 35.72 kg/m     General appearance: alert, cooperative and appears stated age Abdomen: soft, non-tender; bowel sounds normal; no masses,  no organomegaly Lymph:  no inguinal LAD noted  Pelvic: External genitalia:  no lesions              Urethra:  normal appearing urethra with no masses, tenderness or lesions              Bartholins and Skenes: normal                 Vagina: normal appearing vagina with normal color and discharge, no lesions, well healed suture line and cuff              Cervix: absent              Bimanual Exam:  Uterus: surgically absent, cuff fully intact and no suture palpated              Adnexa: no mass, fullness, tenderness               Chaperone, Gae Dry, CMA, was present for exam.  Assessment: S/p TLH/bilateral salpingectomy/TVT and rectocele repair Superficial  vaginal cuff dehiscence early in post op process, fully healed at this time Mild depression, recently stopped Effexor Mild vaginal itching  Plan: Pt can resume normal activity after 12 full weeks post op She is going to monitor mood symptoms and let me know if feels she needs to restart medication Ok to proceed with blood donation Affirm obtained

## 2020-06-26 ENCOUNTER — Encounter: Payer: Self-pay | Admitting: Obstetrics & Gynecology

## 2020-06-26 ENCOUNTER — Other Ambulatory Visit: Payer: Self-pay

## 2020-06-26 ENCOUNTER — Ambulatory Visit (INDEPENDENT_AMBULATORY_CARE_PROVIDER_SITE_OTHER): Payer: BC Managed Care – PPO | Admitting: Obstetrics & Gynecology

## 2020-06-26 VITALS — BP 122/64 | HR 99 | Ht 61.25 in | Wt 190.6 lb

## 2020-06-26 DIAGNOSIS — N898 Other specified noninflammatory disorders of vagina: Secondary | ICD-10-CM | POA: Diagnosis not present

## 2020-06-27 ENCOUNTER — Encounter: Payer: Self-pay | Admitting: Obstetrics & Gynecology

## 2020-06-28 ENCOUNTER — Telehealth: Payer: Self-pay

## 2020-06-28 LAB — VAGINITIS/VAGINOSIS, DNA PROBE
Candida Species: NEGATIVE
Gardnerella vaginalis: NEGATIVE
Trichomonas vaginosis: NEGATIVE

## 2020-06-28 NOTE — Telephone Encounter (Signed)
Patient notified of results as written by provider 

## 2020-06-28 NOTE — Telephone Encounter (Signed)
Left message for call back.

## 2020-06-28 NOTE — Telephone Encounter (Signed)
Patient returned call

## 2020-06-28 NOTE — Telephone Encounter (Signed)
-----   Message from Megan Salon, MD sent at 06/28/2020  7:24 AM EDT ----- Please let pt know her vaginitis testing is negative.  If she has any worsening of symptoms, I do want her to call.  Ok to use a little aquaphor or OTC hydrocortisone cream externally if thinks this will help

## 2020-07-02 DIAGNOSIS — E7800 Pure hypercholesterolemia, unspecified: Secondary | ICD-10-CM | POA: Diagnosis not present

## 2020-07-02 DIAGNOSIS — Z1159 Encounter for screening for other viral diseases: Secondary | ICD-10-CM | POA: Diagnosis not present

## 2020-07-02 DIAGNOSIS — Z0000 Encounter for general adult medical examination without abnormal findings: Secondary | ICD-10-CM | POA: Diagnosis not present

## 2020-07-09 ENCOUNTER — Encounter (INDEPENDENT_AMBULATORY_CARE_PROVIDER_SITE_OTHER): Payer: Self-pay | Admitting: Family Medicine

## 2020-07-09 ENCOUNTER — Ambulatory Visit (INDEPENDENT_AMBULATORY_CARE_PROVIDER_SITE_OTHER): Payer: BC Managed Care – PPO | Admitting: Family Medicine

## 2020-07-09 ENCOUNTER — Other Ambulatory Visit: Payer: Self-pay

## 2020-07-09 VITALS — BP 101/68 | HR 71 | Temp 98.3°F | Ht 62.0 in | Wt 187.0 lb

## 2020-07-09 DIAGNOSIS — G4719 Other hypersomnia: Secondary | ICD-10-CM

## 2020-07-09 DIAGNOSIS — F3289 Other specified depressive episodes: Secondary | ICD-10-CM

## 2020-07-09 DIAGNOSIS — F908 Attention-deficit hyperactivity disorder, other type: Secondary | ICD-10-CM

## 2020-07-09 DIAGNOSIS — L4050 Arthropathic psoriasis, unspecified: Secondary | ICD-10-CM

## 2020-07-09 DIAGNOSIS — Z9189 Other specified personal risk factors, not elsewhere classified: Secondary | ICD-10-CM | POA: Diagnosis not present

## 2020-07-09 DIAGNOSIS — E669 Obesity, unspecified: Secondary | ICD-10-CM

## 2020-07-09 DIAGNOSIS — R5383 Other fatigue: Secondary | ICD-10-CM | POA: Diagnosis not present

## 2020-07-09 DIAGNOSIS — G43809 Other migraine, not intractable, without status migrainosus: Secondary | ICD-10-CM | POA: Diagnosis not present

## 2020-07-09 DIAGNOSIS — R0602 Shortness of breath: Secondary | ICD-10-CM

## 2020-07-09 DIAGNOSIS — Z6834 Body mass index (BMI) 34.0-34.9, adult: Secondary | ICD-10-CM

## 2020-07-09 DIAGNOSIS — D6851 Activated protein C resistance: Secondary | ICD-10-CM

## 2020-07-09 DIAGNOSIS — Z0289 Encounter for other administrative examinations: Secondary | ICD-10-CM

## 2020-07-09 LAB — BASIC METABOLIC PANEL
BUN: 9 (ref 4–21)
CO2: 22 (ref 13–22)
Chloride: 105 (ref 99–108)
Creatinine: 0.7 (ref 0.5–1.1)
Glucose: 95
Potassium: 4.7 (ref 3.4–5.3)
Sodium: 138 (ref 137–147)

## 2020-07-09 LAB — COMPREHENSIVE METABOLIC PANEL
Albumin: 3.6 (ref 3.5–5.0)
Calcium: 8.4 — AB (ref 8.7–10.7)
GFR calc Af Amer: 119
GFR calc non Af Amer: 103
Globulin: 2.2

## 2020-07-09 LAB — LIPID PANEL
Cholesterol: 197 (ref 0–200)
HDL: 55 (ref 35–70)
LDL Cholesterol: 125
Triglycerides: 92 (ref 40–160)

## 2020-07-09 LAB — CBC AND DIFFERENTIAL
HCT: 39 (ref 36–46)
Hemoglobin: 12.9 (ref 12.0–16.0)
Neutrophils Absolute: 4
Platelets: 167 (ref 150–399)
WBC: 6.6

## 2020-07-09 LAB — CBC: RBC: 4.27 (ref 3.87–5.11)

## 2020-07-09 LAB — HEPATIC FUNCTION PANEL
ALT: 16 (ref 7–35)
AST: 15 (ref 13–35)
Alkaline Phosphatase: 62 (ref 25–125)
Bilirubin, Total: 0.6

## 2020-07-09 LAB — HIV ANTIBODY (ROUTINE TESTING W REFLEX): HIV 1&2 Ab, 4th Generation: NONREACTIVE

## 2020-07-09 LAB — TSH: TSH: 2.94 (ref 0.41–5.90)

## 2020-07-09 LAB — NOVEL CORONAVIRUS, NAA: SARS-CoV-2, NAA: NEGATIVE

## 2020-07-09 MED ORDER — LISDEXAMFETAMINE DIMESYLATE 40 MG PO CAPS: 40.0000 mg | ORAL_CAPSULE | ORAL | 0 refills | Status: DC

## 2020-07-09 NOTE — Progress Notes (Signed)
Chief Complaint:   OBESITY Autumn [name] (MR# [id number]) is a 44 y.o. female who presents for evaluation and treatment of obesity and related comorbidities. Current BMI is Body mass index is 34.2 kg/m. Autumn [name] has been struggling with her weight for many years and has been unsuccessful in either losing weight, maintaining weight loss, or reaching her healthy weight goal.  Autumn [name] is currently in the action stage of change and ready to dedicate time achieving and maintaining a healthier weight. Autumn [name] is interested in becoming our patient and working on intensive lifestyle modifications including (but not limited to) diet and exercise for weight loss.  Autumn [name]'s habits were reviewed today and are as follows: Her family eats meals together, she thinks her family will eat healthier with her, her desired weight loss is 40 pounds, she started gaining weight after childbirth in 2007 and 2010, her heaviest weight ever was her current weight pounds, she craves sugar, especially ice cream in the summer, and carbs (bread, etc.), she snacks frequently in the evenings and she struggles with emotional eating.  Depression Screen Autumn [name]'s Food and Mood (modified PHQ-9) score was 16.  Depression screen PHQ 2/9 07/09/2020  Decreased Interest 3  Down, Depressed, Hopeless 3  PHQ - 2 Score 6  Altered sleeping 1  Tired, decreased energy 3  Change in appetite 2  Feeling bad or failure about yourself  3  Trouble concentrating 1  Moving slowly or fidgety/restless 0  Suicidal thoughts 0  PHQ-9 Score 16  Difficult doing work/chores Not difficult at all   Subjective:   1. Other fatigue Autumn [name] admits to daytime somnolence and reports waking up still tired. Patent has a history of symptoms of daytime fatigue and morning fatigue. Autumn [name] generally gets 8 hours of sleep per night, and states that she has generally restful sleep. Snoring is not present. Apneic episodes are not present. Epworth Sleepiness  Score is 14.  2. SOB (shortness of breath) on exertion Autumn [name] notes increasing shortness of breath with exercising and seems to be worsening over time with weight gain. She notes getting out of breath sooner with activity than she used to. This has gotten worse recently. Autumn [name] denies shortness of breath at rest or orthopnea.  3. Psoriatic arthritis ([place]) Autumn [name] has psoriatic arthritis and is taking Cosentyx.  4. Other migraine without status migrainosus, not intractable Autumn [name] suffers from migraines and takes Ubrelvy 100 mg daily as needed.  5. Attention deficit hyperactivity disorder (ADHD), other type Autumn [name] says she was diagnosed 15 years ago.  She is followed by [name] at [facility].  She does not feel like adderall or Vyvanse work anymore for her.  6. Excessive daytime sleepiness Autumn [name] has occasional snoring and excessive daytime sleepiness.  Epworth sleepiness score is 14.  7. Heterozygous factor V Leiden mutation ([place]) Will see if there is increased risk with estrogen replacement.  8. Other depression, with emotional eating Autumn [name] is struggling with emotional eating and using food for comfort to the extent that it is negatively impacting her health. She has been working on behavior modification techniques to help reduce her emotional eating and has been somewhat successful. She shows no sign of suicidal or homicidal ideations.  PHQ-9 is 16.  9. At risk for constipation Autumn [name] is at increased risk for constipation due to changing to a moderate to high protein diet. Fiber and water intake importance reviewed.   Assessment/Plan:   1. Other fatigue Autumn [name] does feel that her weight is causing  her energy to be lower than it should be. Fatigue may be related to obesity, depression or many other causes. Labs will be ordered, and in the meanwhile, L[place] will focus on self care including making healthy food choices, increasing physical activity and focusing on  stress reduction.  Orders - EKG 12-Lead  2. SOB (shortness of breath) on exertion Autumn [name] does feel that she gets out of breath more easily that she used to when she exercises. Autumn [name]'s shortness of breath appears to be obesity related and exercise induced. She has agreed to work on weight loss and gradually increase exercise to treat her exercise induced shortness of breath. Will continue to monitor closely.  3. Psoriatic arthritis ([place]) Current treatment plan is effective, no change in therapy. Orders and follow up as documented in patient record.  4. Other migraine without status migrainosus, not intractable Well controlled.  No signs of complications, medication side effects, or red flags.  Continue current regimen.    5. Attention deficit hyperactivity disorder (ADHD) Will prescribe Vyvanse to 40 mg daily.  Orders - lisdexamfetamine (VYVANSE) 40 MG capsule; Take 1 capsule (40 mg total) by mouth every morning.  Dispense: 30 capsule; Refill: 0  6. Excessive daytime sleepiness Based on intake questionnaire. Referral to Sleep Medicine has been placed for evaluation.  Orders - Ambulatory referral to Neurology  7. Heterozygous factor V Leiden mutation [facility]) Will continue to monitor.  8. Other depression, with emotional eating Behavior modification techniques were discussed today to help Autumn [name] deal with her emotional/non-hunger eating behaviors.  Orders and follow up as documented in patient record.   9. At risk for constipation Autumn [name] was given approximately 15 minutes of counseling today regarding prevention of constipation. She was encouraged to increase water and fiber intake.   10. Class 1 obesity with serious comorbidity and body mass index (BMI) of 34.0 to 34.9 in adult, unspecified obesity type Autumn [name] is currently in the action stage of change and her goal is to continue with weight loss efforts. I recommend Autumn [name] begin the structured treatment plan as follows:  She has  agreed to the Category 2 Plan.  Exercise goals: No exercise has been prescribed at this time.   Behavioral modification strategies: increasing lean protein intake, decreasing simple carbohydrates, increasing vegetables, increasing water intake, decreasing sodium intake and increasing high fiber foods.  She was informed of the importance of frequent follow-up visits to maximize her success with intensive lifestyle modifications for her multiple health conditions. She was informed we would discuss her lab results at her next visit unless there is a critical issue that needs to be addressed sooner. Autumn [name] agreed to keep her next visit at the agreed upon time to discuss these results.  Objective:   Blood pressure 101/68, pulse 71, temperature 98.3 F (36.8 C), temperature source Oral, height 5\' 2"  (1.575 m), weight 187 lb (84.8 kg), last menstrual period 03/20/2020, SpO2 99 %. Body mass index is 34.2 kg/m.  EKG: Normal sinus rhythm, rate 75 bpm.  Indirect Calorimeter completed today shows a VO2 of 254 and a REE of 1771.  Her calculated basal metabolic rate is 4010 thus her basal metabolic rate is better than expected.  General: Cooperative, alert, well developed, in no acute distress. HEENT: Conjunctivae and lids unremarkable. Cardiovascular: Regular rhythm.  Lungs: Normal work of breathing. Neurologic: No focal deficits.   Lab Results  Component Value Date   CREATININE 0.61 03/28/2020   BUN 14 03/28/2020   NA 139 03/28/2020   K  4.5 03/28/2020   CL 107 03/28/2020   CO2 29 03/28/2020   Lab Results  Component Value Date   ALT 17 08/24/2017   AST 17 08/24/2017   ALKPHOS 56 08/24/2017   BILITOT 0.8 08/24/2017   Lab Results  Component Value Date   TSH 2.30 08/24/2017   Lab Results  Component Value Date   WBC 9.8 04/15/2020   HGB 12.3 04/15/2020   HCT 36.6 04/15/2020   MCV 87 04/15/2020   PLT 219 04/15/2020   Lab Results  Component Value Date   IRON 40 02/15/2020   TIBC  317 02/15/2020   FERRITIN 9 (L) 02/15/2020   Attestation Statements:   This is the patient's first visit at [facility]. The patient's NEW PATIENT PACKET was reviewed at length. Included in the packet: current and past health history, medications, allergies, ROS, gynecologic history (women only), surgical history, family history, social history, weight history, weight loss surgery history (for those that have had weight loss surgery), nutritional evaluation, mood and food questionnaire, PHQ9, Epworth questionnaire, sleep habits questionnaire, patient life and health improvement goals questionnaire. These will all be scanned into the patient's chart under media.   During the visit, I independently reviewed the patient's EKG, bioimpedance scale results, and indirect calorimeter results. I used this information to tailor a meal plan for the patient that will help her to lose weight and will improve her obesity-related conditions going forward. I performed a medically necessary appropriate examination and/or evaluation. I discussed the assessment and treatment plan with the patient. The patient was provided an opportunity to ask questions and all were answered. The patient agreed with the plan and demonstrated an understanding of the instructions. Labs were ordered at this visit and will be reviewed at the next visit unless more critical results need to be addressed immediately. Clinical information was updated and documented in the EMR.   I, Water quality scientist, CMA, am acting as transcriptionist for Briscoe Deutscher, DO  I have reviewed the above documentation for accuracy and completeness, and I agree with the above. Briscoe Deutscher, DO

## 2020-07-10 ENCOUNTER — Other Ambulatory Visit: Payer: Self-pay

## 2020-07-10 ENCOUNTER — Encounter: Payer: Self-pay | Admitting: Physical Therapy

## 2020-07-10 ENCOUNTER — Ambulatory Visit: Payer: BC Managed Care – PPO | Attending: Physical Medicine and Rehabilitation | Admitting: Physical Therapy

## 2020-07-10 DIAGNOSIS — R635 Abnormal weight gain: Secondary | ICD-10-CM | POA: Diagnosis not present

## 2020-07-10 DIAGNOSIS — Z0000 Encounter for general adult medical examination without abnormal findings: Secondary | ICD-10-CM | POA: Diagnosis not present

## 2020-07-10 DIAGNOSIS — G4700 Insomnia, unspecified: Secondary | ICD-10-CM | POA: Diagnosis not present

## 2020-07-10 DIAGNOSIS — R278 Other lack of coordination: Secondary | ICD-10-CM | POA: Insufficient documentation

## 2020-07-10 DIAGNOSIS — N393 Stress incontinence (female) (male): Secondary | ICD-10-CM | POA: Insufficient documentation

## 2020-07-10 DIAGNOSIS — K644 Residual hemorrhoidal skin tags: Secondary | ICD-10-CM | POA: Diagnosis not present

## 2020-07-10 DIAGNOSIS — L409 Psoriasis, unspecified: Secondary | ICD-10-CM | POA: Diagnosis not present

## 2020-07-10 DIAGNOSIS — M6281 Muscle weakness (generalized): Secondary | ICD-10-CM | POA: Diagnosis not present

## 2020-07-10 DIAGNOSIS — Z1211 Encounter for screening for malignant neoplasm of colon: Secondary | ICD-10-CM | POA: Diagnosis not present

## 2020-07-10 NOTE — Patient Instructions (Addendum)
Moisturizers . They are used in the vagina to hydrate the mucous membrane that make up the vaginal canal. . Designed to keep a more normal acid balance (ph) . Once placed in the vagina, it will last between two to three days.  . Use 2-3 times per week at bedtime  . Ingredients to avoid is glycerin and fragrance, can increase chance of infection . Should not be used just before sex due to causing irritation . Most are gels administered either in a tampon-shaped applicator or as a vaginal suppository. They are non-hormonal.   Types of Moisturizers  . Vitamin E vaginal suppositories- Whole foods, Amazon . Moist Again . Coconut oil- can break down condoms . Julva- (Do no use if on Tamoxifen) amazon . Yes moisturizer- amazon . NeuEve Silk , NeuEve Silver for menopausal or over 65 (if have severe vaginal atrophy or cancer treatments use NeuEve Silk for  1 month than move to [employer])- [employer], MapleFlower.dk . Olive and Bee intimate cream- www.oliveandbee.com.au . Mae vaginal Jenkins . Aloe .    Creams to use externally on the Vulva area  Albertson's (good for for cancer patients that had radiation to the area)- [place] or [employer].FlyingBasics.com.br  V-magic cream - amazon  Julva-amazon  Vital "V Wild Yam salve ( help moisturize and help with thinning vulvar area, does have [place] by Irwin Brakeman labial moisturizer ([place],   Coconut or olive oil  aloe   Things to avoid in the vaginal area . Do not use things to irritate the vulvar area . No lotions just specialized creams for the vulva area- Neogyn, V-magic, No soaps; can use Aveeno or Calendula cleanser if needed. Must be gentle . No deodorants . No douches . Good to sleep without underwear to let the vaginal area to air out . No scrubbing: spread the lips to let warm water rinse over labias and pat dry Access Code:  [email or web address] URL: [email or web address] Date: 07/10/2020 Prepared by: Jari Favre  Exercises Supine Pelvic Floor Contraction - 3 x daily - 7 x weekly - 1 sets - 5 reps - 5 sec hold Sit to stand pelvic - blow as you go - 3 x daily - 7 x weekly - 10 reps - 1 sets

## 2020-07-10 NOTE — Therapy (Addendum)
[facility] [address] [place], [place], [address] Phone: [phone]   Fax:  [phone]  Physical Therapy Evaluation  Patient Details  Name: Autumn [name] MRN: [id number] Date of Birth: 12/13/1975 Referring Provider (PT): Janeann Merl, MD   Encounter Date: 07/10/2020   PT End of Session - 07/10/20 1132    Visit Number 1    Date for PT Re-Evaluation 09/04/20    PT Start Time 0930    PT Stop Time 1016    PT Time Calculation (min) 46 min    Activity Tolerance Patient tolerated treatment well    Behavior During Therapy [facility] for tasks assessed/performed           Past Medical History:  Diagnosis Date  . Anemia   . Anxiety   . Attention deficit hyperactivity disorder (ADHD) 02/16/2017   Sees [facility] for managment  . Back pain   . Calculus of kidney 06/07/2012   Overview:  Dr Karsten Ro - urology   . Cancer ([place])    melanoma removed from abdomen in 2000  . Complication of anesthesia    low BP during C-section  . Depression   . Factor V Leiden carrier ([place])    heterozygous  . Hernia, rectovaginal 06/07/2012   Overview:  Dr Corinna Capra - gyn   . History of abnormal cervical Pap smear 1997   dysplasia- LEEP  . History of reconstructive repair of rectocele   . IBS (irritable bowel syndrome)   . Joint pain   . Lower extremity edema   . Migraine   . Ovarian cyst 1994  . Psoriasis 02/16/2017   -sees dermatologist  . Psoriatic arthritis [facility])     Past Surgical History:  Procedure Laterality Date  . BLADDER SUSPENSION N/A 04/08/2020   Procedure: POSSIBLE TRANSVAGINAL TAPE (TVT) PROCEDURE MIDURETHAL SLING;  Surgeon: Nunzio Cobbs, MD;  [facility];  Service: Gynecology;  Laterality: N/A;  . CERVICAL BIOPSY  W/ LOOP ELECTRODE EXCISION    . CESAREAN SECTION  2007 & 2010  . CYSTOSCOPY N/A 04/08/2020   Procedure: CYSTOSCOPY;  Surgeon: Nunzio Cobbs,  MD;  Location[facility];  Service: Gynecology;  Laterality: N/A;  . KNEE ARTHROPLASTY Right 1989  . OVARIAN CYST SURGERY  1994  . RECTOCELE REPAIR N/A 04/08/2020   Procedure: POSTERIOR REPAIR (RECTOCELE) WITH NATIVE TISSUE REPAIR;  Surgeon: Nunzio Cobbs, MD;  Location: [facility];  Service: Gynecology;  Laterality: N/A;  . SINUS ENDO WITH FUSION    . TOTAL LAPAROSCOPIC HYSTERECTOMY WITH SALPINGECTOMY N/A 04/08/2020   Procedure: TOTAL LAPAROSCOPIC HYSTERECTOMY WITH BILATERAL SALPINGECTOMY;  Surgeon: Megan Salon, MD;  Location: [facility];  Service: Gynecology;  Laterality: N/A;  . TUBAL LIGATION  2010  . VULVA /PERINEUM BIOPSY  01/2015    There were no vitals filed for this visit.    Subjective Assessment - 07/10/20 0933    Subjective Pt states she had leakage 2x and wants to make sure everything is strenthened. Also has had some itchiness.  Denies urgency or incontinence other than those 2x when she had full bladder and had coughed a lot when had bronchitis.  Both times were a small drop.  Denies straining for BM but states she is sitting for a while in the bathroom for reading.    Patient Stated Goals learn exercises    Currently in Pain? No/denies  Wanda Plump     [facility] PT Assessment - 07/21/20 0001      Assessment   Medical Diagnosis R10.2 (ICD-10-CM) - Pelvic pain;N81.6 (ICD-10-CM) - Rectocele    Referring Provider (PT) Janeann Merl, MD    Onset Date/Surgical Date 04/08/20    Prior Therapy No      Precautions   Precautions None      Balance Screen   Has the patient fallen in the past 6 months No      [place] residence    Living Arrangements Spouse/significant other;Children   2 teenage 11/13 yo sons     Prior Function   Level of Independence Independent    Vocation Unemployed    Leisure gardening      Cognition   Overall Cognitive Status Within Functional Limits for  tasks assessed      Observation/Other Assessments   Observations holding breathing with sit to stand and when contracting pelvic floor      Posture/Postural Control   Posture/Postural Control Postural limitations    Postural Limitations Increased lumbar lordosis      ROM / Strength   AROM / PROM / Strength AROM;Strength;PROM      AROM   Overall AROM Comments normal      PROM   Overall PROM Comments normal      Strength   Overall Strength Comments hip abuction 4/5 everything else 5/5      Palpation   Palpation comment tight lumbar paraspinals      Special Tests   Other special tests ASLR - negative      Ambulation/Gait   Gait Pattern Within Functional Limits                      Objective measurements completed on examination: See above findings.     Pelvic Floor Special Questions - 07/21/20 0001    Prior Pelvic/Prostate Exam Yes    Are you Pregnant or attempting pregnancy? No    Prior Pregnancies Yes    Number of C-Sections 2    Any difficulty with labor and deliveries Yes   pushed a lot with first one   Currently Sexually Active Yes    Is this Painful No    Urinary Leakage Yes    How often very rare    Activities that cause leaking Coughing    Urinary frequency normal    Fluid intake try to drink 64oz H2O; 2 cups coffee; diet soda    Pelvic Floor Internal Exam pt identity confirmed and informed consent given to perform internal assessment    Exam Type Vaginal    Palpation normal    Strength weak squeeze, no lift    Strength # of reps --   7 in 10 sec   Strength # of seconds 5    Tone normal            [facility] Adult PT Treatment/Exercise - 07/21/20 0001      Self-Care   Self-Care Other Self-Care Comments    Other Self-Care Comments  moisturizers; toileting                   PT Education - 07/10/20 1009    Education Details Access Code: [email or web address]    Person(s) Educated Patient    Methods Explanation;Demonstration;Verbal  cues;Handout    Comprehension Verbalized understanding;Returned demonstration            PT Short Term Goals - 07/21/20  [place] #1   Title ind with intial HEP    Time 4    Period Weeks    Status New    Target Date 08/07/20             PT Long Term Goals - 07/21/20 1618      PT LONG TERM GOAL #1   Title ind with advanced HEP    Time 8    Period Weeks    Status New    Target Date 09/04/20      PT LONG TERM GOAL #2   Title No pain or sensativity in perineum    Time 8    Period Weeks    Status New    Target Date 09/04/20      PT LONG TERM GOAL #3   Title Pt will be able to sustain pelvic floor contraction for at least 15 sec for improved functoin and decreased incontinence    Time 8    Period Weeks    Target Date 09/04/20                  Plan - 07/21/20 1555    Clinical Impression Statement Pt presents to skilled PT due to hysterectomy on 04/08/20 and has since had leakage 2x.  Pt states this was very rare and bladder was full but she would like to ensure that she is doing everything she can to strengthen and avoid progression.  Pt has pelvic floor weakness and dryness at noted.  She has LE weakness.  Pt will benefit form learning exercises and learning to better coordinate for greater core and bladder control. Pt will benefit from skilled PT to avoid any future issues or worsening of leakage.    Personal Factors and Comorbidities Comorbidity 2    Comorbidities hysterectomy, bladder sling    Examination-Activity Limitations Continence    Examination-Participation Restrictions Interpersonal Relationship    Stability/Clinical Decision Making Stable/Uncomplicated    Clinical Decision Making Low    Rehab Potential Excellent    PT Frequency 2x / week    PT Duration 8 weeks    PT Treatment/Interventions ADLs/Self Care Home Management;Biofeedback;Cryotherapy;Electrical Stimulation;Neuromuscular re-education;Therapeutic exercise;Therapeutic  activities;Patient/family education;Taping;Manual techniques;Dry needling;Passive range of motion    PT Next Visit Plan biofeedback if needed; breathing and core contraction and initial exercises    PT Home Exercise Plan [email or web address]    Consulted and Agree with Plan of Care Patient           Patient will benefit from skilled therapeutic intervention in order to improve the following deficits and impairments:  Decreased coordination, Pain, Postural dysfunction, Decreased strength, Increased muscle spasms, Impaired flexibility  Visit Diagnosis: Muscle weakness (generalized)  Other lack of coordination  Stress incontinence (female) (female)     Problem List Patient Active Problem List   Diagnosis Date Noted  . Trochanteric bursitis of left hip 01/12/2020  . Patellofemoral arthritis of right knee 05/11/2018  . Lumbar radiculopathy 03/22/2018  . Nonallopathic lesion of thoracic region 10/07/2017  . Nonallopathic lesion of cervical region 10/07/2017  . Nonallopathic lesion of lumbosacral region 10/07/2017  . Piriformis syndrome of right side 08/24/2017  . Polyarthropathy or polyarthritis of multiple sites 08/24/2017  . 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 Mild episode of recurrent major depressive disorder ([place]) 02/16/2017  . Attention deficit hyperactivity disorder (ADHD) 02/16/2017  . Psoriasis 02/16/2017  . Heterozygous factor V Leiden mutation ([place]) 09/01/2016  .  Nephrolithiasis 06/07/2012  . Headache, menstrual migraine 06/07/2012  . Malignant melanoma ([place]) 06/07/2012    Jule Ser, PT 07/21/2020, 4:21 PM   [facility] [address], [place], [place], [address] Phone: [phone]   Fax:  [phone]  Name: Chastidy Ranker MRN: [id number] Date of Birth: [birth year]

## 2020-07-21 NOTE — Addendum Note (Signed)
Addended by: Su Hoff on: 07/21/2020 04:23 PM   Modules accepted: Orders

## 2020-07-22 ENCOUNTER — Ambulatory Visit: Payer: BC Managed Care – PPO | Admitting: Physical Therapy

## 2020-07-22 ENCOUNTER — Other Ambulatory Visit: Payer: Self-pay

## 2020-07-22 DIAGNOSIS — N393 Stress incontinence (female) (male): Secondary | ICD-10-CM | POA: Diagnosis not present

## 2020-07-22 DIAGNOSIS — M6281 Muscle weakness (generalized): Secondary | ICD-10-CM

## 2020-07-22 DIAGNOSIS — R278 Other lack of coordination: Secondary | ICD-10-CM

## 2020-07-22 NOTE — Therapy (Signed)
[facility] [address] [place], [place], [address] Phone: [phone]   Fax:  [phone]  Physical Therapy Treatment  Patient Details  Name: Autumn [name] MRN: [id number] Date of Birth: 02-11-1976 Referring Provider (PT): Janeann Merl, MD   Encounter Date: 07/22/2020   PT End of Session - 07/22/20 1150    Visit Number 2    Date for PT Re-Evaluation 09/04/20    PT Start Time 1101    PT Stop Time 1140    PT Time Calculation (min) 39 min    Activity Tolerance Patient tolerated treatment well           Past Medical History:  Diagnosis Date   Anemia    Anxiety    Attention deficit hyperactivity disorder (ADHD) 02/16/2017   Sees [facility] for managment   Back pain    Calculus of kidney 06/07/2012   Overview:  Dr Karsten Ro - urology    Cancer [facility])    melanoma removed from abdomen in 5397   Complication of anesthesia    low BP during C-section   Depression    Factor V Leiden carrier [facility])    heterozygous   Hernia, rectovaginal 06/07/2012   Overview:  Dr Corinna Capra - gyn    History of abnormal cervical Pap smear 1997   dysplasia- LEEP   History of reconstructive repair of rectocele    IBS (irritable bowel syndrome)    Joint pain    Lower extremity edema    Migraine    Ovarian cyst 1994   Psoriasis 02/16/2017   -sees dermatologist   Psoriatic arthritis [facility])     Past Surgical History:  Procedure Laterality Date   BLADDER SUSPENSION N/A 04/08/2020   Procedure: POSSIBLE TRANSVAGINAL TAPE (TVT) PROCEDURE MIDURETHAL SLING;  Surgeon: Nunzio Cobbs, MD;  Location: [place];  Service: Gynecology;  Laterality: N/A;   CERVICAL BIOPSY  W/ LOOP ELECTRODE EXCISION     CESAREAN SECTION  2007 & 2010   CYSTOSCOPY N/A 04/08/2020   Procedure: CYSTOSCOPY;  Surgeon: Nunzio Cobbs, MD;  Location: [facility];  Service:  Gynecology;  Laterality: N/A;   KNEE ARTHROPLASTY Right [place] N/A 04/08/2020   Procedure: POSTERIOR REPAIR (RECTOCELE) WITH NATIVE TISSUE REPAIR;  Surgeon: Nunzio Cobbs, MD;  Location[facility];  Service: Gynecology;  Laterality: N/A;   SINUS ENDO WITH FUSION     TOTAL LAPAROSCOPIC HYSTERECTOMY WITH SALPINGECTOMY N/A 04/08/2020   Procedure: TOTAL LAPAROSCOPIC HYSTERECTOMY WITH BILATERAL SALPINGECTOMY;  Surgeon: Megan Salon, MD;  Location[facility];  Service: Gynecology;  Laterality: N/A;   TUBAL LIGATION  2010   VULVA /PERINEUM BIOPSY  01/2015    There were no vitals filed for this visit.   Subjective Assessment - 07/22/20 1247    Subjective intercourse is uncomfortable and wondering about that. Pt states she did exercises a little but not as much as wanted since she was on vacation.    Currently in Pain? No/denies                             [facility] Adult PT Treatment/Exercise - 07/22/20 0001      Self-Care   Other Self-Care Comments  intercourse positions for reduced penetration; lubricants; oh nut      Exercises  Exercises Lumbar      Lumbar Exercises: Stretches   Lower Trunk Rotation 30 seconds    Other Lumbar Stretch Exercise child pose and thoracic rotation - 3 x 10 sec      Lumbar Exercises: Quadruped   Other Quadruped Lumbar Exercises modified on table fwd reaching and rocking - standing still had best response - 10x 3 sec hold                    PT Short Term Goals - 07/21/20 1618      PT SHORT TERM GOAL #1   Title ind with intial HEP    Time 4    Period Weeks    Status New    Target Date 08/07/20             PT Long Term Goals - 07/21/20 1618      PT LONG TERM GOAL #1   Title ind with advanced HEP    Time 8    Period Weeks    Status New    Target Date 09/04/20      PT LONG TERM GOAL #2   Title No pain or sensativity in  perineum    Time 8    Period Weeks    Status New    Target Date 09/04/20      PT LONG TERM GOAL #3   Title Pt will be able to sustain pelvic floor contraction for at least 15 sec for improved functoin and decreased incontinence    Time 8    Period Weeks    Target Date 09/04/20                 Plan - 07/22/20 1148    Clinical Impression Statement Pt was able to progress strengthening and understand how to engage pelvic floor with TC. Pt was given education and info about how to reduce discomfort during intercourse. Pt will benefi tfrom skilled PT to ensure she can improve strength for reduced risk of POP.    PT Treatment/Interventions ADLs/Self Care Home Management;Biofeedback;Cryotherapy;Electrical Stimulation;Neuromuscular re-education;Therapeutic exercise;Therapeutic activities;Patient/family education;Taping;Manual techniques;Dry needling;Passive range of motion    PT Next Visit Plan give handout for dilators if needed    PT Home Exercise Plan [email or web address]    Consulted and Agree with Plan of Care Patient           Patient will benefit from skilled therapeutic intervention in order to improve the following deficits and impairments:  Decreased coordination, Pain, Postural dysfunction, Decreased strength, Increased muscle spasms, Impaired flexibility  Visit Diagnosis: Muscle weakness (generalized)  Other lack of coordination  Stress incontinence (female) (female)     Problem List Patient Active Problem List   Diagnosis Date Noted   Trochanteric bursitis of left hip 01/12/2020   Patellofemoral arthritis of right knee 05/11/2018   Lumbar radiculopathy 03/22/2018   Nonallopathic lesion of thoracic region 10/07/2017   Nonallopathic lesion of cervical region 10/07/2017   Nonallopathic lesion of lumbosacral region 10/07/2017   Piriformis syndrome of right side 08/24/2017   Polyarthropathy or polyarthritis of multiple sites 08/24/2017   Injury of digital nerve of  left ring finger 08/02/2017   Mild episode of recurrent major depressive disorder ([place]) 02/16/2017   Attention deficit hyperactivity disorder (ADHD) 02/16/2017   Psoriasis 02/16/2017   Heterozygous factor V Leiden mutation ([place]) 09/01/2016   Nephrolithiasis 06/07/2012   Headache, menstrual migraine 06/07/2012   Malignant melanoma ([place]) 06/07/2012    Camillo Flaming Dakwon Wenberg, PT 07/22/2020, 12:48  PM  [facility] [address] [place], [place], [address] Phone: [phone]   Fax:  [phone]  Name: Autumn [name] MRN: [id number] Date of Birth: 1976/08/30

## 2020-07-23 ENCOUNTER — Ambulatory Visit (INDEPENDENT_AMBULATORY_CARE_PROVIDER_SITE_OTHER): Payer: BC Managed Care – PPO | Admitting: Family Medicine

## 2020-07-23 ENCOUNTER — Encounter (INDEPENDENT_AMBULATORY_CARE_PROVIDER_SITE_OTHER): Payer: Self-pay | Admitting: Family Medicine

## 2020-07-23 ENCOUNTER — Other Ambulatory Visit: Payer: Self-pay

## 2020-07-23 VITALS — BP 96/64 | HR 82 | Temp 98.1°F | Ht 62.0 in | Wt 186.0 lb

## 2020-07-23 DIAGNOSIS — Z9189 Other specified personal risk factors, not elsewhere classified: Secondary | ICD-10-CM | POA: Diagnosis not present

## 2020-07-23 DIAGNOSIS — Z6834 Body mass index (BMI) 34.0-34.9, adult: Secondary | ICD-10-CM | POA: Diagnosis not present

## 2020-07-23 DIAGNOSIS — F419 Anxiety disorder, unspecified: Secondary | ICD-10-CM

## 2020-07-23 DIAGNOSIS — E669 Obesity, unspecified: Secondary | ICD-10-CM

## 2020-07-24 MED ORDER — BUPROPION HCL 75 MG PO TABS: 75.0000 mg | ORAL_TABLET | Freq: Every day | ORAL | 0 refills | Status: DC

## 2020-07-24 NOTE — Progress Notes (Addendum)
Chief Complaint:   OBESITY Autumn [name] is here to discuss her progress with her obesity treatment plan along with follow-up of her obesity related diagnoses. Autumn [name] is on the Category 2 Plan and states she is following her eating plan approximately 50% of the time. Autumn [name] states she is exercising for 0 minutes 0 times per week.  Today's visit was #: 2 Starting weight: 187 lbs Starting date: 07/09/2020 Today's weight: 186 lbs Today's date: 07/23/2020 Total lbs lost to date: 1 lb Total lbs lost since last in-office visit: 1 lb  Interim History: Autumn [name] has had a birthday and a vacation since her last visit.  She says she tried to be mindful while eating during this time.  She says she has had increased anxiety.  Subjective:   1. Anxiety, with emotional eating Autumn [name] has anxiety along with lack of motivation and inattentiveness. She has been reading about several medications: pristiq, trintellix, fetzima, and wellbutrin. She would like to avoid an obesogenic medication.   2. At risk for adverse drug reaction Autumn [name] is at risk for an adverse drug reaction (irritability, insomnia) to Wellbutrin.  Assessment/Plan:   1. Anxiety, with emotional eating After discussion, patient would like to start below medication. Expectations, risks, and potential side effects reviewed.   - buPROPion (WELLBUTRIN) 75 MG tablet; Take 1 tablet (75 mg total) by mouth daily.  Dispense: 30 tablet; Refill: 0  2. At risk for adverse drug reaction Autumn [name] was given approximately 15 minutes of drug side effect counseling today.  We discussed side effect possibility and risk versus benefits. Autumn [name] agreed to the medication and will contact this office if these side effects are intolerable.  Repetitive spaced learning was employed today to elicit superior memory formation and behavioral change.  3. Class 1 obesity with serious comorbidity and body mass index (BMI) of 34.0 to 34.9 in adult, unspecified obesity  type Autumn [name] is currently in the action stage of change. As such, her goal is to continue with weight loss efforts. She has agreed to the Category 2 Plan.   Exercise goals: For substantial health benefits, adults should do at least 150 minutes (2 hours and 30 minutes) a week of moderate-intensity, or 75 minutes (1 hour and 15 minutes) a week of vigorous-intensity aerobic physical activity, or an equivalent combination of moderate- and vigorous-intensity aerobic activity. Aerobic activity should be performed in episodes of at least 10 minutes, and preferably, it should be spread throughout the week.  Behavioral modification strategies: increasing lean protein intake, decreasing simple carbohydrates, increasing vegetables and increasing water intake.  Autumn [name] has agreed to follow-up with our clinic in 2-3 weeks. She was informed of the importance of frequent follow-up visits to maximize her success with intensive lifestyle modifications for her multiple health conditions.   Objective:   Blood pressure 96/64, pulse 82, temperature 98.1 F (36.7 C), temperature source Oral, height 5\' 2"  (1.575 m), weight 186 lb (84.4 kg), last menstrual period 03/20/2020, SpO2 99 %. Body mass index is 34.02 kg/m.  General: Cooperative, alert, well developed, in no acute distress. HEENT: Conjunctivae and lids unremarkable. Cardiovascular: Regular rhythm.  Lungs: Normal work of breathing. Neurologic: No focal deficits.   Lab Results  Component Value Date   CREATININE 0.61 03/28/2020   BUN 14 03/28/2020   NA 139 03/28/2020   K 4.5 03/28/2020   CL 107 03/28/2020   CO2 29 03/28/2020   Lab Results  Component Value Date   ALT 17 08/24/2017   AST 17 08/24/2017  ALKPHOS 56 08/24/2017   BILITOT 0.8 08/24/2017   Lab Results  Component Value Date   TSH 2.30 08/24/2017   Lab Results  Component Value Date   WBC 9.8 04/15/2020   HGB 12.3 04/15/2020   HCT 36.6 04/15/2020   MCV 87 04/15/2020   PLT 219  04/15/2020   Lab Results  Component Value Date   IRON 40 02/15/2020   TIBC 317 02/15/2020   FERRITIN 9 (L) 02/15/2020   Attestation Statements:   Reviewed by clinician on day of visit: allergies, medications, problem list, medical history, surgical history, family history, social history, and previous encounter notes.  I, Water quality scientist, CMA, am acting as transcriptionist for Briscoe Deutscher, DO  I have reviewed the above documentation for accuracy and completeness, and I agree with the above. Briscoe Deutscher, DO

## 2020-07-25 ENCOUNTER — Ambulatory Visit: Payer: BC Managed Care – PPO | Admitting: Physical Therapy

## 2020-07-25 ENCOUNTER — Other Ambulatory Visit: Payer: Self-pay

## 2020-07-25 ENCOUNTER — Encounter (INDEPENDENT_AMBULATORY_CARE_PROVIDER_SITE_OTHER): Payer: Self-pay | Admitting: Family Medicine

## 2020-07-25 DIAGNOSIS — Z800 Family history of malignant neoplasm of digestive organs: Secondary | ICD-10-CM | POA: Diagnosis not present

## 2020-07-25 DIAGNOSIS — Z1211 Encounter for screening for malignant neoplasm of colon: Secondary | ICD-10-CM | POA: Diagnosis not present

## 2020-07-25 DIAGNOSIS — M6281 Muscle weakness (generalized): Secondary | ICD-10-CM

## 2020-07-25 DIAGNOSIS — K625 Hemorrhage of anus and rectum: Secondary | ICD-10-CM | POA: Diagnosis not present

## 2020-07-25 DIAGNOSIS — N393 Stress incontinence (female) (male): Secondary | ICD-10-CM | POA: Diagnosis not present

## 2020-07-25 DIAGNOSIS — R278 Other lack of coordination: Secondary | ICD-10-CM | POA: Diagnosis not present

## 2020-07-25 DIAGNOSIS — R635 Abnormal weight gain: Secondary | ICD-10-CM | POA: Diagnosis not present

## 2020-07-25 MED ORDER — LISDEXAMFETAMINE DIMESYLATE 20 MG PO CAPS: 20.0000 mg | ORAL_CAPSULE | Freq: Every day | ORAL | 0 refills | Status: DC

## 2020-07-25 NOTE — Therapy (Signed)
[facility] [address] [place], [place], [address] Phone: [phone]   Fax:  [phone]  Physical Therapy Treatment  Patient Details  Name: Autumn [name] MRN: [id number] Date of Birth: 05/12/1976 Referring Provider (PT): Janeann Merl, MD   Encounter Date: 07/25/2020   PT End of Session - 07/25/20 1145    Visit Number 3    Date for PT Re-Evaluation 09/04/20    PT Start Time 1014    PT Stop Time 1058    PT Time Calculation (min) 44 min    Activity Tolerance Patient tolerated treatment well    Behavior During Therapy [facility] for tasks assessed/performed           Past Medical History:  Diagnosis Date  . Anemia   . Anxiety   . Attention deficit hyperactivity disorder (ADHD) 02/16/2017   Sees [facility] for managment  . Back pain   . Calculus of kidney 06/07/2012   Overview:  Dr Karsten Ro - urology   . Cancer ([place])    melanoma removed from abdomen in 2000  . Complication of anesthesia    low BP during C-section  . Depression   . Factor V Leiden carrier ([place])    heterozygous  . Hernia, rectovaginal 06/07/2012   Overview:  Dr Corinna Capra - gyn   . History of abnormal cervical Pap smear 1997   dysplasia- LEEP  . History of reconstructive repair of rectocele   . IBS (irritable bowel syndrome)   . Joint pain   . Lower extremity edema   . Migraine   . Ovarian cyst 1994  . Psoriasis 02/16/2017   -sees dermatologist  . Psoriatic arthritis [facility])     Past Surgical History:  Procedure Laterality Date  . BLADDER SUSPENSION N/A 04/08/2020   Procedure: POSSIBLE TRANSVAGINAL TAPE (TVT) PROCEDURE MIDURETHAL SLING;  Surgeon: Nunzio Cobbs, MD;  Location: [facility];  Service: Gynecology;  Laterality: N/A;  . CERVICAL BIOPSY  W/ LOOP ELECTRODE EXCISION    . CESAREAN SECTION  2007 & 2010  . CYSTOSCOPY N/A 04/08/2020   Procedure: CYSTOSCOPY;  Surgeon: Nunzio Cobbs,  MD;  Location: [facility];  Service: Gynecology;  Laterality: N/A;  . KNEE ARTHROPLASTY Right 1989  . OVARIAN CYST SURGERY  1994  . RECTOCELE REPAIR N/A 04/08/2020   Procedure: POSTERIOR REPAIR (RECTOCELE) WITH NATIVE TISSUE REPAIR;  Surgeon: Nunzio Cobbs, MD;  Location: [facility];  Service: Gynecology;  Laterality: N/A;  . SINUS ENDO WITH FUSION    . TOTAL LAPAROSCOPIC HYSTERECTOMY WITH SALPINGECTOMY N/A 04/08/2020   Procedure: TOTAL LAPAROSCOPIC HYSTERECTOMY WITH BILATERAL SALPINGECTOMY;  Surgeon: Megan Salon, MD;  Location: [facility];  Service: Gynecology;  Laterality: N/A;  . TUBAL LIGATION  2010  . VULVA /PERINEUM BIOPSY  01/2015    There were no vitals filed for this visit.   Subjective Assessment - 07/25/20 1146    Subjective No pain or discomfort.  I think things are going well and it is just a matter of doing the exercises    Patient Stated Goals learn exercises    Currently in Pain? No/denies                             [facility] Adult PT Treatment/Exercise - 07/25/20 0001      Lumbar Exercises: Aerobic   Nustep L2 x  7 min PT present for status update      Lumbar Exercises: Standing   Other Standing Lumbar Exercises marching, hip ext, hip abduction - standing and leaning - 15x each way      Lumbar Exercises: Seated   Other Seated Lumbar Exercises ball squeeze and kegel without ball - 10x each                    PT Short Term Goals - 07/21/20 1618      PT SHORT TERM GOAL #1   Title ind with intial HEP    Time 4    Period Weeks    Status New    Target Date 08/07/20             PT Long Term Goals - 07/21/20 1618      PT LONG TERM GOAL #1   Title ind with advanced HEP    Time 8    Period Weeks    Status New    Target Date 09/04/20      PT LONG TERM GOAL #2   Title No pain or sensativity in perineum    Time 8    Period Weeks    Status New    Target Date 09/04/20       PT LONG TERM GOAL #3   Title Pt will be able to sustain pelvic floor contraction for at least 15 sec for improved functoin and decreased incontinence    Time 8    Period Weeks    Target Date 09/04/20                 Plan - 07/25/20 1147    Clinical Impression Statement Pt is doing better.  No pain.  Pt will cancel next week so she can work on exercises.  She did well with biofeedback. Pt will bneefi tfrom skilled PT to work on increased strength for improved pelvic support during functional activities.    PT Treatment/Interventions ADLs/Self Care Home Management;Biofeedback;Cryotherapy;Electrical Stimulation;Neuromuscular re-education;Therapeutic exercise;Therapeutic activities;Patient/family education;Taping;Manual techniques;Dry needling;Passive range of motion    PT Next Visit Plan f/u on HEP and progress    PT Home Exercise Plan [id number]    Consulted and Agree with Plan of Care Patient           Patient will benefit from skilled therapeutic intervention in order to improve the following deficits and impairments:  Decreased coordination, Pain, Postural dysfunction, Decreased strength, Increased muscle spasms, Impaired flexibility  Visit Diagnosis: Muscle weakness (generalized)  Other lack of coordination  Stress incontinence (female) (female)     Problem List Patient Active Problem List   Diagnosis Date Noted  . Trochanteric bursitis of left hip 01/12/2020  . Patellofemoral arthritis of right knee 05/11/2018  . Lumbar radiculopathy 03/22/2018  . Nonallopathic lesion of thoracic region 10/07/2017  . Nonallopathic lesion of cervical region 10/07/2017  . Nonallopathic lesion of lumbosacral region 10/07/2017  . Piriformis syndrome of right side 08/24/2017  . Polyarthropathy or polyarthritis of multiple sites 08/24/2017  . 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 Mild episode of recurrent major depressive disorder ([place]) 02/16/2017  . Attention  deficit hyperactivity disorder (ADHD) 02/16/2017  . Psoriasis 02/16/2017  . Heterozygous factor V Leiden mutation ([place]) 09/01/2016  . Nephrolithiasis 06/07/2012  . Headache, menstrual migraine 06/07/2012  . Malignant melanoma ([place]) 06/07/2012    Jule Ser, PT 07/25/2020, 11:49 AM  [facility] [address],  [place], [place], [address] Phone: [phone]   Fax:  [phone]  Name: Annsleigh Dragoo MRN: [id number] Date of Birth: 1976/03/03

## 2020-07-29 ENCOUNTER — Encounter: Payer: BC Managed Care – PPO | Admitting: Physical Therapy

## 2020-07-31 ENCOUNTER — Telehealth: Payer: Self-pay | Admitting: Obstetrics & Gynecology

## 2020-07-31 NOTE — Telephone Encounter (Signed)
AEX 05/2019 S/p TLH/bilateral salpingectomy, posterior repair, TVT 04/08/20  Spoke with pt. Pt states having painful intercourse x 2 and vaginal dryness since released from surgery. Pt describes as being "chaffed" externally and having pain with intercourse internally. Pt states has been going to pelvic PT and was given samples to use for lubrication. Pt states helps, but not resolves. Pt requesting to be seen to discuss vaginal dryness and what to use. Pt unsure what to use for OTC products.Denies any vaginal bleeding or spotting.    Advised pt can have OV. Pt scheduled with Dr Sabra Heck on 08/01/20 at 3 pm. Pt agreeable and verbalized understanding of date and time of appt.  Encounter closed.

## 2020-07-31 NOTE — Telephone Encounter (Signed)
Patient is having pain with intercourse.

## 2020-08-01 ENCOUNTER — Encounter: Payer: BC Managed Care – PPO | Admitting: Physical Therapy

## 2020-08-01 NOTE — Progress Notes (Signed)
GYNECOLOGY  VISIT  CC:   Vaginal dryness  HPI: 44 y.o. G28P2002 Married White or Caucasian female here for vaginal dryness post surgery.  She is doing physical therapy at this point after her TLH/bilateral salpingectomy, posterior repair, TVT.  She is having painful insertion.  She also has an irritated area on the skin that she would like me to evaluate.  Feels better today but seems to get little cuts in it that hurt.  Denies vaginal bleeding or vaginal discharge.  GYNECOLOGIC HISTORY: Patient's last menstrual period was 03/20/2020. Contraception: hysterectomy Menopausal hormone therapy: none  Patient Active Problem List   Diagnosis Date Noted  . Trochanteric bursitis of left hip 01/12/2020  . Patellofemoral arthritis of right knee 05/11/2018  . Lumbar radiculopathy 03/22/2018  . Nonallopathic lesion of thoracic region 10/07/2017  . Nonallopathic lesion of cervical region 10/07/2017  . Nonallopathic lesion of lumbosacral region 10/07/2017  . Piriformis syndrome of right side 08/24/2017  . Polyarthropathy or polyarthritis of multiple sites 08/24/2017  . 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 Mild episode of recurrent major depressive disorder ([place]) 02/16/2017  . Attention deficit hyperactivity disorder (ADHD) 02/16/2017  . Psoriasis 02/16/2017  . Heterozygous factor V Leiden mutation ([place]) 09/01/2016  . Nephrolithiasis 06/07/2012  . Headache, menstrual migraine 06/07/2012  . Malignant melanoma ([place]) 06/07/2012    Past Medical History:  Diagnosis Date  . Anemia   . Anxiety   . Attention deficit hyperactivity disorder (ADHD) 02/16/2017   Sees [facility] for managment  . Back pain   . Calculus of kidney 06/07/2012   Overview:  Dr Karsten Ro - urology   . Cancer ([place])    melanoma removed from abdomen in 2000  . Complication of anesthesia    low BP during C-section  . Depression   . Factor V Leiden carrier ([place])    heterozygous  . Hernia,  rectovaginal 06/07/2012   Overview:  Dr Corinna Capra - gyn   . History of abnormal cervical Pap smear 1997   dysplasia- LEEP  . History of reconstructive repair of rectocele   . IBS (irritable bowel syndrome)   . Joint pain   . Lower extremity edema   . Migraine   . Ovarian cyst 1994  . Psoriasis 02/16/2017   -sees dermatologist  . Psoriatic arthritis [facility])     Past Surgical History:  Procedure Laterality Date  . BLADDER SUSPENSION N/A 04/08/2020   Procedure: POSSIBLE TRANSVAGINAL TAPE (TVT) PROCEDURE MIDURETHAL SLING;  Surgeon: Nunzio Cobbs, MD;  Location: [facility];  Service: Gynecology;  Laterality: N/A;  . CERVICAL BIOPSY  W/ LOOP ELECTRODE EXCISION    . CESAREAN SECTION  2007 & 2010  . CYSTOSCOPY N/A 04/08/2020   Procedure: CYSTOSCOPY;  Surgeon: Nunzio Cobbs, MD;  Location: [facility];  Service: Gynecology;  Laterality: N/A;  . KNEE ARTHROPLASTY Right 1989  . OVARIAN CYST SURGERY  1994  . RECTOCELE REPAIR N/A 04/08/2020   Procedure: POSTERIOR REPAIR (RECTOCELE) WITH NATIVE TISSUE REPAIR;  Surgeon: Nunzio Cobbs, MD;  Location: [facility];  Service: Gynecology;  Laterality: N/A;  . SINUS ENDO WITH FUSION    . TOTAL LAPAROSCOPIC HYSTERECTOMY WITH SALPINGECTOMY N/A 04/08/2020   Procedure: TOTAL LAPAROSCOPIC HYSTERECTOMY WITH BILATERAL SALPINGECTOMY;  Surgeon: Megan Salon, MD;  Location: [facility];  Service: Gynecology;  Laterality: N/A;  . TUBAL LIGATION  2010  . VULVA /PERINEUM BIOPSY  01/2015    MEDS:   Current Outpatient Medications on File Prior to Visit  Medication Sig Dispense Refill  . lisdexamfetamine (VYVANSE) 20 MG capsule Take 1 capsule (20 mg total) by mouth daily. 30 capsule 0  . Multiple Vitamin (MULTIVITAMIN WITH MINERALS) TABS tablet Take 1 tablet by mouth daily.    Marland Kitchen OVER THE COUNTER MEDICATION CBD Gummies PRN sleep    . Secukinumab (COSENTYX SENSOREADY PEN) 150 MG/ML SOAJ  Inject 300 mg into the skin every 30 (thirty) days.     Marland Kitchen UNABLE TO FIND julva external vaginal moisturizer    . CLENPIQ 10-3.5-12 MG-GM -GM/160ML SOLN Take by mouth. (Patient not taking: Reported on 08/02/2020)    . olopatadine (PATADAY) 0.1 % ophthalmic solution Place 1 drop into both eyes daily. (Patient not taking: Reported on 08/02/2020)    . Ubrogepant (UBRELVY) 100 MG TABS Take 100 mg by mouth daily as needed (migraine).  (Patient not taking: Reported on 08/02/2020)     No current facility-administered medications on file prior to visit.    ALLERGIES: Patient has no known allergies.  Family History  Problem Relation Age of Onset  . Hyperlipidemia Mother   . Esophageal cancer Father   . Hyperlipidemia Father   . Cancer Father   . Esophageal cancer Maternal Uncle   . Rectal cancer Maternal Uncle   . Diabetes Maternal Grandmother   . Heart disease Maternal Grandmother     SH:  Married, non smoker  Review of Systems  Constitutional: Negative.   Genitourinary: Negative.   Psychiatric/Behavioral: Negative.     PHYSICAL EXAMINATION:    BP 118/64   Pulse 70   Resp 16   Wt 188 lb (85.3 kg)   LMP 03/20/2020   BMI 34.39 kg/m     General appearance: alert, cooperative and appears stated age Lymph:  no inguinal LAD noted  Pelvic: External genitalia:  no lesions              Urethra:  normal appearing urethra with no masses, tenderness or lesions              Bartholins and Skenes: normal                 Vagina: normal appearing vagina with normal color and discharge, no lesions              Cervix: absent              Bimanual Exam:  Uterus:  uterus absent              Adnexa: no mass, fullness, tenderness               Pt does have pain to palpation of the tissue at the introitus right where the end of the posterior repair sutures would have been present  Chaperone, Olene Floss, CMA, was present for exam.  Assessment: Vaginal dryness Painful insertion with  intercourse  Plan: Will start vaginal estrace cream 1 gram pv twice weekly.  Rx to pharmacy. Advised pt to massage tissue right where pain is noted on exam today and discuss with physical therapist and she may be able to make additional suggestions.  Consider recheck in about 2 months.   20 minutes spent with pt

## 2020-08-02 ENCOUNTER — Ambulatory Visit (INDEPENDENT_AMBULATORY_CARE_PROVIDER_SITE_OTHER): Payer: BC Managed Care – PPO | Admitting: Obstetrics & Gynecology

## 2020-08-02 ENCOUNTER — Encounter: Payer: Self-pay | Admitting: Obstetrics & Gynecology

## 2020-08-02 ENCOUNTER — Other Ambulatory Visit: Payer: Self-pay

## 2020-08-02 VITALS — BP 118/64 | HR 70 | Resp 16 | Wt 188.0 lb

## 2020-08-02 DIAGNOSIS — N9411 Superficial (introital) dyspareunia: Secondary | ICD-10-CM | POA: Diagnosis not present

## 2020-08-02 DIAGNOSIS — N898 Other specified noninflammatory disorders of vagina: Secondary | ICD-10-CM

## 2020-08-02 MED ORDER — ESTRADIOL 0.1 MG/GM VA CREA
TOPICAL_CREAM | VAGINAL | 1 refills | Status: DC
Start: 2020-08-02 — End: 2020-10-10

## 2020-08-05 ENCOUNTER — Other Ambulatory Visit: Payer: Self-pay

## 2020-08-05 ENCOUNTER — Ambulatory Visit (INDEPENDENT_AMBULATORY_CARE_PROVIDER_SITE_OTHER): Payer: BC Managed Care – PPO | Admitting: Family Medicine

## 2020-08-05 ENCOUNTER — Encounter (INDEPENDENT_AMBULATORY_CARE_PROVIDER_SITE_OTHER): Payer: Self-pay | Admitting: Family Medicine

## 2020-08-05 ENCOUNTER — Ambulatory Visit: Payer: BC Managed Care – PPO | Admitting: Physical Therapy

## 2020-08-05 ENCOUNTER — Encounter: Payer: Self-pay | Admitting: Physical Therapy

## 2020-08-05 VITALS — BP 117/75 | HR 88 | Temp 98.4°F | Ht 62.0 in | Wt 185.0 lb

## 2020-08-05 DIAGNOSIS — N959 Unspecified menopausal and perimenopausal disorder: Secondary | ICD-10-CM | POA: Diagnosis not present

## 2020-08-05 DIAGNOSIS — Z9189 Other specified personal risk factors, not elsewhere classified: Secondary | ICD-10-CM | POA: Diagnosis not present

## 2020-08-05 DIAGNOSIS — Z6833 Body mass index (BMI) 33.0-33.9, adult: Secondary | ICD-10-CM

## 2020-08-05 DIAGNOSIS — F419 Anxiety disorder, unspecified: Secondary | ICD-10-CM

## 2020-08-05 DIAGNOSIS — E669 Obesity, unspecified: Secondary | ICD-10-CM | POA: Diagnosis not present

## 2020-08-05 DIAGNOSIS — M6281 Muscle weakness (generalized): Secondary | ICD-10-CM

## 2020-08-05 DIAGNOSIS — N393 Stress incontinence (female) (male): Secondary | ICD-10-CM | POA: Diagnosis not present

## 2020-08-05 DIAGNOSIS — R278 Other lack of coordination: Secondary | ICD-10-CM

## 2020-08-05 NOTE — Therapy (Signed)
[facility] [address] [place], [place], [address] Phone: [phone]   Fax:  [phone]  Physical Therapy Treatment  Patient Details  Name: Autumn [name] MRN: [id number] Date of Birth: [birth year] Referring Provider (PT): Janeann Merl, MD   Encounter Date: 08/05/2020   PT End of Session - 08/05/20 1137    Visit Number 4    Date for PT Re-Evaluation 09/04/20    PT Start Time 1059    PT Stop Time 1137    PT Time Calculation (min) 38 min    Activity Tolerance Patient tolerated treatment well    Behavior During Therapy [facility] for tasks assessed/performed           Past Medical History:  Diagnosis Date  . Anemia   . Anxiety   . Attention deficit hyperactivity disorder (ADHD) 02/16/2017   Sees [facility] for managment  . Back pain   . Calculus of kidney 06/07/2012   Overview:  Dr Karsten Ro - urology   . Cancer ([place])    melanoma removed from abdomen in 2000  . Complication of anesthesia    low BP during C-section  . Depression   . Factor V Leiden carrier ([place])    heterozygous  . Hernia, rectovaginal 06/07/2012   Overview:  Dr Corinna Capra - gyn   . History of abnormal cervical Pap smear 1997   dysplasia- LEEP  . History of reconstructive repair of rectocele   . IBS (irritable bowel syndrome)   . Joint pain   . Lower extremity edema   . Migraine   . Ovarian cyst 1994  . Psoriasis 02/16/2017   -sees dermatologist  . Psoriatic arthritis [facility])     Past Surgical History:  Procedure Laterality Date  . BLADDER SUSPENSION N/A 04/08/2020   Procedure: POSSIBLE TRANSVAGINAL TAPE (TVT) PROCEDURE MIDURETHAL SLING;  Surgeon: Nunzio Cobbs, MD;  Location: [facility];  Service: Gynecology;  Laterality: N/A;  . CERVICAL BIOPSY  W/ LOOP ELECTRODE EXCISION    . CESAREAN SECTION  2007 & 2010  . CYSTOSCOPY N/A 04/08/2020   Procedure: CYSTOSCOPY;  Surgeon: Nunzio Cobbs,  MD;  Location: [facility];  Service: Gynecology;  Laterality: N/A;  . KNEE ARTHROPLASTY Right 1989  . OVARIAN CYST SURGERY  1994  . RECTOCELE REPAIR N/A 04/08/2020   Procedure: POSTERIOR REPAIR (RECTOCELE) WITH NATIVE TISSUE REPAIR;  Surgeon: Nunzio Cobbs, MD;  Location: [facility];  Service: Gynecology;  Laterality: N/A;  . SINUS ENDO WITH FUSION    . TOTAL LAPAROSCOPIC HYSTERECTOMY WITH SALPINGECTOMY N/A 04/08/2020   Procedure: TOTAL LAPAROSCOPIC HYSTERECTOMY WITH BILATERAL SALPINGECTOMY;  Surgeon: Megan Salon, MD;  Location: [facility];  Service: Gynecology;  Laterality: N/A;  . TUBAL LIGATION  2010  . VULVA /PERINEUM BIOPSY  01/2015    There were no vitals filed for this visit.   Subjective Assessment - 08/05/20 1104    Subjective I have been doing fine with the exercises but intercourse was the most painful it has been.  I am getting estrogen cream.    Currently in Pain? No/denies                             [facility] Adult PT Treatment/Exercise - 08/05/20 0001      Self-Care   Other Self-Care Comments  educated on self massage and using coconut  or vitamin e oils      Manual Therapy   Manual Therapy Internal Pelvic Floor    Manual therapy comments pt identity confirmed and internal STM done with informed consent    Internal Pelvic Floor levators and vaginal wall fascial release; scar tissue mobs around posterior vaginal canal                    PT Short Term Goals - 08/05/20 1107      PT SHORT TERM GOAL #1   Title ind with intial HEP    Status Achieved             PT Long Term Goals - 08/05/20 1106      PT LONG TERM GOAL #1   Title ind with advanced HEP    Status On-going      PT LONG TERM GOAL #2   Title No pain or sensativity in perineum    Baseline had gotten worse    Status On-going      PT LONG TERM GOAL #3   Title Pt will be able to sustain pelvic floor contraction for  at least 15 sec for improved functoin and decreased incontinence    Baseline no incontinence    Status On-going                 Plan - 08/05/20 1137    Clinical Impression Statement Pt has been doing well with exercises.  She had increased pain with intercours so PT focused on pain management techniques and soft tissue mobs to decreased tension and scar tissue adhesions.  Pt had good response from this treatment and was educated in how to progress self massage at home.  pt will benefit from skilled PT to continue to work on improved soft tissue length.    Comorbidities hysterectomy, bladder sling    Examination-Activity Limitations Continence    PT Treatment/Interventions ADLs/Self Care Home Management;Biofeedback;Cryotherapy;Electrical Stimulation;Neuromuscular re-education;Therapeutic exercise;Therapeutic activities;Patient/family education;Taping;Manual techniques;Dry needling;Passive range of motion    PT Next Visit Plan f/u on HEP, self massage and progress, f/u on stretches and lengthening, dilators?    PT Home Exercise Plan [id number]    Consulted and Agree with Plan of Care Patient           Patient will benefit from skilled therapeutic intervention in order to improve the following deficits and impairments:  Decreased coordination, Pain, Postural dysfunction, Decreased strength, Increased muscle spasms, Impaired flexibility  Visit Diagnosis: Muscle weakness (generalized)  Other lack of coordination  Stress incontinence (female) (female)     Problem List Patient Active Problem List   Diagnosis Date Noted  . Trochanteric bursitis of left hip 01/12/2020  . Patellofemoral arthritis of right knee 05/11/2018  . Lumbar radiculopathy 03/22/2018  . Nonallopathic lesion of thoracic region 10/07/2017  . Nonallopathic lesion of cervical region 10/07/2017  . Nonallopathic lesion of lumbosacral region 10/07/2017  . Piriformis syndrome of right side 08/24/2017  . Polyarthropathy  or polyarthritis of multiple sites 08/24/2017  . 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 Mild episode of recurrent major depressive disorder ([place]) 02/16/2017  . Attention deficit hyperactivity disorder (ADHD) 02/16/2017  . Psoriasis 02/16/2017  . Heterozygous factor V Leiden mutation ([place]) 09/01/2016  . Nephrolithiasis 06/07/2012  . Headache, menstrual migraine 06/07/2012  . Malignant melanoma ([place]) 06/07/2012    Jule Ser, PT 08/05/2020, 12:17 PM  [facility] [address], [place], [place], [address]  Phone: [phone]   Fax:  [phone]  Name: Deshonna Trnka MRN: [id number] Date of Birth: 23-Nov-1976

## 2020-08-06 NOTE — Progress Notes (Signed)
Chief Complaint:   OBESITY Autumn [name] is here to discuss her progress with her obesity treatment plan along with follow-up of her obesity related diagnoses. Autumn [name] is on the Category 2 Plan and states she is following her eating plan approximately 70% of the time. Autumn [name] states she has increased her activity level.  Today's visit was #: 3 Starting weight: 187 lbs Starting date: 07/09/2020 Today's weight: 185 lbs Today's date: 08/05/2020 Total lbs lost to date: 2 lbs Total lbs lost since last in-office visit: 1 lb  Interim History: Autumn [name] says that Vyvanse is helping to control her BED.  Denies side effects.  She is working to make [employer].    Assessment/Plan:   1. Postmenopausal symptoms Autumn [name] will discuss this with her GYN.  She has heterozygous Factor V Leiden.  2. Anxiety, with emotional eating With binging.  She will continue Vyvanse.  Behavior modification techniques were discussed today to help Autumn [name] deal with her anxiety.  Orders and follow up as documented in patient record.   3. At risk for constipation Autumn [name] is at risk for constipation due to increased protein in her diet and taking Vyvanse.  Autumn [name] was given approximately 15 minutes of counseling today regarding prevention of constipation. She was encouraged to increase water and fiber intake.   4. Class 1 obesity with serious comorbidity and body mass index (BMI) of 33.0 to 33.9 in adult, unspecified obesity type Autumn [name] is currently in the action stage of change. As such, her goal is to continue with weight loss efforts. She has agreed to the Category 2 Plan.   Exercise goals: For substantial health benefits, adults should do at least 150 minutes (2 hours and 30 minutes) a week of moderate-intensity, or 75 minutes (1 hour and 15 minutes) a week of vigorous-intensity aerobic physical activity, or an equivalent combination of moderate- and vigorous-intensity aerobic activity. Aerobic activity should be  performed in episodes of at least 10 minutes, and preferably, it should be spread throughout the week.  Behavioral modification strategies: increasing lean protein intake and increasing water intake.  Autumn [name] has agreed to follow-up with our clinic in 3 weeks. She was informed of the importance of frequent follow-up visits to maximize her success with intensive lifestyle modifications for her multiple health conditions.   Objective:   Blood pressure 117/75, pulse 88, temperature 98.4 F (36.9 C), temperature source Oral, height 5\' 2"  (1.575 m), weight 185 lb (83.9 kg), last menstrual period 03/20/2020, SpO2 98 %. Body mass index is 33.84 kg/m.  General: Cooperative, alert, well developed, in no acute distress. HEENT: Conjunctivae and lids unremarkable. Cardiovascular: Regular rhythm.  Lungs: Normal work of breathing. Neurologic: No focal deficits.   Lab Results  Component Value Date   CREATININE 0.61 03/28/2020   BUN 14 03/28/2020   NA 139 03/28/2020   K 4.5 03/28/2020   CL 107 03/28/2020   CO2 29 03/28/2020   Lab Results  Component Value Date   ALT 17 08/24/2017   AST 17 08/24/2017   ALKPHOS 56 08/24/2017   BILITOT 0.8 08/24/2017   Lab Results  Component Value Date   TSH 2.30 08/24/2017   Lab Results  Component Value Date   WBC 9.8 04/15/2020   HGB 12.3 04/15/2020   HCT 36.6 04/15/2020   MCV 87 04/15/2020   PLT 219 04/15/2020   Lab Results  Component Value Date   IRON 40 02/15/2020   TIBC 317 02/15/2020   FERRITIN 9 (L) 02/15/2020  Attestation Statements:   Reviewed by clinician on day of visit: allergies, medications, problem list, medical history, surgical history, family history, social history, and previous encounter notes.  I, Water quality scientist, CMA, am acting as transcriptionist for Briscoe Deutscher, DO  I have reviewed the above documentation for accuracy and completeness, and I agree with the above. Briscoe Deutscher, DO

## 2020-08-08 ENCOUNTER — Other Ambulatory Visit: Payer: Self-pay

## 2020-08-08 ENCOUNTER — Encounter: Payer: Self-pay | Admitting: Physical Therapy

## 2020-08-08 ENCOUNTER — Ambulatory Visit: Payer: BC Managed Care – PPO | Attending: Physical Medicine and Rehabilitation | Admitting: Physical Therapy

## 2020-08-08 DIAGNOSIS — N393 Stress incontinence (female) (male): Secondary | ICD-10-CM | POA: Diagnosis not present

## 2020-08-08 DIAGNOSIS — R278 Other lack of coordination: Secondary | ICD-10-CM | POA: Insufficient documentation

## 2020-08-08 DIAGNOSIS — M6281 Muscle weakness (generalized): Secondary | ICD-10-CM | POA: Insufficient documentation

## 2020-08-08 NOTE — Therapy (Signed)
[facility] [address], [place], [place], [address] Phone: [phone]   Fax:  [phone]  Physical Therapy Treatment  Patient Details  Name: Autumn [name] MRN: [id number] Date of Birth: 19-Jun-1976 Referring Provider (PT): Janeann Merl, MD   Encounter Date: 08/08/2020   PT End of Session - 08/08/20 1143    Visit Number 5    Date for PT Re-Evaluation 09/04/20    PT Start Time 1101    PT Stop Time 1142    PT Time Calculation (min) 41 min    Activity Tolerance Patient tolerated treatment well    Behavior During Therapy [facility] for tasks assessed/performed           Past Medical History:  Diagnosis Date  . Anemia   . Anxiety   . Attention deficit hyperactivity disorder (ADHD) 02/16/2017   Sees [place] [facility] for managment  . Back pain   . Calculus of kidney 06/07/2012   Overview:  Dr Karsten Ro - urology   . Cancer ([place])    melanoma removed from abdomen in 2000  . Complication of anesthesia    low BP during C-section  . Depression   . Factor V Leiden carrier ([place])    heterozygous  . Hernia, rectovaginal 06/07/2012   Overview:  Dr Corinna Capra - gyn   . History of abnormal cervical Pap smear 1997   dysplasia- LEEP  . History of reconstructive repair of rectocele   . IBS (irritable bowel syndrome)   . Joint pain   . Lower extremity edema   . Migraine   . Ovarian cyst 1994  . Psoriasis 02/16/2017   -sees dermatologist  . Psoriatic arthritis [facility])     Past Surgical History:  Procedure Laterality Date  . BLADDER SUSPENSION N/A 04/08/2020   Procedure: POSSIBLE TRANSVAGINAL TAPE (TVT) PROCEDURE MIDURETHAL SLING;  Surgeon: Nunzio Cobbs, MD;  Location: [facility];  Service: Gynecology;  Laterality: N/A;  . CERVICAL BIOPSY  W/ LOOP ELECTRODE EXCISION    . CESAREAN SECTION  2007 & 2010  . CYSTOSCOPY N/A 04/08/2020   Procedure: CYSTOSCOPY;  Surgeon: Nunzio Cobbs,  MD;  Location: [facility];  Service: Gynecology;  Laterality: N/A;  . KNEE ARTHROPLASTY Right 1989  . OVARIAN CYST SURGERY  1994  . RECTOCELE REPAIR N/A 04/08/2020   Procedure: POSTERIOR REPAIR (RECTOCELE) WITH NATIVE TISSUE REPAIR;  Surgeon: Nunzio Cobbs, MD;  Location: [facility];  Service: Gynecology;  Laterality: N/A;  . SINUS ENDO WITH FUSION    . TOTAL LAPAROSCOPIC HYSTERECTOMY WITH SALPINGECTOMY N/A 04/08/2020   Procedure: TOTAL LAPAROSCOPIC HYSTERECTOMY WITH BILATERAL SALPINGECTOMY;  Surgeon: Megan Salon, MD;  Location: [facility];  Service: Gynecology;  Laterality: N/A;  . TUBAL LIGATION  2010  . VULVA /PERINEUM BIOPSY  01/2015    There were no vitals filed for this visit.   Subjective Assessment - 08/08/20 1142    Subjective Pt said she did the self massage but it is hard to get to the same spots    Currently in Pain? No/denies                             [facility] Adult PT Treatment/Exercise - 08/08/20 0001      Self-Care   Other Self-Care Comments  demo and info on massage wand      Manual Therapy  Manual Therapy Myofascial release    Manual therapy comments pt identity confirmed and internal STM done with informed consent    Myofascial Release abdominal to bladder and vaginal canal in all 6 planes    Internal Pelvic Floor levators and vaginal wall fascial release; scar tissue mobs around posterior vaginal canal                    PT Short Term Goals - 08/05/20 1107      PT SHORT TERM GOAL #1   Title ind with intial HEP    Status Achieved             PT Long Term Goals - 08/05/20 1106      PT LONG TERM GOAL #1   Title ind with advanced HEP    Status On-going      PT LONG TERM GOAL #2   Title No pain or sensativity in perineum    Baseline had gotten worse    Status On-going      PT LONG TERM GOAL #3   Title Pt will be able to sustain pelvic floor contraction for at  least 15 sec for improved functoin and decreased incontinence    Baseline no incontinence    Status On-going                 Plan - 08/08/20 1144    Clinical Impression Statement Pt was able to tolerate more pressure. She is making progress.  She understands how to use pelvic wand and willing to purchase one for home use.  Pt will benefit from skilled PT to continue to progress soft tissue elongation and return to all normal activities.    PT Treatment/Interventions ADLs/Self Care Home Management;Biofeedback;Cryotherapy;Electrical Stimulation;Neuromuscular re-education;Therapeutic exercise;Therapeutic activities;Patient/family education;Taping;Manual techniques;Dry needling;Passive range of motion    PT Next Visit Plan self massage with wand and progress, f/u on stretches and lengthening, dilators/weights; STM internally and abdominal release    PT Home Exercise Plan [id number]    Consulted and Agree with Plan of Care Patient           Patient will benefit from skilled therapeutic intervention in order to improve the following deficits and impairments:  Decreased coordination, Pain, Postural dysfunction, Decreased strength, Increased muscle spasms, Impaired flexibility  Visit Diagnosis: Muscle weakness (generalized)  Other lack of coordination  Stress incontinence (female) (female)     Problem List Patient Active Problem List   Diagnosis Date Noted  . Trochanteric bursitis of left hip 01/12/2020  . Patellofemoral arthritis of right knee 05/11/2018  . Lumbar radiculopathy 03/22/2018  . Nonallopathic lesion of thoracic region 10/07/2017  . Nonallopathic lesion of cervical region 10/07/2017  . Nonallopathic lesion of lumbosacral region 10/07/2017  . Piriformis syndrome of right side 08/24/2017  . Polyarthropathy or polyarthritis of multiple sites 08/24/2017  . 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 Mild episode of recurrent major depressive disorder ([place])  02/16/2017  . Attention deficit hyperactivity disorder (ADHD) 02/16/2017  . Psoriasis 02/16/2017  . Heterozygous factor V Leiden mutation ([place]) 09/01/2016  . Nephrolithiasis 06/07/2012  . Headache, menstrual migraine 06/07/2012  . Malignant melanoma ([place]) 06/07/2012    Jule Ser, PT 08/08/2020, 11:47 AM  [facility] [address], [place], [place], [address] Phone: [phone]   Fax:  [phone]  Name: Autumn [name] MRN: [id number] Date of Birth: 1976/05/28

## 2020-08-13 ENCOUNTER — Ambulatory Visit: Payer: BC Managed Care – PPO | Admitting: Physical Therapy

## 2020-08-13 ENCOUNTER — Encounter: Payer: Self-pay | Admitting: Physical Therapy

## 2020-08-13 ENCOUNTER — Other Ambulatory Visit: Payer: Self-pay

## 2020-08-13 DIAGNOSIS — N393 Stress incontinence (female) (male): Secondary | ICD-10-CM

## 2020-08-13 DIAGNOSIS — M6281 Muscle weakness (generalized): Secondary | ICD-10-CM | POA: Diagnosis not present

## 2020-08-13 DIAGNOSIS — R278 Other lack of coordination: Secondary | ICD-10-CM

## 2020-08-13 NOTE — Therapy (Signed)
[facility] [address], [place], [place], [address] Phone: [phone]   Fax:  [phone]  Physical Therapy Treatment  Patient Details  Name: Autumn [name] MRN: [id number] Date of Birth: Sep 23, 1976 Referring Provider (PT): Janeann Merl, MD   Encounter Date: 08/13/2020   PT End of Session - 08/13/20 1135    Visit Number 6    Date for PT Re-Evaluation 09/04/20    PT Start Time 1100    PT Stop Time 1138    PT Time Calculation (min) 38 min    Activity Tolerance Patient tolerated treatment well    Behavior During Therapy [facility] for tasks assessed/performed           Past Medical History:  Diagnosis Date  . Anemia   . Anxiety   . Attention deficit hyperactivity disorder (ADHD) 02/16/2017   Sees [facility] [facility] for managment  . Back pain   . Calculus of kidney 06/07/2012   Overview:  Dr Karsten Ro - urology   . Cancer ([place])    melanoma removed from abdomen in 2000  . Complication of anesthesia    low BP during C-section  . Depression   . Factor V Leiden carrier ([place])    heterozygous  . Hernia, rectovaginal 06/07/2012   Overview:  Dr Corinna Capra - gyn   . History of abnormal cervical Pap smear 1997   dysplasia- LEEP  . History of reconstructive repair of rectocele   . IBS (irritable bowel syndrome)   . Joint pain   . Lower extremity edema   . Migraine   . Ovarian cyst 1994  . Psoriasis 02/16/2017   -sees dermatologist  . Psoriatic arthritis [facility])     Past Surgical History:  Procedure Laterality Date  . BLADDER SUSPENSION N/A 04/08/2020   Procedure: POSSIBLE TRANSVAGINAL TAPE (TVT) PROCEDURE MIDURETHAL SLING;  Surgeon: Nunzio Cobbs, MD;  Location: [facility];  Service: Gynecology;  Laterality: N/A;  . CERVICAL BIOPSY  W/ LOOP ELECTRODE EXCISION    . CESAREAN SECTION  2007 & 2010  . CYSTOSCOPY N/A 04/08/2020   Procedure: CYSTOSCOPY;  Surgeon: Nunzio Cobbs,  MD;  Location: [facility];  Service: Gynecology;  Laterality: N/A;  . KNEE ARTHROPLASTY Right 1989  . OVARIAN CYST SURGERY  1994  . RECTOCELE REPAIR N/A 04/08/2020   Procedure: POSTERIOR REPAIR (RECTOCELE) WITH NATIVE TISSUE REPAIR;  Surgeon: Nunzio Cobbs, MD;  Location[facility];  Service: Gynecology;  Laterality: N/A;  . SINUS ENDO WITH FUSION    . TOTAL LAPAROSCOPIC HYSTERECTOMY WITH SALPINGECTOMY N/A 04/08/2020   Procedure: TOTAL LAPAROSCOPIC HYSTERECTOMY WITH BILATERAL SALPINGECTOMY;  Surgeon: Megan Salon, MD;  Location: [facility];  Service: Gynecology;  Laterality: N/A;  . TUBAL LIGATION  2010  . VULVA /PERINEUM BIOPSY  01/2015    There were no vitals filed for this visit.   Subjective Assessment - 08/13/20 1102    Subjective Pt states things have been feeling a lot better.  She reports hot flashes the last few nights which has never happened. Able to have intercourse without pain last week    Patient Stated Goals learn exercises    Currently in Pain? No/denies                             [facility] Adult PT Treatment/Exercise - 08/13/20 0001  Self-Care   Other Self-Care Comments  info and demo on cupping for scar release      Manual Therapy   Manual therapy comments pt identity confirmed and internal STM done with informed consent    Myofascial Release scar tissue release with cupping fascial release    Internal Pelvic Floor levators and vaginal wall fascial release; scar tissue mobs around posterior vaginal canal                    PT Short Term Goals - 08/05/20 1107      PT SHORT TERM GOAL #1   Title ind with intial HEP    Status Achieved             PT Long Term Goals - 08/05/20 1106      PT LONG TERM GOAL #1   Title ind with advanced HEP    Status On-going      PT LONG TERM GOAL #2   Title No pain or sensativity in perineum    Baseline had gotten worse    Status  On-going      PT LONG TERM GOAL #3   Title Pt will be able to sustain pelvic floor contraction for at least 15 sec for improved functoin and decreased incontinence    Baseline no incontinence    Status On-going                 Plan - 08/13/20 1145    Clinical Impression Statement Pt responded well to STM and scar massge using cup today.  Pt has tight abdominal scar Rt>Lt from previous surgeries and felt like it has been getting tighter.  Pt will benefit from skilled PT to continue to work on soft tissue length and begin strengthening as able.    PT Treatment/Interventions ADLs/Self Care Home Management;Biofeedback;Cryotherapy;Electrical Stimulation;Neuromuscular re-education;Therapeutic exercise;Therapeutic activities;Patient/family education;Taping;Manual techniques;Dry needling;Passive range of motion    PT Next Visit Plan f/u on cupping to scar; begin/review strengthening for pelivic strength, STM internal re-assess strength    PT Home Exercise Plan [id number]    Consulted and Agree with Plan of Care Patient           Patient will benefit from skilled therapeutic intervention in order to improve the following deficits and impairments:  Decreased coordination, Pain, Postural dysfunction, Decreased strength, Increased muscle spasms, Impaired flexibility  Visit Diagnosis: Muscle weakness (generalized)  Other lack of coordination  Stress incontinence (female) (female)     Problem List Patient Active Problem List   Diagnosis Date Noted  . Trochanteric bursitis of left hip 01/12/2020  . Patellofemoral arthritis of right knee 05/11/2018  . Lumbar radiculopathy 03/22/2018  . Nonallopathic lesion of thoracic region 10/07/2017  . Nonallopathic lesion of cervical region 10/07/2017  . Nonallopathic lesion of lumbosacral region 10/07/2017  . Piriformis syndrome of right side 08/24/2017  . Polyarthropathy or polyarthritis of multiple sites 08/24/2017  . Injury of digital nerve of  left ring finger 08/02/2017  . Mild episode of recurrent major depressive disorder ([place]) 02/16/2017  . Attention deficit hyperactivity disorder (ADHD) 02/16/2017  . Psoriasis 02/16/2017  . Heterozygous factor V Leiden mutation ([place]) 09/01/2016  . Nephrolithiasis 06/07/2012  . Headache, menstrual migraine 06/07/2012  . Malignant melanoma ([place]) 06/07/2012    Jule Ser, PT 08/13/2020, 11:49 AM  [facility] [address] [place], [place], [address] Phone: [phone]   Fax:  [phone]  Name: Autumn [name] MRN: [id number] Date  of Birth: 1976/10/02

## 2020-08-14 ENCOUNTER — Other Ambulatory Visit (INDEPENDENT_AMBULATORY_CARE_PROVIDER_SITE_OTHER): Payer: Self-pay | Admitting: Family Medicine

## 2020-08-15 ENCOUNTER — Ambulatory Visit: Payer: BC Managed Care – PPO | Admitting: Obstetrics & Gynecology

## 2020-08-15 ENCOUNTER — Telehealth: Payer: Self-pay

## 2020-08-15 NOTE — Telephone Encounter (Signed)
Spoke with patient. Hx TLH, still has ovaries. Reports increased emotional changes, have increased over the past week. Experiencing hot flashes at night and possibly during the day. Stopped Effexor 2 months ago, hx of depression and anxiety. States she has discussed prestiq as an option in the past. Spouse is transitioning between jobs, he is home more. Was seen in office on 8/27, started vaginal estrogen cream for vaginal dryness and dyspareunia, medication is working well.   Recommended OV or MyChart visit with [name]. Patient agreeable to [place] visit. If labs are needed, she is aware she can schedule lab visit after. MyChart scheduled for 9/13 at 1pm with [name].   Routing to provider for final review. Patient is agreeable to disposition. Will close encounter.

## 2020-08-15 NOTE — Telephone Encounter (Signed)
Patient is calling in regards to hormonal changes including night sweats and hot flashes. Patient would like to discuss with nurse.

## 2020-08-16 ENCOUNTER — Ambulatory Visit: Payer: BC Managed Care – PPO | Admitting: Physical Therapy

## 2020-08-16 ENCOUNTER — Other Ambulatory Visit: Payer: Self-pay

## 2020-08-16 DIAGNOSIS — N393 Stress incontinence (female) (male): Secondary | ICD-10-CM

## 2020-08-16 DIAGNOSIS — R278 Other lack of coordination: Secondary | ICD-10-CM | POA: Diagnosis not present

## 2020-08-16 DIAGNOSIS — M6281 Muscle weakness (generalized): Secondary | ICD-10-CM

## 2020-08-17 NOTE — Therapy (Signed)
[facility] [address], [place], [place], [address] Phone: [phone]   Fax:  [phone]  Physical Therapy Treatment  Patient Details  Name: Autumn [name] MRN: [id number] Date of Birth: Jun 18, 1976 Referring Provider (PT): Janeann Merl, MD   Encounter Date: 08/16/2020   PT End of Session - 08/17/20 2018    Visit Number 7    Date for PT Re-Evaluation 09/04/20    PT Start Time 1017    PT Stop Time 1055    PT Time Calculation (min) 38 min    Activity Tolerance Patient tolerated treatment well    Behavior During Therapy [facility] for tasks assessed/performed           Past Medical History:  Diagnosis Date  . Anemia   . Anxiety   . Attention deficit hyperactivity disorder (ADHD) 02/16/2017   Sees [facility] for managment  . Back pain   . Calculus of kidney 06/07/2012   Overview:  Dr Karsten Ro - urology   . Cancer ([place])    melanoma removed from abdomen in 2000  . Complication of anesthesia    low BP during C-section  . Depression   . Factor V Leiden carrier ([place])    heterozygous  . Hernia, rectovaginal 06/07/2012   Overview:  Dr Corinna Capra - gyn   . History of abnormal cervical Pap smear 1997   dysplasia- LEEP  . History of reconstructive repair of rectocele   . IBS (irritable bowel syndrome)   . Joint pain   . Lower extremity edema   . Migraine   . Ovarian cyst 1994  . Psoriasis 02/16/2017   -sees dermatologist  . Psoriatic arthritis [facility])     Past Surgical History:  Procedure Laterality Date  . BLADDER SUSPENSION N/A 04/08/2020   Procedure: POSSIBLE TRANSVAGINAL TAPE (TVT) PROCEDURE MIDURETHAL SLING;  Surgeon: Nunzio Cobbs, MD;  Location: [facility];  Service: Gynecology;  Laterality: N/A;  . CERVICAL BIOPSY  W/ LOOP ELECTRODE EXCISION    . CESAREAN SECTION  2007 & 2010  . CYSTOSCOPY N/A 04/08/2020   Procedure: CYSTOSCOPY;  Surgeon: Nunzio Cobbs,  MD;  [facility];  Service: Gynecology;  Laterality: N/A;  . KNEE ARTHROPLASTY Right 1989  . OVARIAN CYST SURGERY  1994  . RECTOCELE REPAIR N/A 04/08/2020   Procedure: POSTERIOR REPAIR (RECTOCELE) WITH NATIVE TISSUE REPAIR;  Surgeon: Nunzio Cobbs, MD;  Location: [facility];  Service: Gynecology;  Laterality: N/A;  . SINUS ENDO WITH FUSION    . TOTAL LAPAROSCOPIC HYSTERECTOMY WITH SALPINGECTOMY N/A 04/08/2020   Procedure: TOTAL LAPAROSCOPIC HYSTERECTOMY WITH BILATERAL SALPINGECTOMY;  Surgeon: Megan Salon, MD;  Location: [facility];  Service: Gynecology;  Laterality: N/A;  . TUBAL LIGATION  2010  . VULVA /PERINEUM BIOPSY  01/2015    There were no vitals filed for this visit.   Subjective Assessment - 08/17/20 2021    Subjective Pt states she still has another little irritation that feels like wiping too hard.  Pt states the cupping helped for a little while.    Currently in Pain? No/denies                             [facility] Adult PT Treatment/Exercise - 08/17/20 0001      Manual Therapy   Manual therapy comments pt identity confirmed and internal STM  done with informed consent    Myofascial Release scar tissue release with cupping fascial release    Internal Pelvic Floor levators and vaginal wall fascial release; scar tissue mobs around posterior vaginal canal                    PT Short Term Goals - 08/05/20 1107      PT SHORT TERM GOAL #1   Title ind with intial HEP    Status Achieved             PT Long Term Goals - 08/17/20 2021      PT LONG TERM GOAL #1   Title ind with advanced HEP    Status On-going      PT LONG TERM GOAL #2   Title No pain or sensativity in perineum    Baseline has been okay, no pain    Status Achieved      PT LONG TERM GOAL #3   Title Pt will be able to sustain pelvic floor contraction for at least 15 sec for improved functoin and decreased  incontinence    Baseline no incontinence    Status Partially Met                 Plan - 08/17/20 2018    Clinical Impression Statement Pt is making excellent progress.  Pt has much less fascial restriction in vaginal canal and seems to be managing the symptoms on her own very well.  She was given infomation on cupping technique to scar tissue in lower abdomen.  Pt will benefit from 1-2 more visits to ensure she is fine on her own.  Pt will make one more visit and potentially cancel if not needed.    PT Treatment/Interventions ADLs/Self Care Home Management;Biofeedback;Cryotherapy;Electrical Stimulation;Neuromuscular re-education;Therapeutic exercise;Therapeutic activities;Patient/family education;Taping;Manual techniques;Dry needling;Passive range of motion    PT Next Visit Plan f/u on cupping to scar; begin/review strengthening for pelivic strength, STM internal re-assess strength    PT Home Exercise Plan [id number]    Consulted and Agree with Plan of Care Patient           Patient will benefit from skilled therapeutic intervention in order to improve the following deficits and impairments:  Decreased coordination, Pain, Postural dysfunction, Decreased strength, Increased muscle spasms, Impaired flexibility  Visit Diagnosis: Muscle weakness (generalized)  Other lack of coordination  Stress incontinence (female) (female)     Problem List Patient Active Problem List   Diagnosis Date Noted  . Trochanteric bursitis of left hip 01/12/2020  . Patellofemoral arthritis of right knee 05/11/2018  . Lumbar radiculopathy 03/22/2018  . Nonallopathic lesion of thoracic region 10/07/2017  . Nonallopathic lesion of cervical region 10/07/2017  . Nonallopathic lesion of lumbosacral region 10/07/2017  . Piriformis syndrome of right side 08/24/2017  . Polyarthropathy or polyarthritis of multiple sites 08/24/2017  . 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 Mild episode of  recurrent major depressive disorder ([place]) 02/16/2017  . Attention deficit hyperactivity disorder (ADHD) 02/16/2017  . Psoriasis 02/16/2017  . Heterozygous factor V Leiden mutation ([place]) 09/01/2016  . Nephrolithiasis 06/07/2012  . Headache, menstrual migraine 06/07/2012  . Malignant melanoma ([place]) 06/07/2012    Jule Ser, PT 08/17/2020, 8:22 PM  [facility] [address] [place], [place], [address] Phone: [phone]   Fax:  [phone]  Name: Autumn [name] MRN: [id number] Date of Birth: [birth year]

## 2020-08-19 ENCOUNTER — Telehealth (INDEPENDENT_AMBULATORY_CARE_PROVIDER_SITE_OTHER): Payer: Self-pay | Admitting: Obstetrics & Gynecology

## 2020-08-19 DIAGNOSIS — R4586 Emotional lability: Secondary | ICD-10-CM

## 2020-08-19 DIAGNOSIS — N951 Menopausal and female climacteric states: Secondary | ICD-10-CM

## 2020-08-19 NOTE — Progress Notes (Addendum)
Virtual Visit via Telephone Note  I connected with Autumn [name] on 08/19/20 at  1:00 PM EDT by telephone and verified that I am speaking with the correct person using two identifiers.  Location: Patient: home Provider: office   I discussed the limitations, risks, security and privacy concerns of performing an evaluation and management service by telephone and the availability of in person appointments. I also discussed with the patient that there may be a patient responsible charge related to this service. The patient expressed understanding and agreed to proceed.   History of Present Illness: 44 yo G2P2 MWF who feels she may be having some hormonal changes.  She reports she stopped her Effexor about two months ago and she is not sure this is related.  States she feels emotional with a lot of mood swings which can be anger, being short tempered, moody.  She reports her husband is receiving the brunt of all of this.  She has been experiencing vaginal dryness and is on vaginal estrogen for this.  This has been helping.  She has noted some increased hot flashes, night sweats as well.  Feels she is using fans more right now.  Also, not sure if this is related to anxiety as well.    She has been seeing Dr. Juleen China at [facility].  Took Wellbutrin for two days but this caused her to feel dizzy.  Stopped Effexor that she's taken for several years about two months ago.  Reports this was really hard to get off of due to side effects so will be reluctant to restarting.  D/w pt typical withdrawal side effects with Effexor that are not typically seen with other SSRI/SNRI medications.     Observations/Objective: none  Assessment and Plan: Possible hormonal changes Mood changes, anxiety H/O ADHD Factor V Leiden heterozygous (no personal hx of DVT)  Follow Up Instructions: She is going to come in for [facility] testing.  Future lab work discussed.  D/w pt possible treatment  options.  Is going to consider and will await lab results.   I discussed the assessment and treatment plan with the patient. The patient was provided an opportunity to ask questions and all were answered. The patient agreed with the plan and demonstrated an understanding of the instructions.  I provided 22 minutes of non-face-to-face time during this encounter.   Megan Salon, MD  Addendum:  Reviewed

## 2020-08-20 ENCOUNTER — Encounter: Payer: Self-pay | Admitting: Obstetrics & Gynecology

## 2020-08-21 ENCOUNTER — Other Ambulatory Visit: Payer: Self-pay

## 2020-08-21 ENCOUNTER — Ambulatory Visit: Payer: BC Managed Care – PPO | Admitting: Physical Therapy

## 2020-08-21 ENCOUNTER — Other Ambulatory Visit (INDEPENDENT_AMBULATORY_CARE_PROVIDER_SITE_OTHER): Payer: BC Managed Care – PPO

## 2020-08-21 DIAGNOSIS — N951 Menopausal and female climacteric states: Secondary | ICD-10-CM

## 2020-08-22 ENCOUNTER — Institutional Professional Consult (permissible substitution): Payer: BC Managed Care – PPO | Admitting: Neurology

## 2020-08-22 LAB — FOLLICLE STIMULATING HORMONE: FSH: 56.9 m[IU]/mL

## 2020-08-22 LAB — ESTRADIOL: Estradiol: 25.8 pg/mL

## 2020-08-26 ENCOUNTER — Ambulatory Visit: Admission: RE | Admit: 2020-08-26 | Payer: BC Managed Care – PPO | Source: Ambulatory Visit

## 2020-08-26 ENCOUNTER — Other Ambulatory Visit: Payer: Self-pay | Admitting: Family Medicine

## 2020-08-26 ENCOUNTER — Ambulatory Visit
Admission: RE | Admit: 2020-08-26 | Discharge: 2020-08-26 | Disposition: A | Discharge status: Home / Self Care | Payer: BC Managed Care – PPO | Source: Ambulatory Visit | Attending: Family Medicine | Admitting: Family Medicine

## 2020-08-26 ENCOUNTER — Other Ambulatory Visit: Payer: Self-pay

## 2020-08-26 DIAGNOSIS — R928 Other abnormal and inconclusive findings on diagnostic imaging of breast: Secondary | ICD-10-CM | POA: Diagnosis not present

## 2020-08-26 DIAGNOSIS — N6489 Other specified disorders of breast: Secondary | ICD-10-CM

## 2020-08-27 ENCOUNTER — Ambulatory Visit (INDEPENDENT_AMBULATORY_CARE_PROVIDER_SITE_OTHER): Payer: BC Managed Care – PPO | Admitting: Family Medicine

## 2020-08-27 ENCOUNTER — Encounter (INDEPENDENT_AMBULATORY_CARE_PROVIDER_SITE_OTHER): Payer: Self-pay | Admitting: Family Medicine

## 2020-08-27 VITALS — BP 103/60 | HR 72 | Temp 98.0°F | Ht 62.0 in | Wt 181.0 lb

## 2020-08-27 DIAGNOSIS — Z780 Asymptomatic menopausal state: Secondary | ICD-10-CM | POA: Diagnosis not present

## 2020-08-27 DIAGNOSIS — Z9189 Other specified personal risk factors, not elsewhere classified: Secondary | ICD-10-CM | POA: Diagnosis not present

## 2020-08-27 DIAGNOSIS — E669 Obesity, unspecified: Secondary | ICD-10-CM

## 2020-08-27 DIAGNOSIS — K6289 Other specified diseases of anus and rectum: Secondary | ICD-10-CM

## 2020-08-27 DIAGNOSIS — F50819 Binge eating disorder, unspecified: Secondary | ICD-10-CM

## 2020-08-27 DIAGNOSIS — Z6833 Body mass index (BMI) 33.0-33.9, adult: Secondary | ICD-10-CM

## 2020-08-27 DIAGNOSIS — F5081 Binge eating disorder: Secondary | ICD-10-CM | POA: Diagnosis not present

## 2020-08-27 DIAGNOSIS — E66811 Obesity, class 1: Secondary | ICD-10-CM

## 2020-08-27 MED ORDER — MUPIROCIN 2 % EX OINT: 1.0000 "application " | TOPICAL_OINTMENT | Freq: Two times a day (BID) | CUTANEOUS | 0 refills | Status: DC | PRN

## 2020-08-27 MED ORDER — LISDEXAMFETAMINE DIMESYLATE 20 MG PO CAPS: 20.0000 mg | ORAL_CAPSULE | Freq: Every day | ORAL | 0 refills | Status: DC

## 2020-08-28 NOTE — Progress Notes (Signed)
Chief Complaint:   OBESITY Autumn [name] is here to discuss her progress with her obesity treatment plan along with follow-up of her obesity related diagnoses. Autumn [name] is on the Category 2 Plan and states she is following her eating plan approximately 75% of the time. Autumn [name] states she is walking/doing yoga for 30+ minutes 2-4 times per week.  Today's visit was #: 4 Starting weight: 187 lbs Starting date: 07/09/2020 Today's weight: 181 lbs Today's date: 08/27/2020 Total lbs lost to date: 6 lbs Total lbs lost since last in-office visit: 4 lbs Total weight loss percentage to date: -3.21%  Interim History: Autumn [name] says that she saw her GYN recently.  She is menopausal and is considering low dose estrogen. Autumn [name] is helping with her impulse control and so she feels that she can make healthier food choices.   Assessment/Plan:   1. Rectal pain Autumn [name] has a fissure.  She has seen Autumn [name], and will be having an early colonoscopy. She reports that the fissure is not healing as quickly as she'd hoped. We discussed using an ointment for skin protection (see below). I also recommend a barrier cream. She will call her PCP or GI for an appointment if not improving.  - Start mupirocin ointment (BACTROBAN) 2 %; Apply 1 application topically 2 (two) times daily as needed.  Dispense: 22 g; Refill: 0  2. Postmenopausal She is followed by Autumn [name].  She is considering low-dose estrogen.  3. Binge eating disorder, with emotional eating Autumn [name] estimates that about 30 percent of adults with binge eating disorder also have a history of ADHD. Binge eating and related obesity can underlie health problems like heart disease and diabetes. The FDA has approved Autumn [name] as a treatment option for both ADHD and binge eating. Autumn [name] targets the brain's reward center by increasing the levels of dopamine and norepinephrine, the chemicals of the brain responsible for feelings of pleasure. Mindful eating is the  recommended nutritional approach to treating BED.   - Refill lisdexamfetamine (Autumn [name]) 20 MG capsule; Take 1 capsule (20 mg total) by mouth daily.  Dispense: 30 capsule; Refill: 0  I have consulted the Autumn [name] for this patient, and feel the risk/benefit ratio today is favorable for proceeding with this prescription for a controlled substance. The patient understands monitoring parameters and red flags.   4. At risk for constipation Autumn [name] is at risk for constipation due to taking Autumn [name].  She was given approximately 15 minutes of counseling today regarding prevention of constipation. She was encouraged to increase Autumn and fiber intake.   5. Class 1 obesity with serious comorbidity and body mass index (BMI) of 33.0 to 33.9 in adult, unspecified obesity type Autumn [name] is currently in the action stage of change. As such, her goal is to continue with weight loss efforts. She has agreed to the Category 2 Plan.   Exercise goals: For substantial health benefits, adults should do at least 150 minutes (2 hours and 30 minutes) a week of moderate-intensity, or 75 minutes (1 hour and 15 minutes) a week of vigorous-intensity aerobic physical activity, or an equivalent combination of moderate- and vigorous-intensity aerobic activity. Aerobic activity should be performed in episodes of at least 10 minutes, and preferably, it should be spread throughout the week.  Behavioral modification strategies: increasing lean protein intake, increasing Autumn intake and increasing high fiber foods.  Autumn [name] has agreed to follow-up with our clinic in 3 weeks. She was informed of the importance of frequent follow-up visits  to maximize her success with intensive lifestyle modifications for her multiple health conditions.   Objective:   Blood pressure 103/60, pulse 72, temperature 98 F (36.7 C), temperature source Oral, height 5\' 2"  (1.575 m), weight 181 lb (82.1 kg), last menstrual period  03/20/2020, SpO2 100 %. Body mass index is 33.11 kg/m.  General: Cooperative, alert, well developed, in no acute distress. HEENT: Conjunctivae and lids unremarkable. Cardiovascular: Regular rhythm.  Lungs: Normal work of breathing. Neurologic: No focal deficits.   Lab Results  Component Value Date   CREATININE 0.7 07/09/2020   BUN 9 07/09/2020   NA 138 07/09/2020   K 4.7 07/09/2020   CL 105 07/09/2020   CO2 22 07/09/2020   Lab Results  Component Value Date   ALT 16 07/09/2020   AST 15 07/09/2020   ALKPHOS 62 07/09/2020   BILITOT 0.8 08/24/2017   Lab Results  Component Value Date   TSH 2.94 07/09/2020   Lab Results  Component Value Date   CHOL 197 07/09/2020   HDL 55 07/09/2020   LDLCALC 125 07/09/2020   TRIG 92 07/09/2020   Lab Results  Component Value Date   WBC 6.6 07/09/2020   HGB 12.9 07/09/2020   HCT 39 07/09/2020   MCV 87 04/15/2020   PLT 167 07/09/2020   Lab Results  Component Value Date   IRON 40 02/15/2020   TIBC 317 02/15/2020   FERRITIN 9 (L) 02/15/2020   Attestation Statements:   Reviewed by clinician on day of visit: allergies, medications, problem list, medical history, surgical history, family history, social history, and previous encounter notes.  I, Autumn [name], CMA, am acting as transcriptionist for Autumn Deutscher, DO  I have reviewed the above documentation for accuracy and completeness, and I agree with the above. Autumn Deutscher, DO

## 2020-08-29 DIAGNOSIS — F419 Anxiety disorder, unspecified: Secondary | ICD-10-CM | POA: Diagnosis not present

## 2020-08-30 ENCOUNTER — Encounter: Payer: Self-pay | Admitting: Physical Therapy

## 2020-08-30 ENCOUNTER — Ambulatory Visit: Payer: BC Managed Care – PPO | Admitting: Physical Therapy

## 2020-08-30 ENCOUNTER — Other Ambulatory Visit: Payer: Self-pay

## 2020-08-30 DIAGNOSIS — R278 Other lack of coordination: Secondary | ICD-10-CM | POA: Diagnosis not present

## 2020-08-30 DIAGNOSIS — M6281 Muscle weakness (generalized): Secondary | ICD-10-CM

## 2020-08-30 DIAGNOSIS — N393 Stress incontinence (female) (male): Secondary | ICD-10-CM

## 2020-08-30 NOTE — Therapy (Signed)
[facility] [address] [place], [place], [address] Phone: [phone]   Fax:  [phone]  Physical Therapy Treatment  Patient Details  Name: Autumn [name] MRN: [id number] Date of Birth: [birth year] Referring Provider (PT): Janeann Merl, MD   Encounter Date: 08/30/2020   PT End of Session - 08/30/20 1021    Visit Number 8    Date for PT Re-Evaluation 09/04/20    PT Start Time 1020    PT Stop Time 1058    PT Time Calculation (min) 38 min    Activity Tolerance Patient tolerated treatment well    Behavior During Therapy [facility] for tasks assessed/performed           Past Medical History:  Diagnosis Date  . Anemia   . Anxiety   . Attention deficit hyperactivity disorder (ADHD) 02/16/2017   Sees [facility] for managment  . Back pain   . Calculus of kidney 06/07/2012   Overview:  Dr Karsten Ro - urology   . Cancer ([place])    melanoma removed from abdomen in 2000  . Complication of anesthesia    low BP during C-section  . Depression   . Factor V Leiden carrier ([place])    heterozygous  . Hernia, rectovaginal 06/07/2012   Overview:  Dr Corinna Capra - gyn   . History of abnormal cervical Pap smear 1997   dysplasia- LEEP  . History of reconstructive repair of rectocele   . IBS (irritable bowel syndrome)   . Joint pain   . Lower extremity edema   . Migraine   . Ovarian cyst 1994  . Psoriasis 02/16/2017   -sees dermatologist  . Psoriatic arthritis [facility])     Past Surgical History:  Procedure Laterality Date  . BLADDER SUSPENSION N/A 04/08/2020   Procedure: POSSIBLE TRANSVAGINAL TAPE (TVT) PROCEDURE MIDURETHAL SLING;  Surgeon: Nunzio Cobbs, MD;  Location: [facility];  Service: Gynecology;  Laterality: N/A;  . CERVICAL BIOPSY  W/ LOOP ELECTRODE EXCISION    . CESAREAN SECTION  2007 & 2010  . CYSTOSCOPY N/A 04/08/2020   Procedure: CYSTOSCOPY;  Surgeon: Nunzio Cobbs,  MD;  Location: [facility];  Service: Gynecology;  Laterality: N/A;  . KNEE ARTHROPLASTY Right 1989  . OVARIAN CYST SURGERY  1994  . RECTOCELE REPAIR N/A 04/08/2020   Procedure: POSTERIOR REPAIR (RECTOCELE) WITH NATIVE TISSUE REPAIR;  Surgeon: Nunzio Cobbs, MD;  Location: [facility];  Service: Gynecology;  Laterality: N/A;  . SINUS ENDO WITH FUSION    . TOTAL LAPAROSCOPIC HYSTERECTOMY WITH SALPINGECTOMY N/A 04/08/2020   Procedure: TOTAL LAPAROSCOPIC HYSTERECTOMY WITH BILATERAL SALPINGECTOMY;  Surgeon: Megan Salon, MD;  Location: [facility];  Service: Gynecology;  Laterality: N/A;  . TUBAL LIGATION  2010  . VULVA /PERINEUM BIOPSY  01/2015    There were no vitals filed for this visit.   Subjective Assessment - 08/30/20 1158    Subjective I am doing great, no complaints other than had a flare up of hemorrhoids but that is not new                          Pelvic Floor Special Questions - 08/30/20 0001    Pelvic Floor Internal Exam pt identity confirmed and informed consent given to perform internal assessment    Exam Type Vaginal    Strength fair squeeze, definite lift  Strength # of seconds 15             [facility] Adult PT Treatment/Exercise - 08/30/20 0001      Self-Care   Other Self-Care Comments  toileting techniques and abdominal massage techniques      Manual Therapy   Manual therapy comments pt identity confirmed and internal STM done with informed consent    Myofascial Release scar tissue release with cupping fascial release to abdomen and around colon                  PT Education - 08/30/20 1159    Education Details discussed toileting techniques and abdominal massage to avoid any constipation            PT Short Term Goals - 08/05/20 1107      PT SHORT TERM GOAL #1   Title ind with intial HEP    Status Achieved             PT Long Term Goals - 08/30/20 1201      PT LONG  TERM GOAL #1   Title ind with advanced HEP    Status Achieved      PT LONG TERM GOAL #2   Title No pain or sensativity in perineum    Status Achieved      PT LONG TERM GOAL #3   Title Pt will be able to sustain pelvic floor contraction for at least 15 sec for improved functoin and decreased incontinence    Baseline 15 sec    Status Achieved                 Plan - 08/30/20 1200    Clinical Impression Statement Pt did well with treatment today.  Discussed how to continue with HEP  Also added some information on how to manage constipation in order to avoid flare ups of fissure/hemorrhoids.  She is also going to talk to doctor about this.  Pt has met all goals and will discharge today.           Patient will benefit from skilled therapeutic intervention in order to improve the following deficits and impairments:     Visit Diagnosis: Muscle weakness (generalized)  Other lack of coordination  Stress incontinence (female) (female)     Problem List Patient Active Problem List   Diagnosis Date Noted  . Trochanteric bursitis of left hip 01/12/2020  . Patellofemoral arthritis of right knee 05/11/2018  . Lumbar radiculopathy 03/22/2018  . Nonallopathic lesion of thoracic region 10/07/2017  . Nonallopathic lesion of cervical region 10/07/2017  . Nonallopathic lesion of lumbosacral region 10/07/2017  . Piriformis syndrome of right side 08/24/2017  . Polyarthropathy or polyarthritis of multiple sites 08/24/2017  . 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 Mild episode of recurrent major depressive disorder ([place]) 02/16/2017  . Attention deficit hyperactivity disorder (ADHD) 02/16/2017  . Psoriasis 02/16/2017  . Heterozygous factor V Leiden mutation ([place]) 09/01/2016  . Nephrolithiasis 06/07/2012  . Headache, menstrual migraine 06/07/2012  . Malignant melanoma ([place]) 06/07/2012    Jule Ser, PT 08/30/2020, 12:03 PM  [facility] [address] [place], [place], [address] Phone: [phone]   Fax:  [phone]  Name: Autumn [name] MRN: [id number] Date of Birth: 25-Oct-1976  PHYSICAL THERAPY DISCHARGE SUMMARY  Visits from Start of Care: 8  Current functional level related to goals / functional outcomes: See above goals met  Remaining deficits: See above   Education / Equipment: HEP  Plan: Patient agrees to discharge.  Patient goals were met. Patient is being discharged due to meeting the stated rehab goals.  ?????     [employer], PT 08/30/20 12:04 PM

## 2020-09-02 ENCOUNTER — Encounter: Payer: Self-pay | Admitting: Obstetrics & Gynecology

## 2020-09-05 DIAGNOSIS — F419 Anxiety disorder, unspecified: Secondary | ICD-10-CM | POA: Diagnosis not present

## 2020-09-10 ENCOUNTER — Encounter: Payer: Self-pay | Admitting: Obstetrics & Gynecology

## 2020-09-11 ENCOUNTER — Telehealth: Payer: Self-pay

## 2020-09-11 ENCOUNTER — Other Ambulatory Visit: Payer: Self-pay | Admitting: Obstetrics & Gynecology

## 2020-09-11 DIAGNOSIS — D239 Other benign neoplasm of skin, unspecified: Secondary | ICD-10-CM | POA: Diagnosis not present

## 2020-09-11 DIAGNOSIS — D229 Melanocytic nevi, unspecified: Secondary | ICD-10-CM | POA: Diagnosis not present

## 2020-09-11 DIAGNOSIS — L700 Acne vulgaris: Secondary | ICD-10-CM | POA: Diagnosis not present

## 2020-09-11 DIAGNOSIS — Z85820 Personal history of malignant melanoma of skin: Secondary | ICD-10-CM | POA: Diagnosis not present

## 2020-09-11 DIAGNOSIS — D6851 Activated protein C resistance: Secondary | ICD-10-CM

## 2020-09-11 DIAGNOSIS — L570 Actinic keratosis: Secondary | ICD-10-CM | POA: Diagnosis not present

## 2020-09-11 NOTE — Telephone Encounter (Signed)
Pt sent following mychart message:  Autumn [name], [place] [facility] Hi! I have been thinking about our conversation re: factor v and estrogen and recommendation from hematologist.   I believe I would like to follow up with a visit to the hematologist (one that you would recommend). If you recall, my factor v info came from [facility], a saliva swab taken to determine medication compatibility. I would be interested to know how accurate this type of test is (from a hematologist prospective) and/or if it could be validated from a second (maybe more accurate) test. Or is this a lab that you would be able to order? Thoughts on this?   My night sweats and hot flashes have continued, I had 3-4 last night. If estrogen truly is not an option for me, which I understand but just want to make sure, then I will possibly try other options (Pristiq, etc). Thank you!

## 2020-09-11 NOTE — Telephone Encounter (Signed)
Routing to Dr Sabra Heck, please review and advise.

## 2020-09-11 NOTE — Telephone Encounter (Signed)
Referral placed to Dr. Marin Olp.  I think it would be best for him to do the repeat testing for this.  Can do that and consult at same time.  Can you follow to make sure this gets scheduled?  Thanks.

## 2020-09-12 NOTE — Telephone Encounter (Signed)
Spoke with pt. Pt given recommendations per Dr Sabra Heck. Pt agreeable and verbalized understanding. Pt thankful for referral.  Dr Antonieta Pert office to call pt to schedule. Pt aware.  Encounter closed.  Cc: Rosa for referral

## 2020-09-15 DIAGNOSIS — D229 Melanocytic nevi, unspecified: Secondary | ICD-10-CM | POA: Diagnosis not present

## 2020-09-15 DIAGNOSIS — L570 Actinic keratosis: Secondary | ICD-10-CM | POA: Diagnosis not present

## 2020-09-15 DIAGNOSIS — L700 Acne vulgaris: Secondary | ICD-10-CM | POA: Diagnosis not present

## 2020-09-15 DIAGNOSIS — Z85820 Personal history of malignant melanoma of skin: Secondary | ICD-10-CM | POA: Diagnosis not present

## 2020-09-15 DIAGNOSIS — D239 Other benign neoplasm of skin, unspecified: Secondary | ICD-10-CM | POA: Diagnosis not present

## 2020-09-16 DIAGNOSIS — Z1211 Encounter for screening for malignant neoplasm of colon: Secondary | ICD-10-CM | POA: Diagnosis not present

## 2020-09-17 ENCOUNTER — Ambulatory Visit (INDEPENDENT_AMBULATORY_CARE_PROVIDER_SITE_OTHER): Payer: BC Managed Care – PPO | Admitting: Family Medicine

## 2020-09-17 ENCOUNTER — Other Ambulatory Visit: Payer: Self-pay

## 2020-09-17 ENCOUNTER — Encounter (INDEPENDENT_AMBULATORY_CARE_PROVIDER_SITE_OTHER): Payer: Self-pay | Admitting: Family Medicine

## 2020-09-17 VITALS — BP 110/68 | HR 73 | Temp 98.8°F | Ht 62.0 in | Wt 180.0 lb

## 2020-09-17 DIAGNOSIS — R61 Generalized hyperhidrosis: Secondary | ICD-10-CM | POA: Diagnosis not present

## 2020-09-17 DIAGNOSIS — E669 Obesity, unspecified: Secondary | ICD-10-CM | POA: Diagnosis not present

## 2020-09-17 DIAGNOSIS — D6851 Activated protein C resistance: Secondary | ICD-10-CM

## 2020-09-17 DIAGNOSIS — F5081 Binge eating disorder: Secondary | ICD-10-CM | POA: Diagnosis not present

## 2020-09-17 DIAGNOSIS — Z6833 Body mass index (BMI) 33.0-33.9, adult: Secondary | ICD-10-CM

## 2020-09-18 NOTE — Progress Notes (Signed)
Chief Complaint:   OBESITY Autumn [name] is here to discuss her progress with her obesity treatment plan along with follow-up of her obesity related diagnoses. Autumn [name] is on the Category 2 Plan and states she is following her eating plan approximately 70% of the time. Felipe states she is walking and doing yoga for 30-45 minutes 4 times per week.  Today's visit was #: 5 Starting weight: 187 lbs Starting date: 07/09/2020 Today's weight: 180 lbs Today's date: 09/17/2020 Total lbs lost to date: 7 lbs Total lbs lost since last in-office visit: 1 lb Total weight loss percentage to date: -3.74%  Interim History: Autumn [name] says that her aunt was recently diagnosed with breast cancer and had genetic testing done yesterday.  Autumn [name] was diagnosed with heterozygous factor V Leiden mutation via a saliva test over a year ago.  She has been suffering from perimenopausal vasomotor symptoms more recently.  She is working with her gynecologist to decide if low-dose hormone support would be helpful.  Risk versus reward is still being discussed and Autumn [name] will be seeing hematology in the near future.  She reports improvement in binge eating patterns with Vyvanse.  She denies any side effects.  She does wonder if she needs a stronger dose.  Assessment/Plan:   1. Factor 5 Leiden mutation, heterozygous (Autumn) [name] has an upcoming appointment with hematology.  She questions the accuracy of her previous test.  I will go ahead and repeat the test to confirm so that is available for her next appointment.  - Factor 5 leiden  2. Night sweats Discussed symptomatic treatment.  She is working with her gynecologist on this issue.We will continue to monitor symptoms as they relate to her weight loss journey.  3. Binge eating disorder, with emotional eating Improving.  We discussed increasing the dose slightly.  Risk versus benefits of medication reviewed. The patient understands monitoring parameters and red flags.   People  who binge eat feel as if they don't have control over how much they eat and have feelings of guilt or self-loathing after a binge eating episode. Autumn [name] estimates that about 30 percent of adults with binge eating disorder also have a history of ADHD. Binge eating and related obesity can underlie health problems like heart disease and diabetes. The FDA has approved Vyvanse as a treatment option for both ADHD and binge eating. Vyvanse targets the brain's reward center by increasing the levels of dopamine and norepinephrine, the chemicals of the brain responsible for feelings of pleasure. Mindful eating is the recommended nutritional approach to treating BED.   - Increase lisdexamfetamine (VYVANSE) 30 MG capsule; Take 1 capsule (30 mg total) by mouth daily.  Dispense: 30 capsule; Refill: 0  I have consulted the [place] Controlled Substances Registry for this patient, and feel the risk/benefit ratio today is favorable for proceeding with this prescription for a controlled substance. The patient understands monitoring parameters and red flags.   4. Class 1 obesity with serious comorbidity and body mass index (BMI) of 33.0 to 33.9 in adult, unspecified obesity type  Autumn [name] is currently in the action stage of change. As such, her goal is to continue with weight loss efforts. She has agreed to the Category 2 Plan.   Exercise goals: For substantial health benefits, adults should do at least 150 minutes (2 hours and 30 minutes) a week of moderate-intensity, or 75 minutes (1 hour and 15 minutes) a week of vigorous-intensity aerobic physical activity, or an equivalent combination of moderate- and vigorous-intensity  aerobic activity. Aerobic activity should be performed in episodes of at least 10 minutes, and preferably, it should be spread throughout the week.  Behavioral modification strategies: increasing lean protein intake, decreasing simple carbohydrates, increasing vegetables and increasing water  intake.  Autumn [name] has agreed to follow-up with our clinic in 3 weeks. She was informed of the importance of frequent follow-up visits to maximize her success with intensive lifestyle modifications for her multiple health conditions.   Objective:   Blood pressure 110/68, pulse 73, temperature 98.8 F (37.1 C), temperature source Oral, height 5\' 2"  (1.575 m), weight 180 lb (81.6 kg), last menstrual period 03/20/2020, SpO2 98 %. Body mass index is 32.92 kg/m.  General: Cooperative, alert, well developed, in no acute distress. HEENT: Conjunctivae and lids unremarkable. Cardiovascular: Regular rhythm.  Lungs: Normal work of breathing. Neurologic: No focal deficits.   Lab Results  Component Value Date   CREATININE 0.7 07/09/2020   BUN 9 07/09/2020   NA 138 07/09/2020   K 4.7 07/09/2020   CL 105 07/09/2020   CO2 22 07/09/2020   Lab Results  Component Value Date   ALT 16 07/09/2020   AST 15 07/09/2020   ALKPHOS 62 07/09/2020   BILITOT 0.8 08/24/2017   Lab Results  Component Value Date   TSH 2.94 07/09/2020   Lab Results  Component Value Date   CHOL 197 07/09/2020   HDL 55 07/09/2020   LDLCALC 125 07/09/2020   TRIG 92 07/09/2020   Lab Results  Component Value Date   WBC 6.6 07/09/2020   HGB 12.9 07/09/2020   HCT 39 07/09/2020   MCV 87 04/15/2020   PLT 167 07/09/2020   Lab Results  Component Value Date   IRON 40 02/15/2020   TIBC 317 02/15/2020   FERRITIN 9 (L) 02/15/2020   Attestation Statements:   Reviewed by clinician on day of visit: allergies, medications, problem list, medical history, surgical history, family history, social history, and previous encounter notes.  Time spent on visit including pre-visit chart review and post-visit care and charting was 45 minutes.   I, Water quality scientist, CMA, am acting as transcriptionist for Briscoe Deutscher, DO  I have reviewed the above documentation for accuracy and completeness, and I agree with the above. Briscoe Deutscher, DO

## 2020-09-19 ENCOUNTER — Encounter (INDEPENDENT_AMBULATORY_CARE_PROVIDER_SITE_OTHER): Payer: Self-pay | Admitting: Family Medicine

## 2020-09-19 NOTE — Telephone Encounter (Signed)
Please review

## 2020-09-20 DIAGNOSIS — F419 Anxiety disorder, unspecified: Secondary | ICD-10-CM | POA: Diagnosis not present

## 2020-09-20 LAB — FACTOR 5 LEIDEN

## 2020-09-23 MED ORDER — LISDEXAMFETAMINE DIMESYLATE 30 MG PO CAPS: 30.0000 mg | ORAL_CAPSULE | Freq: Every day | ORAL | 0 refills | Status: DC

## 2020-09-27 DIAGNOSIS — L4050 Arthropathic psoriasis, unspecified: Secondary | ICD-10-CM | POA: Diagnosis not present

## 2020-09-27 DIAGNOSIS — K591 Functional diarrhea: Secondary | ICD-10-CM | POA: Diagnosis not present

## 2020-09-27 DIAGNOSIS — R768 Other specified abnormal immunological findings in serum: Secondary | ICD-10-CM | POA: Diagnosis not present

## 2020-09-27 DIAGNOSIS — L409 Psoriasis, unspecified: Secondary | ICD-10-CM | POA: Diagnosis not present

## 2020-10-04 DIAGNOSIS — F909 Attention-deficit hyperactivity disorder, unspecified type: Secondary | ICD-10-CM | POA: Diagnosis not present

## 2020-10-04 DIAGNOSIS — F419 Anxiety disorder, unspecified: Secondary | ICD-10-CM | POA: Diagnosis not present

## 2020-10-08 ENCOUNTER — Ambulatory Visit (INDEPENDENT_AMBULATORY_CARE_PROVIDER_SITE_OTHER): Payer: BC Managed Care – PPO | Admitting: Family Medicine

## 2020-10-08 ENCOUNTER — Other Ambulatory Visit: Payer: Self-pay

## 2020-10-08 ENCOUNTER — Encounter (INDEPENDENT_AMBULATORY_CARE_PROVIDER_SITE_OTHER): Payer: Self-pay | Admitting: Family Medicine

## 2020-10-08 VITALS — BP 111/72 | HR 75 | Temp 98.1°F | Ht 62.0 in | Wt 178.0 lb

## 2020-10-08 DIAGNOSIS — R4586 Emotional lability: Secondary | ICD-10-CM

## 2020-10-08 DIAGNOSIS — E669 Obesity, unspecified: Secondary | ICD-10-CM

## 2020-10-08 DIAGNOSIS — D6851 Activated protein C resistance: Secondary | ICD-10-CM

## 2020-10-08 DIAGNOSIS — Z6832 Body mass index (BMI) 32.0-32.9, adult: Secondary | ICD-10-CM

## 2020-10-08 DIAGNOSIS — F5081 Binge eating disorder: Secondary | ICD-10-CM | POA: Diagnosis not present

## 2020-10-09 NOTE — Progress Notes (Signed)
Chief Complaint:   OBESITY Autumn [name] is here to discuss her progress with her obesity treatment plan along with follow-up of her obesity related diagnoses.   Today's visit was #: 6 Starting weight: 187 lbs Starting date: 07/09/2020 Today's weight: 178 lbs Today's date: 10/08/2020 Total lbs lost to date: 9 lbs Body mass index is 32.56 kg/m.  Total weight loss percentage to date: -4.81%  Interim History: Autumn [name] is positive for factor V Leiden and will see Dr. Marin Olp this week regarding safety of taking estrogen for menopausal vasomotor symptoms and mood lability.  She says her mood was low last week. Nutrition Plan: the Category 2 Plan for 50% of the time.  Activity: Walking for 30 minutes 2-3 times per week.  Assessment/Plan:   1. Factor 5 Leiden mutation, heterozygous [facility]) Discussed labs with patient today.  Autumn [name] will be seeing Dr. Marin Olp this week.  Will follow.  2. Binge eating disorder Autumn [name] is taking Vyvanse 30 mg daily. The current medical regimen is effective;  continue present plan and medications. We will continue to monitor symptoms as they relate to her weight loss journey.  3. Labile mood Autumn [name] says her mood was low last week.  Mood has been labile recently.  She hopes to be able to take estrogen to help with these menopausal symptoms, but will follow recommendations from Hematology and GYN.  4. Class 1 obesity with serious comorbidity and body mass index (BMI) of 32.0 to 32.9 in adult, unspecified obesity type  Course: Autumn [name] is currently in the action stage of change. As such, her goal is to continue with weight loss efforts.   Nutrition goals: She has agreed to the Category 2 Plan.   Exercise goals: For substantial health benefits, adults should do at least 150 minutes (2 hours and 30 minutes) a week of moderate-intensity, or 75 minutes (1 hour and 15 minutes) a week of vigorous-intensity aerobic physical activity, or an equivalent combination of moderate-  and vigorous-intensity aerobic activity. Aerobic activity should be performed in episodes of at least 10 minutes, and preferably, it should be spread throughout the week.  Behavioral modification strategies: increasing lean protein intake, decreasing simple carbohydrates, increasing vegetables, increasing water intake and decreasing liquid calories.  Autumn [name] has agreed to follow-up with our clinic in 3-4 weeks. She was informed of the importance of frequent follow-up visits to maximize her success with intensive lifestyle modifications for her multiple health conditions.   Objective:   Blood pressure 111/72, pulse 75, temperature 98.1 F (36.7 C), temperature source Oral, height 5\' 2"  (1.575 m), weight 178 lb (80.7 kg), last menstrual period 03/20/2020, SpO2 99 %. Body mass index is 32.56 kg/m.  General: Cooperative, alert, well developed, in no acute distress. HEENT: Conjunctivae and lids unremarkable. Cardiovascular: Regular rhythm.  Lungs: Normal work of breathing. Neurologic: No focal deficits.   Lab Results  Component Value Date   CREATININE 0.7 07/09/2020   BUN 9 07/09/2020   NA 138 07/09/2020   K 4.7 07/09/2020   CL 105 07/09/2020   CO2 22 07/09/2020   Lab Results  Component Value Date   ALT 16 07/09/2020   AST 15 07/09/2020   ALKPHOS 62 07/09/2020   BILITOT 0.8 08/24/2017   Lab Results  Component Value Date   TSH 2.94 07/09/2020   Lab Results  Component Value Date   CHOL 197 07/09/2020   HDL 55 07/09/2020   LDLCALC 125 07/09/2020   TRIG 92 07/09/2020   Lab Results  Component  Value Date   WBC 6.6 07/09/2020   HGB 12.9 07/09/2020   HCT 39 07/09/2020   MCV 87 04/15/2020   PLT 167 07/09/2020   Lab Results  Component Value Date   IRON 40 02/15/2020   TIBC 317 02/15/2020   FERRITIN 9 (L) 02/15/2020   Attestation Statements:   Reviewed by clinician on day of visit: allergies, medications, problem list, medical history, surgical history, family history,  social history, and previous encounter notes. Time: 35 minutes.   I, Water quality scientist, CMA, am acting as transcriptionist for Briscoe Deutscher, DO  I have reviewed the above documentation for accuracy and completeness, and I agree with the above. Briscoe Deutscher, DO

## 2020-10-10 ENCOUNTER — Inpatient Hospital Stay (HOSPITAL_BASED_OUTPATIENT_CLINIC_OR_DEPARTMENT_OTHER): Payer: BC Managed Care – PPO | Admitting: Hematology & Oncology

## 2020-10-10 ENCOUNTER — Encounter: Payer: Self-pay | Admitting: Hematology & Oncology

## 2020-10-10 ENCOUNTER — Other Ambulatory Visit: Payer: Self-pay

## 2020-10-10 ENCOUNTER — Inpatient Hospital Stay: Payer: BC Managed Care – PPO | Attending: Hematology & Oncology

## 2020-10-10 ENCOUNTER — Telehealth: Payer: Self-pay | Admitting: Obstetrics & Gynecology

## 2020-10-10 VITALS — BP 127/78 | HR 77 | Temp 97.9°F | Resp 16 | Wt 181.0 lb

## 2020-10-10 DIAGNOSIS — Z803 Family history of malignant neoplasm of breast: Secondary | ICD-10-CM

## 2020-10-10 DIAGNOSIS — Z90710 Acquired absence of both cervix and uterus: Secondary | ICD-10-CM

## 2020-10-10 DIAGNOSIS — Z85820 Personal history of malignant melanoma of skin: Secondary | ICD-10-CM | POA: Insufficient documentation

## 2020-10-10 DIAGNOSIS — F909 Attention-deficit hyperactivity disorder, unspecified type: Secondary | ICD-10-CM | POA: Diagnosis not present

## 2020-10-10 DIAGNOSIS — Z808 Family history of malignant neoplasm of other organs or systems: Secondary | ICD-10-CM

## 2020-10-10 DIAGNOSIS — D6851 Activated protein C resistance: Secondary | ICD-10-CM | POA: Diagnosis not present

## 2020-10-10 LAB — CBC WITH DIFFERENTIAL (CANCER CENTER ONLY)
Abs Immature Granulocytes: 0.01 10*3/uL (ref 0.00–0.07)
Basophils Absolute: 0 10*3/uL (ref 0.0–0.1)
Basophils Relative: 0 %
Eosinophils Absolute: 0.1 10*3/uL (ref 0.0–0.5)
Eosinophils Relative: 1 %
HCT: 37.6 % (ref 36.0–46.0)
Hemoglobin: 12.3 g/dL (ref 12.0–15.0)
Immature Granulocytes: 0 %
Lymphocytes Relative: 36 %
Lymphs Abs: 2.8 10*3/uL (ref 0.7–4.0)
MCH: 29.4 pg (ref 26.0–34.0)
MCHC: 32.7 g/dL (ref 30.0–36.0)
MCV: 90 fL (ref 80.0–100.0)
Monocytes Absolute: 0.4 10*3/uL (ref 0.1–1.0)
Monocytes Relative: 6 %
Neutro Abs: 4.4 10*3/uL (ref 1.7–7.7)
Neutrophils Relative %: 57 %
Platelet Count: 245 10*3/uL (ref 150–400)
RBC: 4.18 MIL/uL (ref 3.87–5.11)
RDW: 12.5 % (ref 11.5–15.5)
WBC Count: 7.8 10*3/uL (ref 4.0–10.5)
nRBC: 0 % (ref 0.0–0.2)

## 2020-10-10 MED ORDER — ESTRADIOL 0.025 MG/24HR TD PTTW: 1.0000 | MEDICATED_PATCH | TRANSDERMAL | 3 refills | Status: DC

## 2020-10-10 NOTE — Telephone Encounter (Signed)
Pt saw Dr. Marin Olp today for consultation about possible HRT usage.  H/O factor V Leiden heterozygous factor.  Pt aware HRT is not recommended with this history.  However, she is very symptomatic and wanted to consider.  H/O 2 pregnancies and OCP use and no h/o DVT.  Dr. Marin Olp felt low dosage patch HRT was safe.  Vivelle dot 0.025mg  patches, twice weekly will be initiated.  Pt clearly aware of risks including DVT/PE/stroke.  She's going to stop the estradiol vaginal cream after starting the patch.  Topical external aquaphor recommended for any external irritation.

## 2020-10-10 NOTE — Progress Notes (Signed)
Referral MD  Reason for Referral: heterozygote Factor V Leiden  Chief Complaint  Patient presents with  . New Patient (Initial Visit)  : I am having menopausal symptoms and have factor V Leiden.  HPI: Autumn [name] is a very charming 43 year old white female.  She is a whole lot of fun to talk to.  She is originally from [place].  She has 2 children.  She was on oral contraceptives for about 20 years.  She has never had any issues with blood clots.  She is not sure of anybody in the family who has had blood clots.  The problem is that she is having a lot of menopausal symptoms.  She did have a hysterectomy earlier this year.  She has difficulties with medications.  She has ADHD.  Of note, she does have a history of melanoma that was removed from the abdomen back in 2000.  This was done by dermatology so this may have been a noninvasive melanoma.  She sees dermatology every 6 months.  She has had multiple nonmelanoma skin lesions removed.  Because of the factor V Leiden issue, her gynecologist, [name], was a little bit reluctant to put her on estrogens.  Autumn [name] does not smoke.  She does not have diabetes.  She is a Agricultural engineer.  She has 2 boys.  She stays active with him.  She has had multiple surgeries without any issues.  She has never had any miscarriages.  She has had no problems with weight loss.  She does have some weight issues and I think is seen weight management clinic.  She has had no fever.  There is no problems with the coronavirus.  She has had no problems with her bowels or bladder.  Patient has had her mammograms.  The there is a history of breast cancer in a maternal aunt who was 78 years old.  Currently, I would say her performance status is ECOG 0.     Past Medical History:  Diagnosis Date  . Anemia   . Anxiety   . Attention deficit hyperactivity disorder (ADHD) 02/16/2017   Sees [facility] for managment  . Back pain   . Calculus  of kidney 06/07/2012   Overview:  Dr Karsten Ro - urology   . Cancer ([place])    melanoma removed from abdomen in 2000  . Complication of anesthesia    low BP during C-section  . Depression   . Factor V Leiden carrier ([place])    heterozygous  . Hernia, rectovaginal 06/07/2012   Overview:  Dr Corinna Capra - gyn   . History of abnormal cervical Pap smear 1997   dysplasia- LEEP  . History of reconstructive repair of rectocele   . IBS (irritable bowel syndrome)   . Joint pain   . Lower extremity edema   . Migraine   . Ovarian cyst 1994  . Psoriasis 02/16/2017   -sees dermatologist  . Psoriatic arthritis ([place])   :  Past Surgical History:  Procedure Laterality Date  . BLADDER SUSPENSION N/A 04/08/2020   Procedure: POSSIBLE TRANSVAGINAL TAPE (TVT) PROCEDURE MIDURETHAL SLING;  Surgeon: Nunzio Cobbs, MD;  Location: [facility];  Service: Gynecology;  Laterality: N/A;  . CERVICAL BIOPSY  W/ LOOP ELECTRODE EXCISION    . CESAREAN SECTION  2007 & 2010  . CYSTOSCOPY N/A 04/08/2020   Procedure: CYSTOSCOPY;  Surgeon: Nunzio Cobbs, MD;  Location: [facility];  Service: Gynecology;  Laterality: N/A;  .  KNEE ARTHROPLASTY Right 1989  . OVARIAN CYST SURGERY  1994  . RECTOCELE REPAIR N/A 04/08/2020   Procedure: POSTERIOR REPAIR (RECTOCELE) WITH NATIVE TISSUE REPAIR;  Surgeon: Nunzio Cobbs, MD;  Location: [facility];  Service: Gynecology;  Laterality: N/A;  . SINUS ENDO WITH FUSION    . TOTAL LAPAROSCOPIC HYSTERECTOMY WITH SALPINGECTOMY N/A 04/08/2020   Procedure: TOTAL LAPAROSCOPIC HYSTERECTOMY WITH BILATERAL SALPINGECTOMY;  Surgeon: Megan Salon, MD;  Location: [facility];  Service: Gynecology;  Laterality: N/A;  . TUBAL LIGATION  2010  . VULVA /PERINEUM BIOPSY  01/2015  :   Current Outpatient Medications:  .  estradiol (ESTRACE) 0.1 MG/GM vaginal cream, Apply topically as directed twice weekly., Disp: 42.5 g, Rfl: 1 .   lisdexamfetamine (VYVANSE) 30 MG capsule, Take 1 capsule (30 mg total) by mouth daily., Disp: 30 capsule, Rfl: 0 .  Multiple Vitamin (MULTIVITAMIN WITH MINERALS) TABS tablet, Take 1 tablet by mouth daily., Disp: , Rfl:  .  mupirocin ointment (BACTROBAN) 2 %, Apply 1 application topically 2 (two) times daily as needed., Disp: 22 g, Rfl: 0 .  olopatadine (PATADAY) 0.1 % ophthalmic solution, Place 1 drop into both eyes daily. , Disp: , Rfl:  .  OVER THE COUNTER MEDICATION, CBD Gummies PRN sleep, Disp: , Rfl:  .  Secukinumab (COSENTYX SENSOREADY PEN) 150 MG/ML SOAJ, Inject 300 mg into the skin every 30 (thirty) days. , Disp: , Rfl:  .  Ubrogepant (UBRELVY) 100 MG TABS, Take 100 mg by mouth daily as needed (migraine). , Disp: , Rfl:  .  UNABLE TO FIND, julva external vaginal moisturizer, Disp: , Rfl: :  :  No Known Allergies:  Family History  Problem Relation Age of Onset  . Hyperlipidemia Mother   . Esophageal cancer Father   . Hyperlipidemia Father   . Cancer Father   . Esophageal cancer Maternal Uncle   . Rectal cancer Maternal Uncle   . Diabetes Maternal Grandmother   . Heart disease Maternal Grandmother   . Breast cancer Maternal Aunt 76       stage 1  :  Social History   Socioeconomic History  . Marital status: Married    Spouse name: Not on file  . Number of children: Not on file  . Years of education: Not on file  . Highest education level: Not on file  Occupational History  . Occupation: stay at home spouse  Tobacco Use  . Smoking status: Never Smoker  . Smokeless tobacco: Never Used  Vaping Use  . Vaping Use: Never used  Substance and Sexual Activity  . Alcohol use: Yes    Alcohol/week: 0.0 - 3.0 standard drinks    Comment: occas.  . Drug use: No  . Sexual activity: Yes    Partners: Male    Birth control/protection: Surgical    Comment: Hysterectomy  Other Topics Concern  . Not on file  Social History Narrative   Work or School: homemaker      Home  Situation: 2 children      Spiritual Beliefs:      Lifestyle: regular exercise   Social Determinants of Health   Financial Resource Strain:   . Difficulty of Paying Living Expenses: Not on file  Food Insecurity:   . Worried About Charity fundraiser in the Last Year: Not on file  . Ran Out of Food in the Last Year: Not on file  Transportation Needs:   . Lack of  Transportation (Medical): Not on file  . Lack of Transportation (Non-Medical): Not on file  Physical Activity:   . Days of Exercise per Week: Not on file  . Minutes of Exercise per Session: Not on file  Stress:   . Feeling of Stress : Not on file  Social Connections:   . Frequency of Communication with Friends and Family: Not on file  . Frequency of Social Gatherings with Friends and Family: Not on file  . Attends Religious Services: Not on file  . Active Member of Clubs or Organizations: Not on file  . Attends Archivist Meetings: Not on file  . Marital Status: Not on file  Intimate Partner Violence:   . Fear of Current or Ex-Partner: Not on file  . Emotionally Abused: Not on file  . Physically Abused: Not on file  . Sexually Abused: Not on file  :  Review of Systems  Constitutional: Negative.   HENT: Negative.   Eyes: Negative.   Respiratory: Negative.   Cardiovascular: Negative.   Gastrointestinal: Negative.   Genitourinary: Negative.   Musculoskeletal: Negative.   Skin: Negative.   Neurological: Negative.   Endo/Heme/Allergies: Negative.   Psychiatric/Behavioral: Negative.      Exam:  This is a well-developed and well-nourished white female in no obvious distress.  Vital signs show a temperature of 97.9.  Pulse 77.  Blood pressure is 127/78.  Weight is 181 pounds.  Head and neck exam shows no ocular or oral lesions.  There are no palpable cervical or supraclavicular lymph nodes.  Lungs are clear bilaterally.  Cardiac exam regular rate and rhythm with no murmurs, rubs or bruits.  Abdomen is  soft.  She has good bowel sounds.  There is no fluid wave.  There is no palpable liver or spleen tip.  Back exam shows no tenderness over the spine, ribs or hips.  Extremities shows no clubbing, cyanosis or edema.  Neurological exam shows no focal neurological deficits.  Skin exam shows multiple surgical scars from skin lesions that were resected.  She has fair skin.  I do not see anything that looked suspicious.  @IPVITALS @   Recent Labs    10/10/20 1458  WBC 7.8  HGB 12.3  HCT 37.6  PLT 245   No results for input(s): NA, K, CL, CO2, GLUCOSE, BUN, CREATININE, CALCIUM in the last 72 hours.  Blood smear review: None  Pathology: None    Assessment and Plan: Autumn [name] is a very charming 44 year old white female.  She has heterozygous factor V Leiden mutation.  She has never had a thromboembolic event.  I think that her symptoms are significant enough that it would be worthwhile trying her on a low-dose estrogen patch.  I think the estrogen patches have a low likelihood of thromboembolic disease.  I think that the benefit outweighs the risk for Autumn [name].  She is quite young.  She is affected by these menopausal symptoms.  I think she has problems with other category of agents that are used for menopausal symptoms (i.e. gabapentin).  I told her that it probably would not be about I did a have her on baby aspirin if she goes on the estrogen patch.  I think 81 mg a day would not be a bad idea.  I also think that folic acid over-the-counter would not be a bad idea also.  She definitely is amenable to trying the estrogen patch.  I am sure that [name] will put her on the lowest  dose possible.  I told Autumn [name] that if she travels long distance (i.e. cross-country on airplane) that it would not be a bad idea to have her on low-dose anticoagulation during the trip.  I do think this would provide a little more at a measure of protection for her.  Again she is very nice.  She really has  no other risk factors for thromboembolic disease.  I know she does have a little bit of extra weight on her but I do not feel that this is really considered a risk factor.  I spent a good 45 minutes with her.  It was fun talking to her.  I will let [name] know what we talked about.

## 2020-10-11 LAB — HOMOCYSTEINE: Homocysteine: 6.8 umol/L (ref 0.0–14.5)

## 2020-10-15 LAB — FACTOR 5 LEIDEN

## 2020-10-15 LAB — MTHFR DNA ANALYSIS

## 2020-10-16 ENCOUNTER — Encounter (INDEPENDENT_AMBULATORY_CARE_PROVIDER_SITE_OTHER): Payer: Self-pay

## 2020-11-04 DIAGNOSIS — F909 Attention-deficit hyperactivity disorder, unspecified type: Secondary | ICD-10-CM | POA: Diagnosis not present

## 2020-11-04 DIAGNOSIS — F419 Anxiety disorder, unspecified: Secondary | ICD-10-CM | POA: Diagnosis not present

## 2020-11-06 ENCOUNTER — Ambulatory Visit (INDEPENDENT_AMBULATORY_CARE_PROVIDER_SITE_OTHER): Payer: BC Managed Care – PPO | Admitting: Family Medicine

## 2020-11-11 ENCOUNTER — Other Ambulatory Visit: Payer: Self-pay

## 2020-11-11 ENCOUNTER — Ambulatory Visit (INDEPENDENT_AMBULATORY_CARE_PROVIDER_SITE_OTHER): Payer: BC Managed Care – PPO | Admitting: Family Medicine

## 2020-11-11 ENCOUNTER — Encounter (INDEPENDENT_AMBULATORY_CARE_PROVIDER_SITE_OTHER): Payer: Self-pay | Admitting: Family Medicine

## 2020-11-11 VITALS — BP 116/74 | HR 83 | Temp 98.1°F | Ht 62.0 in | Wt 179.0 lb

## 2020-11-11 DIAGNOSIS — E559 Vitamin D deficiency, unspecified: Secondary | ICD-10-CM | POA: Diagnosis not present

## 2020-11-11 DIAGNOSIS — F411 Generalized anxiety disorder: Secondary | ICD-10-CM

## 2020-11-11 DIAGNOSIS — Z9189 Other specified personal risk factors, not elsewhere classified: Secondary | ICD-10-CM

## 2020-11-11 DIAGNOSIS — F5081 Binge eating disorder: Secondary | ICD-10-CM

## 2020-11-11 DIAGNOSIS — R5383 Other fatigue: Secondary | ICD-10-CM | POA: Diagnosis not present

## 2020-11-11 DIAGNOSIS — Z6832 Body mass index (BMI) 32.0-32.9, adult: Secondary | ICD-10-CM

## 2020-11-11 DIAGNOSIS — R790 Abnormal level of blood mineral: Secondary | ICD-10-CM | POA: Diagnosis not present

## 2020-11-11 DIAGNOSIS — E669 Obesity, unspecified: Secondary | ICD-10-CM

## 2020-11-11 DIAGNOSIS — N959 Unspecified menopausal and perimenopausal disorder: Secondary | ICD-10-CM | POA: Diagnosis not present

## 2020-11-11 MED ORDER — FLUOXETINE HCL 20 MG PO TABS: 20.0000 mg | ORAL_TABLET | Freq: Every day | ORAL | 0 refills | Status: DC

## 2020-11-12 LAB — COMPREHENSIVE METABOLIC PANEL
ALT: 13 IU/L (ref 0–32)
AST: 16 IU/L (ref 0–40)
Albumin/Globulin Ratio: 1.6 (ref 1.2–2.2)
Albumin: 4.1 g/dL (ref 3.8–4.8)
Alkaline Phosphatase: 83 IU/L (ref 44–121)
BUN/Creatinine Ratio: 17 (ref 9–23)
BUN: 12 mg/dL (ref 6–24)
Bilirubin Total: 0.4 mg/dL (ref 0.0–1.2)
CO2: 25 mmol/L (ref 20–29)
Calcium: 8.7 mg/dL (ref 8.7–10.2)
Chloride: 103 mmol/L (ref 96–106)
Creatinine, Ser: 0.71 mg/dL (ref 0.57–1.00)
GFR calc Af Amer: 118 mL/min/{1.73_m2} (ref >59)
GFR calc non Af Amer: 102 mL/min/{1.73_m2} (ref >59)
Globulin, Total: 2.6 g/dL (ref 1.5–4.5)
Glucose: 89 mg/dL (ref 65–99)
Potassium: 4.2 mmol/L (ref 3.5–5.2)
Sodium: 139 mmol/L (ref 134–144)
Total Protein: 6.7 g/dL (ref 6.0–8.5)

## 2020-11-12 LAB — ANEMIA PANEL
Ferritin: 10 ng/mL — ABNORMAL LOW (ref 15–150)
Folate, Hemolysate: 418 ng/mL
Folate, RBC: 1091 ng/mL (ref >498)
Hematocrit: 38.3 % (ref 34.0–46.6)
Iron Saturation: 20 % (ref 15–55)
Iron: 63 ug/dL (ref 27–159)
Retic Ct Pct: 1.1 % (ref 0.6–2.6)
Total Iron Binding Capacity: 315 ug/dL (ref 250–450)
UIBC: 252 ug/dL (ref 131–425)
Vitamin B-12: 403 pg/mL (ref 232–1245)

## 2020-11-12 LAB — TSH: TSH: 3.96 u[IU]/mL (ref 0.450–4.500)

## 2020-11-12 LAB — T4, FREE: Free T4: 0.98 ng/dL (ref 0.82–1.77)

## 2020-11-12 LAB — VITAMIN D 25 HYDROXY (VIT D DEFICIENCY, FRACTURES): Vit D, 25-Hydroxy: 32.9 ng/mL (ref 30.0–100.0)

## 2020-11-12 LAB — THYROID PEROXIDASE ANTIBODY: Thyroperoxidase Ab SerPl-aCnc: <8 IU/mL (ref 0–34)

## 2020-11-12 NOTE — Progress Notes (Signed)
Chief Complaint:   OBESITY Autumn [name] is here to discuss her progress with her obesity treatment plan along with follow-up of her obesity related diagnoses.   Today's visit was #: 7 Starting weight: 187 lbs Starting date: 07/09/2020 Today's weight: 179 lbs Today's date: 11/11/2020 Total lbs lost to date: 8 lbs Body mass index is 32.74 kg/m.  Total weight loss percentage to date: -4.28%  Interim History: [name] says that wearing the estrogen patch ramped up her dizziness.  She says her mood is labile still.   Wellbutrin causes dizziness for her.  She is seeing [name], PA with [facility].  She will be starting with [facility].  She is considering Dr. [name] and considering [name]. Nutrition Plan: the Category 2 Plan for 60% of the time.  Activity: None at this time.  Assessment/Plan:   1. Other fatigue Narjis reports having more fatigue than usual recently.    Plan:  Check labs today.  - Anemia panel - Comprehensive metabolic panel - TSH - T4, free - Thyroid Peroxidase Antibodies (TPO) (REFL) - VITAMIN D 25 Hydroxy (Vit-D Deficiency, Fractures)  2. Vitamin D deficiency Will check vitamin D level today.  - Anemia panel - Comprehensive metabolic panel - TSH - T4, free - Thyroid Peroxidase Antibodies (TPO) (REFL) - VITAMIN D 25 Hydroxy (Vit-D Deficiency, Fractures)  3. Binge eating disorder Improving with Vyvanse. The current medical regimen is effective;  continue present plan and medications.  4. GAD (generalized anxiety disorder) Behavior modification techniques were discussed today to help Elfrida deal with her anxiety.    - FLUoxetine (PROZAC) 20 MG tablet; Take 1 tablet (20 mg total) by mouth daily.  Dispense: 30 tablet; Refill: 0  5. At risk for side effect of medication Dashay was given approximately 8 minutes of drug side effect counseling today.  We discussed side effect possibility and risk versus benefits. Margurite agreed to the medication and will contact this  office if these side effects are intolerable.  6. Class 1 obesity with serious comorbidity and body mass index (BMI) of 32.0 to 32.9 in adult, unspecified obesity type  Course: [name] is currently in the action stage of change. As such, her goal is to continue with weight loss efforts.   Nutrition goals: She has agreed to the Category 2 Plan.   Exercise goals: For substantial health benefits, adults should do at least 150 minutes (2 hours and 30 minutes) a week of moderate-intensity, or 75 minutes (1 hour and 15 minutes) a week of vigorous-intensity aerobic physical activity, or an equivalent combination of moderate- and vigorous-intensity aerobic activity. Aerobic activity should be performed in episodes of at least 10 minutes, and preferably, it should be spread throughout the week.  Behavioral modification strategies: increasing lean protein intake, decreasing simple carbohydrates, increasing vegetables and increasing water intake.  [name] has agreed to follow-up with our clinic in 4 weeks. She was informed of the importance of frequent follow-up visits to maximize her success with intensive lifestyle modifications for her multiple health conditions.   Objective:   Blood pressure 116/74, pulse 83, temperature 98.1 F (36.7 C), temperature source Oral, height 5\' 2"  (1.575 m), weight 179 lb (81.2 kg), last menstrual period 03/20/2020, SpO2 97 %. Body mass index is 32.74 kg/m.  General: Cooperative, alert, well developed, in no acute distress. HEENT: Conjunctivae and lids unremarkable. Cardiovascular: Regular rhythm.  Lungs: Normal work of breathing. Neurologic: No focal deficits.   Lab Results  Component Value Date   CREATININE 0.71 11/11/2020  BUN 12 11/11/2020   NA 139 11/11/2020   K 4.2 11/11/2020   CL 103 11/11/2020   CO2 25 11/11/2020   Lab Results  Component Value Date   ALT 13 11/11/2020   AST 16 11/11/2020   ALKPHOS 83 11/11/2020   BILITOT 0.4 11/11/2020   Lab  Results  Component Value Date   TSH 3.960 11/11/2020   Lab Results  Component Value Date   CHOL 197 07/09/2020   HDL 55 07/09/2020   LDLCALC 125 07/09/2020   TRIG 92 07/09/2020   Lab Results  Component Value Date   WBC 7.8 10/10/2020   HGB 12.3 10/10/2020   HCT 38.3 11/11/2020   MCV 90.0 10/10/2020   PLT 245 10/10/2020   Lab Results  Component Value Date   IRON 63 11/11/2020   TIBC 315 11/11/2020   FERRITIN 10 (L) 11/11/2020   Attestation Statements:   Reviewed by clinician on day of visit: allergies, medications, problem list, medical history, surgical history, family history, social history, and previous encounter notes.  I, Water quality scientist, CMA, am acting as transcriptionist for Briscoe Deutscher, DO  I have reviewed the above documentation for accuracy and completeness, and I agree with the above. Briscoe Deutscher, DO

## 2020-11-14 ENCOUNTER — Encounter (INDEPENDENT_AMBULATORY_CARE_PROVIDER_SITE_OTHER): Payer: Self-pay | Admitting: Family Medicine

## 2020-11-14 DIAGNOSIS — F5081 Binge eating disorder: Secondary | ICD-10-CM

## 2020-11-15 MED ORDER — LISDEXAMFETAMINE DIMESYLATE 30 MG PO CAPS: 30.0000 mg | ORAL_CAPSULE | Freq: Every day | ORAL | 0 refills | Status: DC

## 2020-11-19 DIAGNOSIS — F419 Anxiety disorder, unspecified: Secondary | ICD-10-CM | POA: Diagnosis not present

## 2020-11-19 DIAGNOSIS — F909 Attention-deficit hyperactivity disorder, unspecified type: Secondary | ICD-10-CM | POA: Diagnosis not present

## 2020-12-04 DIAGNOSIS — R059 Cough, unspecified: Secondary | ICD-10-CM | POA: Diagnosis not present

## 2020-12-04 DIAGNOSIS — Z20822 Contact with and (suspected) exposure to covid-19: Secondary | ICD-10-CM | POA: Diagnosis not present

## 2020-12-04 DIAGNOSIS — J329 Chronic sinusitis, unspecified: Secondary | ICD-10-CM | POA: Diagnosis not present

## 2020-12-04 DIAGNOSIS — R0981 Nasal congestion: Secondary | ICD-10-CM | POA: Diagnosis not present

## 2020-12-04 DIAGNOSIS — R519 Headache, unspecified: Secondary | ICD-10-CM | POA: Diagnosis not present

## 2020-12-04 DIAGNOSIS — J320 Chronic maxillary sinusitis: Secondary | ICD-10-CM | POA: Diagnosis not present

## 2020-12-10 DIAGNOSIS — F419 Anxiety disorder, unspecified: Secondary | ICD-10-CM | POA: Diagnosis not present

## 2020-12-10 DIAGNOSIS — Z79899 Other long term (current) drug therapy: Secondary | ICD-10-CM | POA: Diagnosis not present

## 2020-12-10 DIAGNOSIS — F338 Other recurrent depressive disorders: Secondary | ICD-10-CM | POA: Diagnosis not present

## 2020-12-10 DIAGNOSIS — F900 Attention-deficit hyperactivity disorder, predominantly inattentive type: Secondary | ICD-10-CM | POA: Diagnosis not present

## 2020-12-11 ENCOUNTER — Ambulatory Visit (INDEPENDENT_AMBULATORY_CARE_PROVIDER_SITE_OTHER): Payer: BC Managed Care – PPO | Admitting: Family Medicine

## 2020-12-11 ENCOUNTER — Encounter (INDEPENDENT_AMBULATORY_CARE_PROVIDER_SITE_OTHER): Payer: Self-pay | Admitting: Family Medicine

## 2020-12-11 ENCOUNTER — Other Ambulatory Visit: Payer: Self-pay

## 2020-12-11 VITALS — BP 103/57 | HR 83 | Temp 98.4°F | Ht 62.0 in | Wt 176.0 lb

## 2020-12-11 DIAGNOSIS — E66811 Obesity, class 1: Secondary | ICD-10-CM

## 2020-12-11 DIAGNOSIS — E559 Vitamin D deficiency, unspecified: Secondary | ICD-10-CM

## 2020-12-11 DIAGNOSIS — F908 Attention-deficit hyperactivity disorder, other type: Secondary | ICD-10-CM

## 2020-12-11 DIAGNOSIS — E669 Obesity, unspecified: Secondary | ICD-10-CM

## 2020-12-11 DIAGNOSIS — R790 Abnormal level of blood mineral: Secondary | ICD-10-CM | POA: Diagnosis not present

## 2020-12-11 DIAGNOSIS — Z9189 Other specified personal risk factors, not elsewhere classified: Secondary | ICD-10-CM

## 2020-12-11 DIAGNOSIS — Z6832 Body mass index (BMI) 32.0-32.9, adult: Secondary | ICD-10-CM

## 2020-12-12 NOTE — Progress Notes (Signed)
Chief Complaint:   OBESITY Autumn [name] is here to discuss her progress with her obesity treatment plan along with follow-up of her obesity related diagnoses.   Today's visit was #: 8 Starting weight: 187 lbs Starting date: 07/09/2020 Today's weight: 176 lbs Today's date: 12/11/2020 Total lbs lost to date: 11 lbs Body mass index is 32.19 kg/m.  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  She currently only has an air fryer, microwave, and refrigerator.  She says she is still okay with breakfast.  Over the next 3 months:  The key will be to decrease stress and make good choices. Nutrition Plan: the Category 2 Plan for 50% of the time.  Activity: None at this time.  Assessment/Plan:   1. Low ferritin  Lab Results  Component Value Date   FERRITIN 10 (L) 11/11/2020   Take iron supplement every other day.  Will recheck in 1-2 months.  2. Vitamin D deficiency Not at goal. Current vitamin D is 32.9, tested on 11/11/2020. Optimal goal > 50 ng/dL.   Plan:  []   Continue Vitamin D @50 ,000 IU every week. [x]   Continue home supplement of 1,000-2,000 IU daily. [x]   Follow-up for routine testing of Vitamin D at least 2-3 times per year to avoid over-replacement.  3. Attention deficit hyperactivity disorder (ADHD), other type Autumn [name] is working with [name] again.  She will be starting [facility] tomorrow/when available. We will continue to monitor symptoms as they relate to her weight loss journey.  4. At risk for anxiety Autumn [name] was given approximately 8 minutes of anxiety risk counseling today. She has risk factors for anxiety including a new home project. We discussed the importance of a healthy work life balance, a healthy relationship with food and a good support system.  5. Class 1 obesity with serious comorbidity and body mass index (BMI) of 32.0 to 32.9 in adult, unspecified obesity type  Course: Autumn [name] is currently in  the action stage of change. As such, her goal is to continue with weight loss efforts.   Nutrition goals: She has agreed to the Category 2 Plan.   Exercise goals: For substantial health benefits, adults should do at least 150 minutes (2 hours and 30 minutes) a week of moderate-intensity, or 75 minutes (1 hour and 15 minutes) a week of vigorous-intensity aerobic physical activity, or an equivalent combination of moderate- and vigorous-intensity aerobic activity. Aerobic activity should be performed in episodes of at least 10 minutes, and preferably, it should be spread throughout the week.  Behavioral modification strategies: increasing lean protein intake, decreasing simple carbohydrates, increasing vegetables, increasing water intake and decreasing liquid calories.  Autumn [name] has agreed to follow-up with our clinic in 4 weeks. She was informed of the importance of frequent follow-up visits to maximize her success with intensive lifestyle modifications for her multiple health conditions.   Objective:   Blood pressure (!) 103/57, pulse 83, temperature 98.4 F (36.9 C), temperature source Oral, height 5\' 2"  (1.575 m), weight 176 lb (79.8 kg), last menstrual period 03/20/2020, SpO2 100 %. Body mass index is 32.19 kg/m.  General: Cooperative, alert, well developed, in no acute distress. HEENT: Conjunctivae and lids unremarkable. Cardiovascular: Regular rhythm.  Lungs: Normal work of breathing. Neurologic: No focal deficits.   Lab Results  Component Value Date   CREATININE 0.71 11/11/2020   BUN 12 11/11/2020   NA 139 11/11/2020   K 4.2 11/11/2020   CL 103 11/11/2020  CO2 25 11/11/2020   Lab Results  Component Value Date   ALT 13 11/11/2020   AST 16 11/11/2020   ALKPHOS 83 11/11/2020   BILITOT 0.4 11/11/2020   Lab Results  Component Value Date   TSH 3.960 11/11/2020   Lab Results  Component Value Date   CHOL 197 07/09/2020   HDL 55 07/09/2020   LDLCALC 125 07/09/2020   TRIG 92  07/09/2020   Lab Results  Component Value Date   WBC 7.8 10/10/2020   HGB 12.3 10/10/2020   HCT 38.3 11/11/2020   MCV 90.0 10/10/2020   PLT 245 10/10/2020   Lab Results  Component Value Date   IRON 63 11/11/2020   TIBC 315 11/11/2020   FERRITIN 10 (L) 11/11/2020   Attestation Statements:   Reviewed by clinician on day of visit: allergies, medications, problem list, medical history, surgical history, family history, social history, and previous encounter notes.  I, Insurance claims handler, CMA, am acting as transcriptionist for Helane Rima, DO  I have reviewed the above documentation for accuracy and completeness, and I agree with the above. Helane Rima, DO

## 2020-12-19 DIAGNOSIS — F909 Attention-deficit hyperactivity disorder, unspecified type: Secondary | ICD-10-CM | POA: Diagnosis not present

## 2020-12-19 DIAGNOSIS — F419 Anxiety disorder, unspecified: Secondary | ICD-10-CM | POA: Diagnosis not present

## 2020-12-31 ENCOUNTER — Other Ambulatory Visit: Payer: Self-pay

## 2020-12-31 ENCOUNTER — Ambulatory Visit (INDEPENDENT_AMBULATORY_CARE_PROVIDER_SITE_OTHER): Payer: BC Managed Care – PPO | Admitting: Physician Assistant

## 2020-12-31 ENCOUNTER — Encounter: Payer: Self-pay | Admitting: Physician Assistant

## 2020-12-31 VITALS — BP 137/89 | HR 100 | Ht 62.0 in | Wt 170.0 lb

## 2020-12-31 DIAGNOSIS — F329 Major depressive disorder, single episode, unspecified: Secondary | ICD-10-CM | POA: Diagnosis not present

## 2020-12-31 DIAGNOSIS — R790 Abnormal level of blood mineral: Secondary | ICD-10-CM

## 2020-12-31 NOTE — Progress Notes (Signed)
[facility] MD/PA/NP Initial Note  12/31/2020  10:12 AM Autumn [name]  MRN:  [id number]  Chief Complaint:  Chief Complaint    Establish Care      HPI: Referred by Lottie Mussel.  A few months ago, she made this appt to talk about whether she needs to be on an antidepressant or not. But now not sure if she needs it.   She had been having more sadness, decreased energy and motivation.  But those symptoms have resolved.  She is able to sleep well.  Appetite is normal and her weight is stable.  Not isolating.  She is working a couple of days a week and that is going fine.  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  Her kids are 43 and 78 years old.  She does not cry easily.  Denies suicidal or homicidal thoughts.  PCP treats her for ADHD.  Her meds are working for the most part.  Has never had, nor has now, increased energy with decreased need for sleep, no increased talkativeness, no racing thoughts, no impulsivity or risky behaviors, no increased spending, no increased libido, no grandiosity, no increased irritability or anger, and no hallucinations.  She does get anxious sometimes but it is usually triggered by something that she feels anyone would get nervous from.   Visit Diagnosis:    ICD-10-CM   1. 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 Low ferritin  R79.0     Past Psychiatric History:   She has never been hospitalized for psychiatric illnesses.  No suicide attempts.  Past medications for mental health diagnoses include: Effexor helped body aches and pains, Vyvanse, Zoloft and Lexapro caused wt gain and sexual SE, Wellbutrin, Phentermine, Adderall, Adzenys, Cotempla, Mydayis  Past Medical History:  Past Medical History:  Diagnosis Date  . Anemia   . Anxiety   . Attention deficit hyperactivity disorder (ADHD) 02/16/2017   Sees [facility] for  managment  . Back pain   . Calculus of kidney 06/07/2012   Overview:  Dr Karsten Ro - urology   . Cancer ([place])    melanoma removed from abdomen in 2000  . Complication of anesthesia    low BP during C-section  . Depression   . Factor V Leiden carrier ([place])    heterozygous  . Hernia, rectovaginal 06/07/2012   Overview:  Dr Corinna Capra - gyn   . History of abnormal cervical Pap smear 1997   dysplasia- LEEP  . History of reconstructive repair of rectocele   . IBS (irritable bowel syndrome)   . Joint pain   . Lower extremity edema   . Migraine   . Ovarian cyst 1994  . Psoriasis 02/16/2017   -sees dermatologist  . Psoriatic arthritis [facility])     Past Surgical History:  Procedure Laterality Date  . BLADDER SUSPENSION N/A 04/08/2020   Procedure: POSSIBLE TRANSVAGINAL TAPE (TVT) PROCEDURE MIDURETHAL SLING;  Surgeon: Nunzio Cobbs, MD;  Location: [facility];  Service: Gynecology;  Laterality: N/A;  . CERVICAL BIOPSY  W/ LOOP ELECTRODE EXCISION    . CESAREAN SECTION  2007 & 2010  . CYSTOSCOPY N/A 04/08/2020   Procedure: CYSTOSCOPY;  Surgeon: Nunzio Cobbs, MD;  Location: [facility];  Service: Gynecology;  Laterality: N/A;  . KNEE ARTHROPLASTY Right 1989  . OVARIAN CYST SURGERY  1994  .  RECTOCELE REPAIR N/A 04/08/2020   Procedure: POSTERIOR REPAIR (RECTOCELE) WITH NATIVE TISSUE REPAIR;  Surgeon: Nunzio Cobbs, MD;  Location: [facility];  Service: Gynecology;  Laterality: N/A;  . SINUS ENDO WITH FUSION    . TOTAL LAPAROSCOPIC HYSTERECTOMY WITH SALPINGECTOMY N/A 04/08/2020   Procedure: TOTAL LAPAROSCOPIC HYSTERECTOMY WITH BILATERAL SALPINGECTOMY;  Surgeon: Megan Salon, MD;  Location: [facility];  Service: Gynecology;  Laterality: N/A;  . TUBAL LIGATION  2010  . VULVA /PERINEUM BIOPSY  01/2015    Family Psychiatric History: see below  Family History:  Family History  Problem Relation Age of Onset  .  Hyperlipidemia Mother   . Hyperlipidemia Father   . Cancer Father        throat  . Esophageal cancer Maternal Uncle   . Rectal cancer Maternal Uncle   . Diabetes Maternal Grandmother   . Heart disease Maternal Grandmother   . Breast cancer Maternal Aunt 76       stage 1  . Emphysema Maternal Grandfather   . Emphysema Paternal Grandfather   . Healthy Paternal Grandmother   . Healthy Son   . Healthy Son     Social History:  Social History   Socioeconomic History  . Marital status: Married    Spouse name: Not on file  . Number of children: Not on file  . Years of education: Not on file  . Highest education level: Bachelor's degree (e.g., BA, AB, BS)  Occupational History  . Occupation: stay at home spouse  Tobacco Use  . Smoking status: Never Smoker  . Smokeless tobacco: Never Used  Vaping Use  . Vaping Use: Never used  Substance and Sexual Activity  . Alcohol use: Yes    Alcohol/week: 0.0 - 3.0 standard drinks    Comment: occas.  . Drug use: No  . Sexual activity: Yes    Partners: Male    Birth control/protection: Surgical    Comment: Hysterectomy  Other Topics Concern  . Not on file  Social History Narrative   Grew up in [place], Dad worked for [employer] and is retired, did maintenance on buses. Mom worked in Doctor, hospital, then [employer].    Husband of 21 years is Customer service manager. She and husband and 2 sons, 78 and 72 yo.      Works at [employer] at her church 2 days a week, just started after the first of the year.    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 Worked in [place] at [employer].      Spiritual Beliefs: Christian   Caffeine-2 coffee, occas   Legal none   Social Determinants of Health   Financial Resource Strain: Low Risk   . Difficulty of Paying Living Expenses: Not hard at all  Food Insecurity: No Food Insecurity  . Worried About Charity fundraiser in the Last Year: Never true  . Ran Out of Food in the Last Year:  Never true  Transportation Needs: No Transportation Needs  . Lack of Transportation (Medical): No  . Lack of Transportation (Non-Medical): No  Physical Activity: Inactive  . Days of Exercise per Week: 0 days  . Minutes of Exercise per Session: 0 min  Stress: Stress Concern Present  . Feeling of Stress : To some extent  Social Connections: Moderately Integrated  . Frequency of Communication with Friends and Family: More than three times a week  . Frequency  of Social Gatherings with Friends and Family: More than three times a week  . Attends Religious Services: More than 4 times per year  . Active Member of Clubs or Organizations: Yes  . Attends Archivist Meetings: More than 4 times per year  . Marital Status: Widowed    Allergies: No Known Allergies  Metabolic Disorder Labs: No results found for: HGBA1C, MPG No results found for: PROLACTIN Lab Results  Component Value Date   CHOL 197 07/09/2020   TRIG 92 07/09/2020   HDL 55 07/09/2020   LDLCALC 125 07/09/2020   Lab Results  Component Value Date   TSH 3.960 11/11/2020   TSH 2.94 07/09/2020    Therapeutic Level Labs: No results found for: LITHIUM No results found for: VALPROATE No components found for:  CBMZ  Current Medications: Current Outpatient Medications  Medication Sig Dispense Refill  . ferrous sulfate 325 (65 FE) MG tablet Take 325 mg by mouth daily with breakfast.    . lisdexamfetamine (VYVANSE) 30 MG capsule Take 30 mg by mouth daily.    . Multiple Vitamin (MULTIVITAMIN WITH MINERALS) TABS tablet Take 1 tablet by mouth daily.    . Secukinumab (COSENTYX SENSOREADY PEN) 150 MG/ML SOAJ Inject 300 mg into the skin every 30 (thirty) days.     Marland Kitchen Ubrogepant (UBRELVY) 100 MG TABS Take 100 mg by mouth daily as needed (migraine).     Marland Kitchen UNABLE TO FIND julva external vaginal moisturizer (Patient not taking: Reported on 12/31/2020)     No current facility-administered medications for this visit.     Medication Side Effects: none  Orders placed this visit:  No orders of the defined types were placed in this encounter.   Psychiatric Specialty Exam:  Review of Systems  Constitutional: Positive for fatigue.       Fatigue occurs when she does not take Vyvanse.  HENT: Positive for nosebleeds.        Nosebleeds are occasional, worse in the winter.  Eyes: Negative.   Respiratory: Negative.   Cardiovascular: Negative.   Gastrointestinal: Negative.   Endocrine: Negative.   Genitourinary: Negative.   Musculoskeletal: Negative.   Skin: Negative.   Allergic/Immunologic: Negative.   Neurological: Positive for headaches.  Hematological: Negative.   Psychiatric/Behavioral: Negative.     Blood pressure 137/89, pulse 100, height 5\' 2"  (1.575 m), weight 170 lb (77.1 kg), last menstrual period 03/20/2020.Body mass index is 31.09 kg/m.  General Appearance: Casual, Neat and Well Groomed  Eye Contact:  Good  Speech:  Clear and Coherent and Normal Rate  Volume:  Normal  Mood:  Euthymic  Affect:  Appropriate  Thought Process:  Goal Directed and Descriptions of Associations: Intact  Orientation:  Full (Time, Place, and Person)  Thought Content: Logical   Suicidal Thoughts:  No  Homicidal Thoughts:  No  Memory:  WNL  Judgement:  Good  Insight:  Good  Psychomotor Activity:  Normal  Concentration:  Concentration: Good and Attention Span: Good  Recall:  Good  Fund of Knowledge: Good  Language: Good  Assets:  Desire for Improvement  ADL's:  Intact  Cognition: WNL  Prognosis:  Good   Labs: 11/11/2020 Anemia panel normal except ferritin is 10 CMP was completely normal TSH 3.9 which is normal Vitamin D 32.9  MTHFR Mutation Comment   Comment: (NOTE)  Result:  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 See Additional Clinical Information and  Comments.  Additional Clinical Information:  Hyperhomocysteinemia is multifactorial involving genetic, clinical,  and environmental risk factors. Reduced enzyme activity of  methylenetetrahydrofolate reductase (MTHFR) is a genetic risk  factor for hyperhomocysteinemia, particularly when serum folate  levels are low. There are two common variants in the MTHFR gene  that can decrease enzyme activity; c.665C>T (p. Ala222Val), legacy  name C677T, and c.1286A>C (p. Glu429Ala), legacy name [id number]. These  variants do not independently increase risk of conditions related      Screenings:  [place] Office Visit from 12/31/2020 in [place]  Total GAD-7 Score 0    PHQ2-9   [place] Office Visit from 12/31/2020 in [place] Visit from 07/09/2020 in [place]  PHQ-2 Total Score 0 6  PHQ-9 Total Score -- 16      Receiving Psychotherapy: Yes With Evelena Peat Deussing  Treatment Plan/Recommendations:  PDMP was reviewed. I provided 70 minutes of face-to-face time during this encounter, in which we discussed the diagnosis and treatment of depression.  She prefers not to take any medications if she does not have to.  We talked about therapy lamp and how that can be very beneficial to some people.  Info was given on where to purchase and how to use it.  We also talked about different vitamins and supplements, multivitamin, B complex, vitamin D (even though her level was in the low normal range she needs extra vitamin D for her mental health.  We would like for it to be at least 70) fish oil.  Also discussed the possible need for Deplin.  She may be seeing a hematologist in the future for the low ferritin, so I won't add that in just yet.  We talked about options for medications if she gets worse or decides she would like to try a different med than she has before.  I  would recommend Trintellix.  We discussed the benefits and risk of this medications as well as the side effects.  If she calls in a couple of months and would like to try it, I can prescribe it over the phone.  She verbalizes understanding. Continue Vyvanse 30 mg, 1 p.o. daily per PCP. Continue therapy with Evelena Peat Deussing. Return in 6 months or sooner if needed   [name], PA-C

## 2021-01-05 ENCOUNTER — Encounter: Payer: Self-pay | Admitting: Physician Assistant

## 2021-01-08 ENCOUNTER — Ambulatory Visit (INDEPENDENT_AMBULATORY_CARE_PROVIDER_SITE_OTHER): Payer: BC Managed Care – PPO | Admitting: Family Medicine

## 2021-01-08 ENCOUNTER — Other Ambulatory Visit: Payer: Self-pay

## 2021-01-08 ENCOUNTER — Encounter (INDEPENDENT_AMBULATORY_CARE_PROVIDER_SITE_OTHER): Payer: Self-pay | Admitting: Family Medicine

## 2021-01-08 VITALS — BP 112/76 | HR 80 | Temp 98.2°F | Ht 62.0 in | Wt 174.0 lb

## 2021-01-08 DIAGNOSIS — E669 Obesity, unspecified: Secondary | ICD-10-CM | POA: Diagnosis not present

## 2021-01-08 DIAGNOSIS — Z9189 Other specified personal risk factors, not elsewhere classified: Secondary | ICD-10-CM

## 2021-01-08 DIAGNOSIS — R790 Abnormal level of blood mineral: Secondary | ICD-10-CM

## 2021-01-08 DIAGNOSIS — F5081 Binge eating disorder: Secondary | ICD-10-CM

## 2021-01-08 DIAGNOSIS — F4320 Adjustment disorder, unspecified: Secondary | ICD-10-CM

## 2021-01-08 DIAGNOSIS — Z6832 Body mass index (BMI) 32.0-32.9, adult: Secondary | ICD-10-CM

## 2021-01-09 ENCOUNTER — Encounter (INDEPENDENT_AMBULATORY_CARE_PROVIDER_SITE_OTHER): Payer: Self-pay | Admitting: Family Medicine

## 2021-01-09 MED ORDER — LISDEXAMFETAMINE DIMESYLATE 30 MG PO CAPS: 30.0000 mg | ORAL_CAPSULE | Freq: Every day | ORAL | 0 refills | Status: DC

## 2021-01-09 NOTE — Telephone Encounter (Signed)
Last office visit with Dr. Juleen China.

## 2021-01-09 NOTE — Telephone Encounter (Signed)
LMTCB

## 2021-01-13 NOTE — Progress Notes (Signed)
Chief Complaint:   OBESITY Autumn [name] is here to discuss her progress with her obesity treatment plan along with follow-up of her obesity related diagnoses.   Today's visit was #: 9 Starting weight: 187 lbs Starting date: 07/09/2020 Today's weight: 174 lbs Today's date: 01/08/2021 Total lbs lost to date: 13 lbs Body mass index is 31.83 kg/m.  Total weight loss percentage to date: -6.95%  Interim History: Autumn [name] says Autumn [name] is not has helpful as Vyvanse 30 mg capsules.  She is tolerating iron well.  She says she is on board with therapy and her next appointment is in July.  She is thinking ahead to seasonal affective disorder.  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  She says it is going well. Nutrition Plan: Category 2 Plan for 65% of the time. Activity: She is doing more walking 2-3 times per week.  Assessment/Plan:   1. Low ferritin Autumn [name] will continue taking ferrous sulfate 325 mg every other day.  Lab Results  Component Value Date   FERRITIN 10 (L) 11/11/2020   2. Binge eating disorder People who binge eat feel as if they don't have control over how much they eat and have feelings of guilt or self-loathing after a binge eating episode. Autumn [name] estimates that about 30 percent of adults with binge eating disorder also have a history of ADHD. The FDA has approved Vyvanse as a treatment option for both ADHD and binge eating. Vyvanse targets the brain's reward center by increasing the levels of dopamine and norepinephrine, the chemicals of the brain responsible for feelings of pleasure. Mindful eating is the recommended nutritional approach to treating BED.   - Refill lisdexamfetamine (VYVANSE) 30 MG capsule; Take 1 capsule (30 mg total) by mouth daily.  Dispense: 30 capsule; Refill: 0  3. Adjustment disorder Will continue to monitor symptoms as it pertains to Autumn [name]'s weight loss journey.  4. At risk for depression Autumn [name] was given  approximately 9 minutes of depression prevention counseling today due to their higher than average risk for this condition. The patient has several risk factors for depression such as chronic medical conditions, sleep issues, major life stressors/ events etc., and we discussed these today.  Autumn [name] was also counseled on the importance of a healthy work-life balance, a healthy relationship with food, and a good support system.  We discussed various strategies to help cope with these emotions as well.  I recommended counseling, meditation, healthy eating habits, sleep hygiene, and exercising to help manage these feelings.   5. Class 1 obesity with serious comorbidity and body mass index (BMI) of 32.0 to 32.9 in adult, unspecified obesity type  Course: Autumn [name] is currently in the action stage of change. As such, her goal is to continue with weight loss efforts.   Nutrition goals: She has agreed to the Category 2 Plan.   Exercise goals: For substantial health benefits, adults should do at least 150 minutes (2 hours and 30 minutes) a week of moderate-intensity, or 75 minutes (1 hour and 15 minutes) a week of vigorous-intensity aerobic physical activity, or an equivalent combination of moderate- and vigorous-intensity aerobic activity. Aerobic activity should be performed in episodes of at least 10 minutes, and preferably, it should be spread throughout the week.  Behavioral modification strategies: increasing lean protein intake, decreasing simple carbohydrates, increasing vegetables and increasing water intake.  Autumn [name] has agreed to follow-up with our clinic in 3-4 weeks. She was informed of  the importance of frequent follow-up visits to maximize her success with intensive lifestyle modifications for her multiple health conditions.   Objective:   Blood pressure 112/76, pulse 80, temperature 98.2 F (36.8 C), temperature source Oral, height 5\' 2"  (1.575 m), weight 174 lb (78.9 kg), last  menstrual period 03/20/2020, SpO2 98 %. Body mass index is 31.83 kg/m.  General: Cooperative, alert, well developed, in no acute distress. HEENT: Conjunctivae and lids unremarkable. Cardiovascular: Regular rhythm.  Lungs: Normal work of breathing. Neurologic: No focal deficits.   Lab Results  Component Value Date   CREATININE 0.71 11/11/2020   BUN 12 11/11/2020   NA 139 11/11/2020   K 4.2 11/11/2020   CL 103 11/11/2020   CO2 25 11/11/2020   Lab Results  Component Value Date   ALT 13 11/11/2020   AST 16 11/11/2020   ALKPHOS 83 11/11/2020   BILITOT 0.4 11/11/2020   Lab Results  Component Value Date   TSH 3.960 11/11/2020   Lab Results  Component Value Date   CHOL 197 07/09/2020   HDL 55 07/09/2020   LDLCALC 125 07/09/2020   TRIG 92 07/09/2020   Lab Results  Component Value Date   WBC 7.8 10/10/2020   HGB 12.3 10/10/2020   HCT 38.3 11/11/2020   MCV 90.0 10/10/2020   PLT 245 10/10/2020   Lab Results  Component Value Date   IRON 63 11/11/2020   TIBC 315 11/11/2020   FERRITIN 10 (L) 11/11/2020   Attestation Statements:   Reviewed by clinician on day of visit: allergies, medications, problem list, medical history, surgical history, family history, social history, and previous encounter notes.  I, Water quality scientist, CMA, am acting as transcriptionist for Briscoe Deutscher, DO  I have reviewed the above documentation for accuracy and completeness, and I agree with the above. Briscoe Deutscher, DO

## 2021-01-14 DIAGNOSIS — S300XXA Contusion of lower back and pelvis, initial encounter: Secondary | ICD-10-CM | POA: Diagnosis not present

## 2021-01-14 DIAGNOSIS — M533 Sacrococcygeal disorders, not elsewhere classified: Secondary | ICD-10-CM | POA: Diagnosis not present

## 2021-01-14 DIAGNOSIS — F909 Attention-deficit hyperactivity disorder, unspecified type: Secondary | ICD-10-CM | POA: Diagnosis not present

## 2021-01-14 DIAGNOSIS — F419 Anxiety disorder, unspecified: Secondary | ICD-10-CM | POA: Diagnosis not present

## 2021-01-14 MED ORDER — AMPHETAMINE-DEXTROAMPHET ER 30 MG PO CP24: 30.0000 mg | ORAL_CAPSULE | ORAL | 0 refills | Status: DC

## 2021-01-29 DIAGNOSIS — L409 Psoriasis, unspecified: Secondary | ICD-10-CM | POA: Diagnosis not present

## 2021-01-29 DIAGNOSIS — M533 Sacrococcygeal disorders, not elsewhere classified: Secondary | ICD-10-CM | POA: Diagnosis not present

## 2021-01-29 DIAGNOSIS — K591 Functional diarrhea: Secondary | ICD-10-CM | POA: Diagnosis not present

## 2021-01-29 DIAGNOSIS — L4050 Arthropathic psoriasis, unspecified: Secondary | ICD-10-CM | POA: Diagnosis not present

## 2021-01-29 DIAGNOSIS — R768 Other specified abnormal immunological findings in serum: Secondary | ICD-10-CM | POA: Diagnosis not present

## 2021-02-04 ENCOUNTER — Ambulatory Visit (INDEPENDENT_AMBULATORY_CARE_PROVIDER_SITE_OTHER): Payer: BC Managed Care – PPO | Admitting: Family Medicine

## 2021-02-05 DIAGNOSIS — F419 Anxiety disorder, unspecified: Secondary | ICD-10-CM | POA: Diagnosis not present

## 2021-02-05 DIAGNOSIS — F909 Attention-deficit hyperactivity disorder, unspecified type: Secondary | ICD-10-CM | POA: Diagnosis not present

## 2021-02-12 NOTE — Addendum Note (Signed)
Addended by: Karren Cobble on: 02/12/2021 09:01 AM   Modules accepted: Orders

## 2021-02-13 MED ORDER — AMPHETAMINE-DEXTROAMPHET ER 30 MG PO CP24: 30.0000 mg | ORAL_CAPSULE | ORAL | 0 refills | Status: DC

## 2021-02-18 ENCOUNTER — Other Ambulatory Visit: Payer: Self-pay

## 2021-02-18 ENCOUNTER — Ambulatory Visit (INDEPENDENT_AMBULATORY_CARE_PROVIDER_SITE_OTHER): Payer: BC Managed Care – PPO | Admitting: Family Medicine

## 2021-02-18 ENCOUNTER — Encounter (INDEPENDENT_AMBULATORY_CARE_PROVIDER_SITE_OTHER): Payer: Self-pay | Admitting: Family Medicine

## 2021-02-18 VITALS — BP 110/72 | HR 107 | Temp 98.3°F | Ht 62.0 in | Wt 170.0 lb

## 2021-02-18 DIAGNOSIS — R632 Polyphagia: Secondary | ICD-10-CM

## 2021-02-18 DIAGNOSIS — E669 Obesity, unspecified: Secondary | ICD-10-CM | POA: Diagnosis not present

## 2021-02-18 DIAGNOSIS — R790 Abnormal level of blood mineral: Secondary | ICD-10-CM

## 2021-02-18 DIAGNOSIS — Z6831 Body mass index (BMI) 31.0-31.9, adult: Secondary | ICD-10-CM

## 2021-02-18 DIAGNOSIS — F908 Attention-deficit hyperactivity disorder, other type: Secondary | ICD-10-CM

## 2021-02-18 DIAGNOSIS — Z9189 Other specified personal risk factors, not elsewhere classified: Secondary | ICD-10-CM

## 2021-02-18 DIAGNOSIS — E66811 Obesity, class 1: Secondary | ICD-10-CM

## 2021-02-19 DIAGNOSIS — F419 Anxiety disorder, unspecified: Secondary | ICD-10-CM | POA: Diagnosis not present

## 2021-02-19 DIAGNOSIS — F909 Attention-deficit hyperactivity disorder, unspecified type: Secondary | ICD-10-CM | POA: Diagnosis not present

## 2021-02-25 ENCOUNTER — Other Ambulatory Visit: Payer: Self-pay

## 2021-02-25 ENCOUNTER — Other Ambulatory Visit: Payer: Self-pay | Admitting: Family Medicine

## 2021-02-25 ENCOUNTER — Ambulatory Visit
Admission: RE | Admit: 2021-02-25 | Discharge: 2021-02-25 | Disposition: A | Discharge status: Home / Self Care | Payer: BC Managed Care – PPO | Source: Ambulatory Visit | Attending: Family Medicine | Admitting: Family Medicine

## 2021-02-25 ENCOUNTER — Ambulatory Visit
Admission: RE | Admit: 2021-02-25 | Discharge: 2021-02-25 | Disposition: A | Discharge status: Home / Self Care | Payer: Self-pay | Source: Ambulatory Visit | Attending: Family Medicine | Admitting: Family Medicine

## 2021-02-25 DIAGNOSIS — N6489 Other specified disorders of breast: Secondary | ICD-10-CM | POA: Diagnosis not present

## 2021-02-25 DIAGNOSIS — R922 Inconclusive mammogram: Secondary | ICD-10-CM | POA: Diagnosis not present

## 2021-02-25 DIAGNOSIS — R928 Other abnormal and inconclusive findings on diagnostic imaging of breast: Secondary | ICD-10-CM | POA: Diagnosis not present

## 2021-02-25 NOTE — Progress Notes (Signed)
Chief Complaint:   OBESITY Autumn [name] is here to discuss her progress with her obesity treatment plan along with follow-up of her obesity related diagnoses.   Today's visit was #: 10 Starting weight: 187 lbs Starting date: 07/09/2020 Today's weight: 170 lbs Today's date: 02/18/2021 Total lbs lost to date: 17 lbs Body mass index is 31.09 kg/m.  Total weight loss percentage to date: -9.09%  Interim History:  [name]'s recent labs from [facility] are all normal, she says.  Will will request records.    Current Meal Plan: the Category 2 Plan for 65% of the time.  Current Exercise Plan: Increase walking 3-4 times per week.  Assessment/Plan:   1. Polyphagia Not at goal. Current treatment: None. Polyphagia refers to excessive feelings of hunger. She will continue to focus on protein-rich, low simple carbohydrate foods. We reviewed the importance of hydration, regular exercise for stress reduction, and restorative sleep.  We discussed considering phentermine 8-15 mg twice daily instead of Adderall.  2. Low ferritin She is taking ferrous sulfate 325 mg daily.  Plan:  Continue ferrous sulfate 325 mg daily.  3. Attention deficit hyperactivity disorder (ADHD), other type Taking Adderall 30 mg daily.  Plan:  Continue Adderall.  Will likely decrease dose with next prescription.   4. At risk for impaired metabolic function Due to Autumn [name]'s current state of health and medical condition(s), she is at a significantly higher risk for impaired metabolic function. At least 8 minutes was spent on counseling Autumn [name] about these concerns today.  This places the patient at a much greater risk to subsequently develop cardio-pulmonary conditions that can negatively affect the patient's quality of life.  I stressed the importance of reversing these risks factors.  The initial goal is to lose at least 5-10% of starting weight to help reduce risk factors.  Counseling:  Intensive lifestyle  modifications discussed with Autumn [name] as the most appropriate first line treatment.   5. Class 1 obesity with serious comorbidity and body mass index (BMI) of 31.0 to 31.9 in adult, unspecified obesity type  Course: Autumn [name] is currently in the action stage of change. As such, her goal is to continue with weight loss efforts.   Nutrition goals: She has agreed to the Category 2 Plan.   Exercise goals: For substantial health benefits, adults should do at least 150 minutes (2 hours and 30 minutes) a week of moderate-intensity, or 75 minutes (1 hour and 15 minutes) a week of vigorous-intensity aerobic physical activity, or an equivalent combination of moderate- and vigorous-intensity aerobic activity. Aerobic activity should be performed in episodes of at least 10 minutes, and preferably, it should be spread throughout the week.  Behavioral modification strategies: increasing lean protein intake, decreasing simple carbohydrates, increasing vegetables and increasing water intake.  Autumn [name] has agreed to follow-up with our clinic in 4-6 weeks. She was informed of the importance of frequent follow-up visits to maximize her success with intensive lifestyle modifications for her multiple health conditions.   Objective:   Blood pressure 110/72, pulse (!) 107, temperature 98.3 F (36.8 C), temperature source Oral, height 5\' 2"  (1.575 m), weight 170 lb (77.1 kg), last menstrual period 03/20/2020, SpO2 97 %. Body mass index is 31.09 kg/m.  General: Cooperative, alert, well developed, in no acute distress. HEENT: Conjunctivae and lids unremarkable. Cardiovascular: Regular rhythm.  Lungs: Normal work of breathing. Neurologic: No focal deficits.   Lab Results  Component Value Date   CREATININE 0.71 11/11/2020   BUN 12 11/11/2020  NA 139 11/11/2020   K 4.2 11/11/2020   CL 103 11/11/2020   CO2 25 11/11/2020   Lab Results  Component Value Date   ALT 13 11/11/2020   AST 16 11/11/2020   ALKPHOS 83  11/11/2020   BILITOT 0.4 11/11/2020   Lab Results  Component Value Date   TSH 3.960 11/11/2020   Lab Results  Component Value Date   CHOL 197 07/09/2020   HDL 55 07/09/2020   LDLCALC 125 07/09/2020   TRIG 92 07/09/2020   Lab Results  Component Value Date   WBC 7.8 10/10/2020   HGB 12.3 10/10/2020   HCT 38.3 11/11/2020   MCV 90.0 10/10/2020   PLT 245 10/10/2020   Lab Results  Component Value Date   IRON 63 11/11/2020   TIBC 315 11/11/2020   FERRITIN 10 (L) 11/11/2020   Attestation Statements:   Reviewed by clinician on day of visit: allergies, medications, problem list, medical history, surgical history, family history, social history, and previous encounter notes.  I, Water quality scientist, CMA, am acting as transcriptionist for Briscoe Deutscher, DO  I have reviewed the above documentation for accuracy and completeness, and I agree with the above. Briscoe Deutscher, DO

## 2021-03-11 DIAGNOSIS — F419 Anxiety disorder, unspecified: Secondary | ICD-10-CM | POA: Diagnosis not present

## 2021-03-11 DIAGNOSIS — Z79899 Other long term (current) drug therapy: Secondary | ICD-10-CM | POA: Diagnosis not present

## 2021-03-11 DIAGNOSIS — F338 Other recurrent depressive disorders: Secondary | ICD-10-CM | POA: Diagnosis not present

## 2021-03-11 DIAGNOSIS — F900 Attention-deficit hyperactivity disorder, predominantly inattentive type: Secondary | ICD-10-CM | POA: Diagnosis not present

## 2021-03-12 DIAGNOSIS — D2261 Melanocytic nevi of right upper limb, including shoulder: Secondary | ICD-10-CM | POA: Diagnosis not present

## 2021-03-12 DIAGNOSIS — D2262 Melanocytic nevi of left upper limb, including shoulder: Secondary | ICD-10-CM | POA: Diagnosis not present

## 2021-03-12 DIAGNOSIS — Z85820 Personal history of malignant melanoma of skin: Secondary | ICD-10-CM | POA: Diagnosis not present

## 2021-03-12 DIAGNOSIS — D225 Melanocytic nevi of trunk: Secondary | ICD-10-CM | POA: Diagnosis not present

## 2021-03-19 ENCOUNTER — Encounter (INDEPENDENT_AMBULATORY_CARE_PROVIDER_SITE_OTHER): Payer: Self-pay | Admitting: Family Medicine

## 2021-03-19 NOTE — Telephone Encounter (Signed)
Dr.Wallace °

## 2021-04-01 ENCOUNTER — Telehealth (INDEPENDENT_AMBULATORY_CARE_PROVIDER_SITE_OTHER): Payer: BC Managed Care – PPO | Admitting: Family Medicine

## 2021-04-08 ENCOUNTER — Other Ambulatory Visit: Payer: Self-pay

## 2021-04-08 ENCOUNTER — Encounter (INDEPENDENT_AMBULATORY_CARE_PROVIDER_SITE_OTHER): Payer: Self-pay | Admitting: Family Medicine

## 2021-04-08 ENCOUNTER — Ambulatory Visit (INDEPENDENT_AMBULATORY_CARE_PROVIDER_SITE_OTHER): Payer: BC Managed Care – PPO | Admitting: Family Medicine

## 2021-04-08 VITALS — BP 112/69 | HR 100 | Temp 98.2°F | Ht 62.0 in | Wt 167.0 lb

## 2021-04-08 DIAGNOSIS — Z9189 Other specified personal risk factors, not elsewhere classified: Secondary | ICD-10-CM

## 2021-04-08 DIAGNOSIS — E669 Obesity, unspecified: Secondary | ICD-10-CM

## 2021-04-08 DIAGNOSIS — Z6834 Body mass index (BMI) 34.0-34.9, adult: Secondary | ICD-10-CM

## 2021-04-08 DIAGNOSIS — G43809 Other migraine, not intractable, without status migrainosus: Secondary | ICD-10-CM | POA: Diagnosis not present

## 2021-04-08 DIAGNOSIS — F908 Attention-deficit hyperactivity disorder, other type: Secondary | ICD-10-CM | POA: Diagnosis not present

## 2021-04-09 MED ORDER — UBRELVY 100 MG PO TABS: 100.0000 mg | ORAL_TABLET | Freq: Every day | ORAL | 0 refills | Status: DC | PRN

## 2021-04-15 ENCOUNTER — Encounter (HOSPITAL_BASED_OUTPATIENT_CLINIC_OR_DEPARTMENT_OTHER): Payer: Self-pay | Admitting: Obstetrics & Gynecology

## 2021-04-15 ENCOUNTER — Other Ambulatory Visit (HOSPITAL_BASED_OUTPATIENT_CLINIC_OR_DEPARTMENT_OTHER): Payer: Self-pay

## 2021-04-15 ENCOUNTER — Other Ambulatory Visit (HOSPITAL_BASED_OUTPATIENT_CLINIC_OR_DEPARTMENT_OTHER)
Admission: RE | Admit: 2021-04-15 | Discharge: 2021-04-15 | Disposition: A | Discharge status: Home / Self Care | Payer: BC Managed Care – PPO | Source: Ambulatory Visit | Attending: Obstetrics & Gynecology | Admitting: Obstetrics & Gynecology

## 2021-04-15 ENCOUNTER — Other Ambulatory Visit: Payer: Self-pay

## 2021-04-15 ENCOUNTER — Ambulatory Visit (INDEPENDENT_AMBULATORY_CARE_PROVIDER_SITE_OTHER): Payer: BC Managed Care – PPO | Admitting: Obstetrics & Gynecology

## 2021-04-15 VITALS — BP 113/80 | HR 82 | Ht 62.0 in | Wt 172.8 lb

## 2021-04-15 DIAGNOSIS — N39498 Other specified urinary incontinence: Secondary | ICD-10-CM | POA: Insufficient documentation

## 2021-04-15 DIAGNOSIS — R1031 Right lower quadrant pain: Secondary | ICD-10-CM | POA: Diagnosis not present

## 2021-04-15 DIAGNOSIS — Z800 Family history of malignant neoplasm of digestive organs: Secondary | ICD-10-CM | POA: Insufficient documentation

## 2021-04-15 DIAGNOSIS — Z90710 Acquired absence of both cervix and uterus: Secondary | ICD-10-CM

## 2021-04-15 NOTE — Progress Notes (Signed)
GYNECOLOGY  VISIT  CC:   Pelvic pain  HPI: 45 y.o. G66P2002 Married White or Caucasian female here for complaint of pelvic pain that started two weeks ago.  She thinks maybe she lifted something that caused this.  It does come and go.  Also, she did have a single episode of urinary urgency about a week ago.  She reports she had been feeling like she needed to void for a while but didn't go.  Then when the urgency became more significant, she started to leak and then had an accident.  This hasn't happened since before her surgery on 04/08/2020.  Typically, she doesn't really leak at all.  Does not typically have urgency either.  Denies vaginal bleeding.  Wants to make sure she does not have any recurring prolapse.  Denies painful intercourse.  Denies vaginal bleeding.  GYNECOLOGIC HISTORY: Patient's last menstrual period was 03/20/2020. Contraception: hysterectomy Menopausal hormone therapy: none  Patient Active Problem List   Diagnosis Date Noted  . Trochanteric bursitis of left hip 01/12/2020  . Patellofemoral arthritis of right knee 05/11/2018  . Lumbar radiculopathy 03/22/2018  . Nonallopathic lesion of thoracic region 10/07/2017  . Nonallopathic lesion of cervical region 10/07/2017  . Nonallopathic lesion of lumbosacral region 10/07/2017  . Piriformis syndrome of right side 08/24/2017  . Polyarthropathy or polyarthritis of multiple sites 08/24/2017  . 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 Mild episode of recurrent major depressive disorder ([place]) 02/16/2017  . Attention deficit hyperactivity disorder (ADHD) 02/16/2017  . Psoriasis 02/16/2017  . Heterozygous factor V Leiden mutation ([place]) 09/01/2016  . Nephrolithiasis 06/07/2012  . Headache, menstrual migraine 06/07/2012  . Malignant melanoma ([place]) 06/07/2012    Past Medical History:  Diagnosis Date  . Anemia   . Anxiety   . Attention deficit hyperactivity disorder (ADHD) 02/16/2017   Sees [facility] for managment  . Back pain   . Calculus of kidney 06/07/2012   Overview:  Dr Karsten Ro - urology   . Cancer ([place])    melanoma removed from abdomen in 2000  . Complication of anesthesia    low BP during C-section  . Depression   . Factor V Leiden carrier ([place])    heterozygous  . Hernia, rectovaginal 06/07/2012   Overview:  Dr Corinna Capra - gyn   . History of abnormal cervical Pap smear 1997   dysplasia- LEEP  . History of reconstructive repair of rectocele   . IBS (irritable bowel syndrome)   . Joint pain   . Lower extremity edema   . Migraine   . Ovarian cyst 1994  . Psoriasis 02/16/2017   -sees dermatologist  . Psoriatic arthritis [facility])     Past Surgical History:  Procedure Laterality Date  . BLADDER SUSPENSION N/A 04/08/2020   Procedure: POSSIBLE TRANSVAGINAL TAPE (TVT) PROCEDURE MIDURETHAL SLING;  Surgeon: Nunzio Cobbs, MD;  Location: [facility];  Service: Gynecology;  Laterality: N/A;  . CERVICAL BIOPSY  W/ LOOP ELECTRODE EXCISION    . CESAREAN SECTION  2007 & 2010  . CYSTOSCOPY N/A 04/08/2020   Procedure: CYSTOSCOPY;  Surgeon: Nunzio Cobbs, MD;  Location: [facility];  Service: Gynecology;  Laterality: N/A;  . KNEE ARTHROPLASTY Right 1989  . OVARIAN CYST SURGERY  1994  . RECTOCELE REPAIR N/A 04/08/2020   Procedure: POSTERIOR REPAIR (RECTOCELE) WITH NATIVE TISSUE REPAIR;  Surgeon: Nunzio Cobbs, MD;  Location[facility];  Service: Gynecology;  Laterality: N/A;  . SINUS ENDO WITH FUSION    . TOTAL LAPAROSCOPIC HYSTERECTOMY WITH SALPINGECTOMY N/A 04/08/2020   Procedure: TOTAL LAPAROSCOPIC HYSTERECTOMY WITH BILATERAL SALPINGECTOMY;  Surgeon: Megan Salon, MD;  Location: [facility];  Service: Gynecology;  Laterality: N/A;  . TUBAL LIGATION  2010  . VULVA /PERINEUM BIOPSY  01/2015    MEDS:   Current Outpatient Medications on File Prior to Visit  Medication Sig Dispense Refill  .  amphetamine-dextroamphetamine (ADDERALL XR) 25 MG 24 hr capsule Take by mouth daily.    . Multiple Vitamin (MULTIVITAMIN WITH MINERALS) TABS tablet Take 1 tablet by mouth daily.    . Secukinumab (COSENTYX SENSOREADY PEN) 150 MG/ML SOAJ Inject 300 mg into the skin every 30 (thirty) days.     Marland Kitchen Ubrogepant (UBRELVY) 100 MG TABS Take 100 mg by mouth daily as needed (migraine). 30 tablet 0  . ferrous sulfate 325 (65 FE) MG tablet Take 325 mg by mouth daily with breakfast. (Patient not taking: Reported on 04/15/2021)    . UNABLE TO FIND julva external vaginal moisturizer (Patient not taking: Reported on 04/15/2021)     No current facility-administered medications on file prior to visit.    ALLERGIES: Patient has no known allergies.  Family History  Problem Relation Age of Onset  . Hyperlipidemia Mother   . Hyperlipidemia Father   . Cancer Father        throat  . Esophageal cancer Maternal Uncle   . Rectal cancer Maternal Uncle   . Diabetes Maternal Grandmother   . Heart disease Maternal Grandmother   . Breast cancer Maternal Aunt 76       stage 1  . Emphysema Maternal Grandfather   . Emphysema Paternal Grandfather   . Healthy Paternal Grandmother   . Healthy Son   . Healthy Son     SH:  Married, non smoker  Review of Systems  Constitutional: Negative.   Gastrointestinal: Positive for abdominal pain.  Genitourinary: Positive for pelvic pain. Negative for vaginal bleeding, vaginal discharge and vaginal pain.    PHYSICAL EXAMINATION:    BP 113/80 (BP Location: Right Arm, Patient Position: Sitting, Cuff Size: Small)   Pulse 82   Ht 5\' 2"  (1.575 m) Comment: reported  Wt 172 lb 12.8 oz (78.4 kg)   LMP 03/20/2020   BMI 31.61 kg/m     General appearance: alert, cooperative and appears stated age Abdomen: soft, pain located along right lateral lower abdomen where prior cesarean section scar is location, bowel sounds normal; no masses,  no organomegaly Lymph:  no inguinal LAD  noted  Pelvic: External genitalia:  no lesions              Urethra:  normal appearing urethra with no masses, tenderness or lesions              Bartholins and Skenes: normal                 Vagina: normal appearing vagina with normal color and discharge, no lesions, good support present              Cervix: absent              Bimanual Exam:  Uterus:  uterus absent              Adnexa: no mass, fullness, tenderness  Chaperone, Octaviano Batty, CMA, was present for exam.  Assessment/Plan: 1. Right lower quadrant abdominal pain - d/w pt  this is along prior cesarean section scar and is not likely related to surgery a year ago.  Incisional hernia is more likely.  Anti-inflammatories and heat recommended.  If continues, will need to proceed with CT abd/pelvis for better identification of possible hernia.  Pt will give me update in two weeks if still present.  2. Other urinary incontinence - Urine Culture; Future  3. History of hysterectomy, TVT, posterior repair - no appearance of recurrence of prolapse present today on exam.  Pt very reassured.  Total time for visit with pt, discussion of findings, possible additional evaluation and documentation:  22 minutes.

## 2021-04-15 NOTE — Progress Notes (Signed)
Chief Complaint:   OBESITY Autumn [name] is here to discuss her progress with her obesity treatment plan along with follow-up of her obesity related diagnoses.   Today's visit was #: 11 Starting weight: 187 lbs Starting date: 07/09/2020 Today's weight: 167 lbs Today's date: 04/08/2021 Total lbs lost to date: 20 lbs Body mass index is 30.54 kg/m.  Total weight loss percentage to date: -10.70%  Interim History:  Autumn [name] says she has been having more migraines and has TMJ.  She says she needs a new prescription for Ubrelvy.  Current Meal Plan: the Category 2 Plan for 65% of the time.  Current Exercise Plan: Increased walking.  Assessment/Plan:   1. Other migraine without status migrainosus, not intractable Autumn [name] has been having more migraines recently.  She requests a new prescription for Ubrelvy.  Plan:  Will send in new prescription for Ubrelvy.  Will also place referral to Neurology today.  - Ambulatory referral to Neurology - Refill Ubrogepant (UBRELVY) 100 MG TABS; Take 100 mg by mouth daily as needed (migraine).  Dispense: 30 tablet; Refill: 0  2. Attention deficit hyperactivity disorder (ADHD), other type Autumn [name] says that Adderall 25 mg daily is working well for her.  Plan:  Continue Adderall.  3. At risk for heart disease Due to Myda's current state of health and medical condition(s), she is at a higher risk for heart disease.  This puts the patient at much greater risk to subsequently develop cardiopulmonary conditions that can significantly affect patient's quality of life in a negative manner.    At least 9 minutes were spent on counseling Autumn [name] about these concerns today. Evidence-based interventions for health behavior change were utilized today including the discussion of self monitoring techniques, problem-solving barriers, and SMART goal setting techniques.  Specifically, regarding patient's less desirable eating habits and patterns, we employed the technique of  small changes when Autumn [name] has not been able to fully commit to her prudent nutritional plan.  4. Obesity, current BMI 30.7  Course: Autumn [name] is currently in the action stage of change. As such, her goal is to continue with weight loss efforts.   Nutrition goals: She has agreed to the Category 2 Plan.   Exercise goals: For substantial health benefits, adults should do at least 150 minutes (2 hours and 30 minutes) a week of moderate-intensity, or 75 minutes (1 hour and 15 minutes) a week of vigorous-intensity aerobic physical activity, or an equivalent combination of moderate- and vigorous-intensity aerobic activity. Aerobic activity should be performed in episodes of at least 10 minutes, and preferably, it should be spread throughout the week.  Behavioral modification strategies: increasing lean protein intake, decreasing simple carbohydrates, increasing vegetables and increasing water intake.  [name] has agreed to follow-up with our clinic in 4 weeks. She was informed of the importance of frequent follow-up visits to maximize her success with intensive lifestyle modifications for her multiple health conditions.   Objective:   Blood pressure 112/69, pulse 100, temperature 98.2 F (36.8 C), temperature source Oral, height 5\' 2"  (1.575 m), weight 167 lb (75.8 kg), last menstrual period 03/20/2020, SpO2 100 %. Body mass index is 30.54 kg/m.  General: Cooperative, alert, well developed, in no acute distress. HEENT: Conjunctivae and lids unremarkable. Cardiovascular: Regular rhythm.  Lungs: Normal work of breathing. Neurologic: No focal deficits.   Lab Results  Component Value Date   CREATININE 0.71 11/11/2020   BUN 12 11/11/2020   NA 139 11/11/2020   K 4.2 11/11/2020   CL 103 11/11/2020  CO2 25 11/11/2020   Lab Results  Component Value Date   ALT 13 11/11/2020   AST 16 11/11/2020   ALKPHOS 83 11/11/2020   BILITOT 0.4 11/11/2020   Lab Results  Component Value Date   TSH 3.960  11/11/2020   Lab Results  Component Value Date   CHOL 197 07/09/2020   HDL 55 07/09/2020   LDLCALC 125 07/09/2020   TRIG 92 07/09/2020   Lab Results  Component Value Date   WBC 7.8 10/10/2020   HGB 12.3 10/10/2020   HCT 38.3 11/11/2020   MCV 90.0 10/10/2020   PLT 245 10/10/2020   Lab Results  Component Value Date   IRON 63 11/11/2020   TIBC 315 11/11/2020   FERRITIN 10 (L) 11/11/2020   Attestation Statements:   Reviewed by clinician on day of visit: allergies, medications, problem list, medical history, surgical history, family history, social history, and previous encounter notes.  I, Water quality scientist, CMA, am acting as transcriptionist for Briscoe Deutscher, DO  I have reviewed the above documentation for accuracy and completeness, and I agree with the above. Briscoe Deutscher, DO

## 2021-04-18 LAB — URINE CULTURE: Culture: NO GROWTH

## 2021-04-20 DIAGNOSIS — Z90710 Acquired absence of both cervix and uterus: Secondary | ICD-10-CM | POA: Insufficient documentation

## 2021-04-22 ENCOUNTER — Encounter (INDEPENDENT_AMBULATORY_CARE_PROVIDER_SITE_OTHER): Payer: Self-pay | Admitting: Family Medicine

## 2021-04-22 DIAGNOSIS — J40 Bronchitis, not specified as acute or chronic: Secondary | ICD-10-CM | POA: Diagnosis not present

## 2021-04-22 DIAGNOSIS — J301 Allergic rhinitis due to pollen: Secondary | ICD-10-CM | POA: Diagnosis not present

## 2021-04-22 NOTE — Telephone Encounter (Signed)
Pt last seen by Dr. Wallace.  

## 2021-04-23 MED ORDER — AMPHETAMINE-DEXTROAMPHET ER 25 MG PO CP24: 25.0000 mg | ORAL_CAPSULE | Freq: Every day | ORAL | 0 refills | Status: DC

## 2021-04-27 IMAGING — MG DIGITAL SCREENING BILAT W/ TOMO W/ CAD
8 series · 9 of 24 positions shown · non-contrast
Comparison: Previous exam(s).

CLINICAL DATA: Screening.

EXAM:
DIGITAL SCREENING BILATERAL MAMMOGRAM WITH TOMO AND CAD

[R MLO synth-2D]
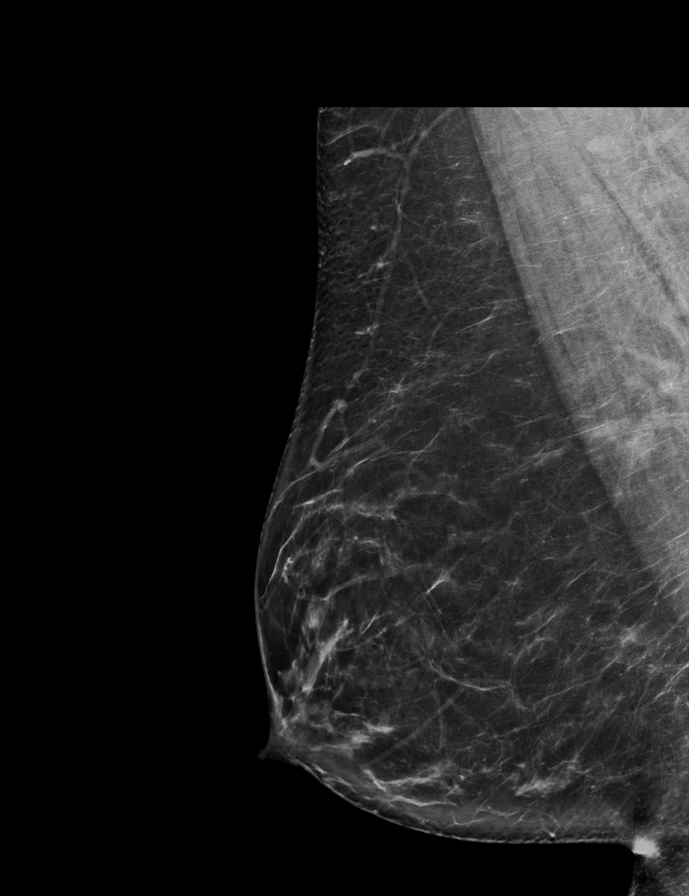

[L CC synth-2D]
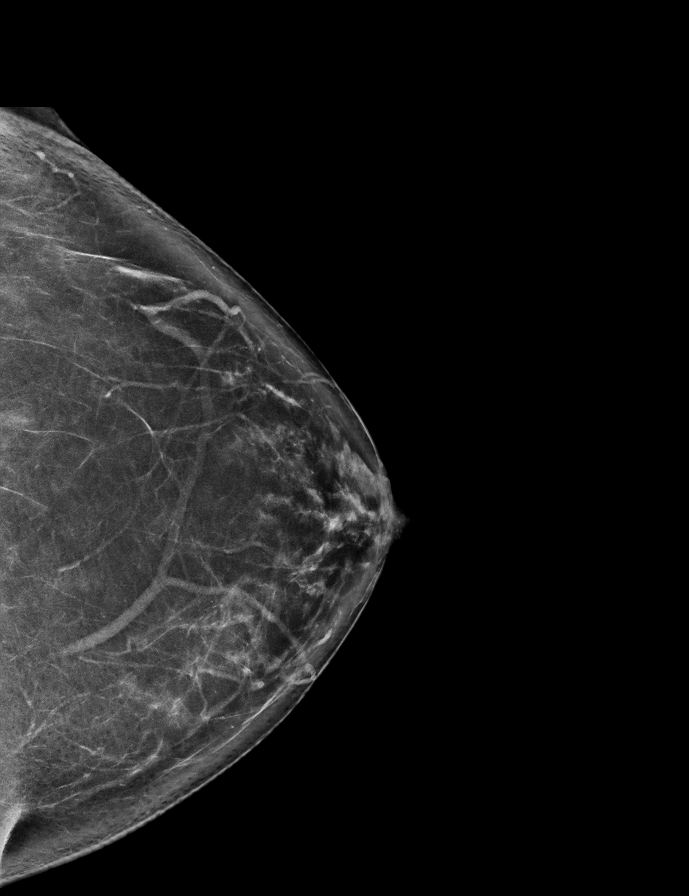

[L MLO synth-2D]
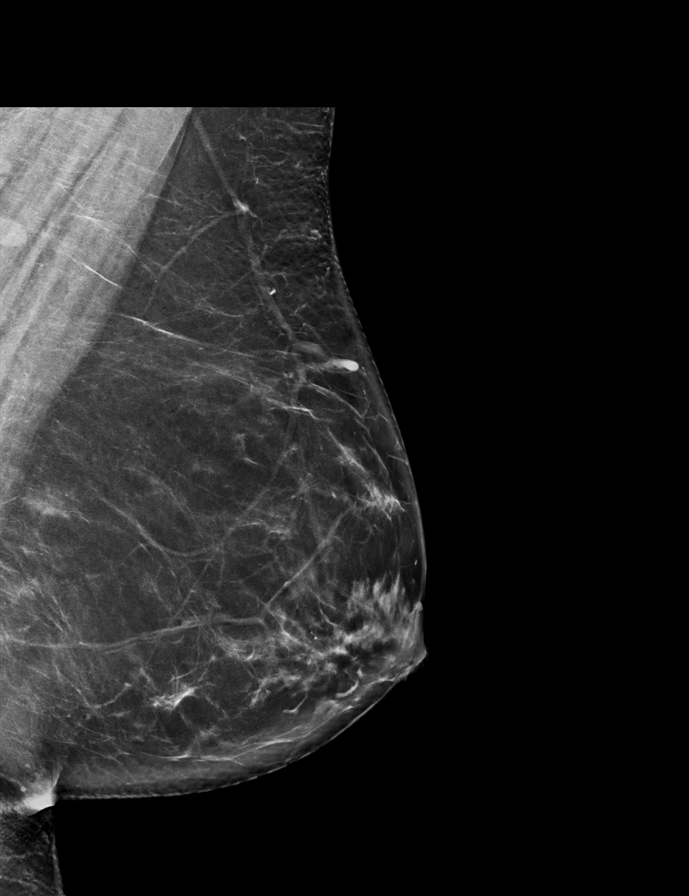

[R CC synth-2D]
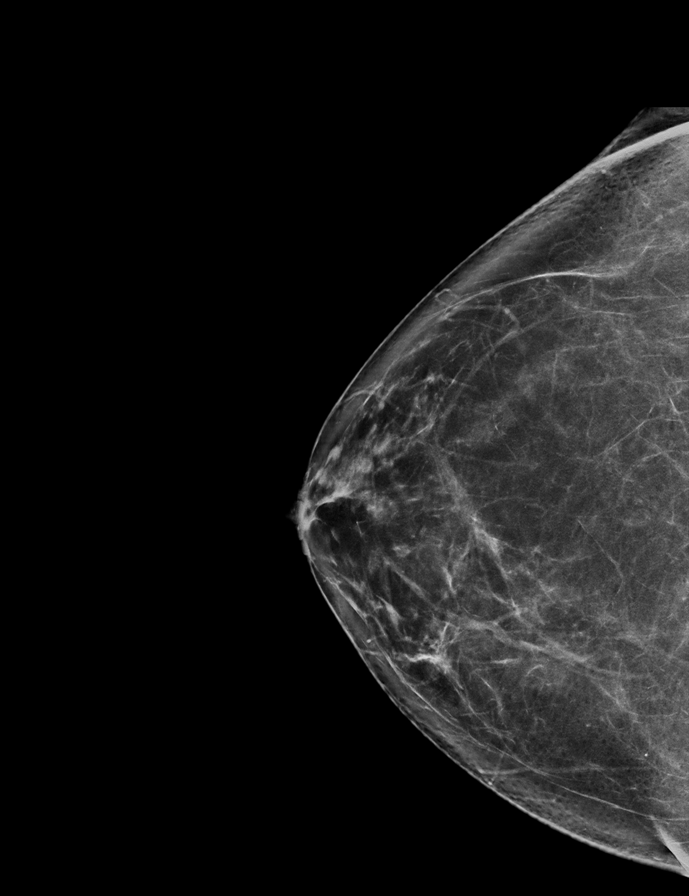

[L MLO tomo · 2 of 77 frames shown]
[frame 25/77]
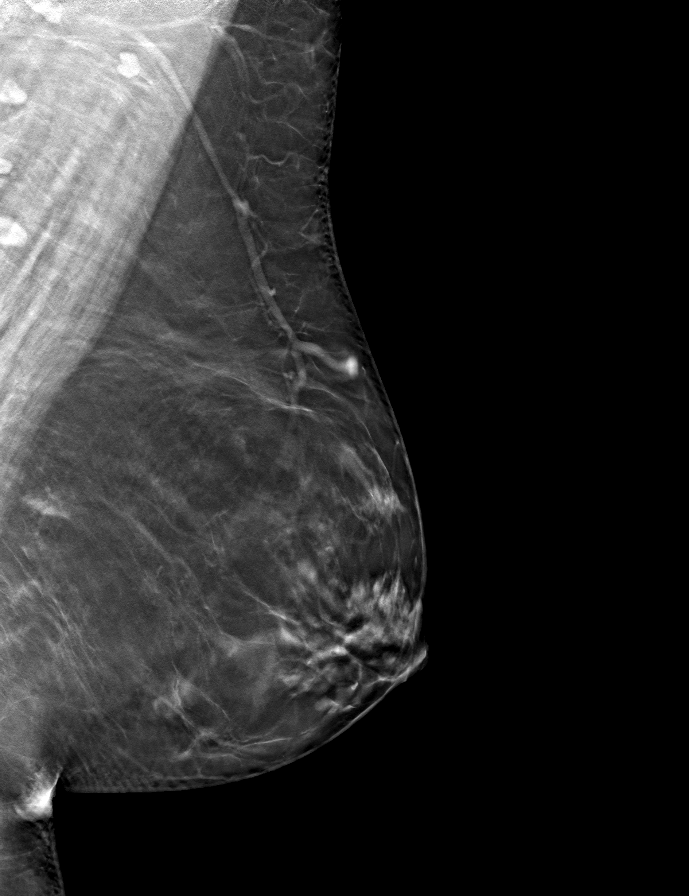
[frame 39/77]
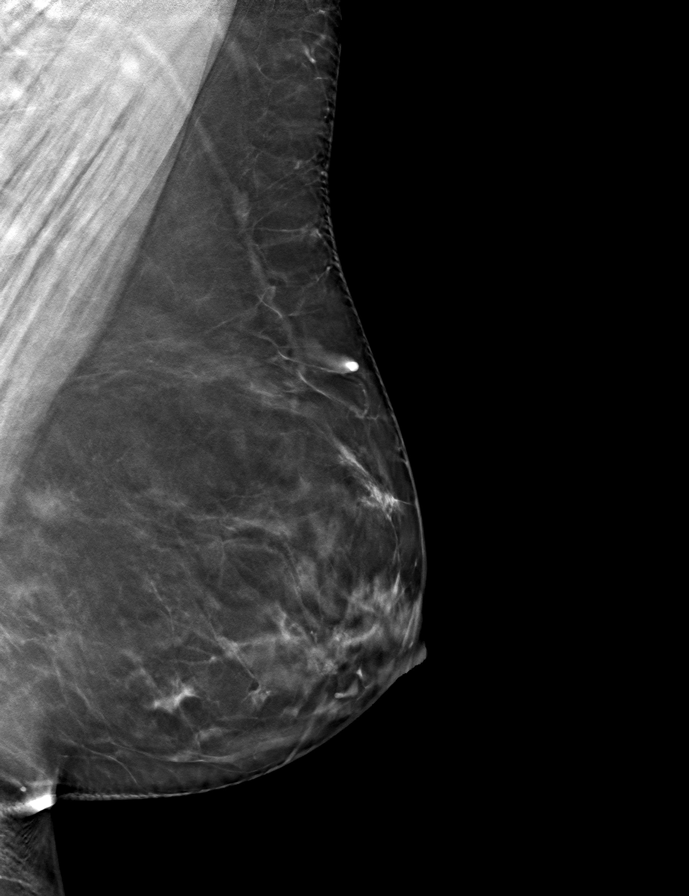

[R CC tomo · tomo slice 38/75.0]
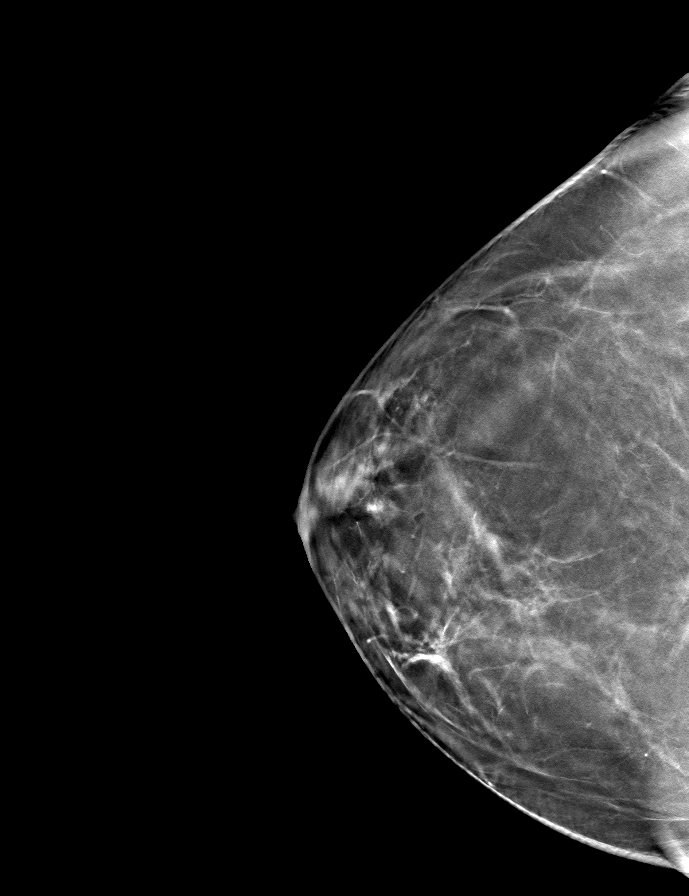

[R MLO tomo · tomo slice 41/80.0]
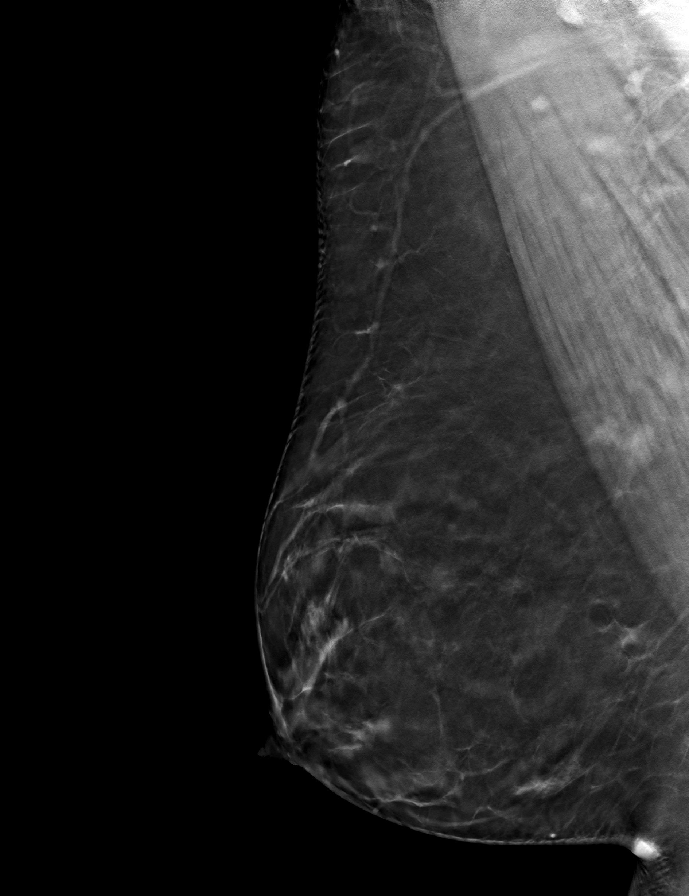

[L CC tomo · tomo slice 37/73.0]
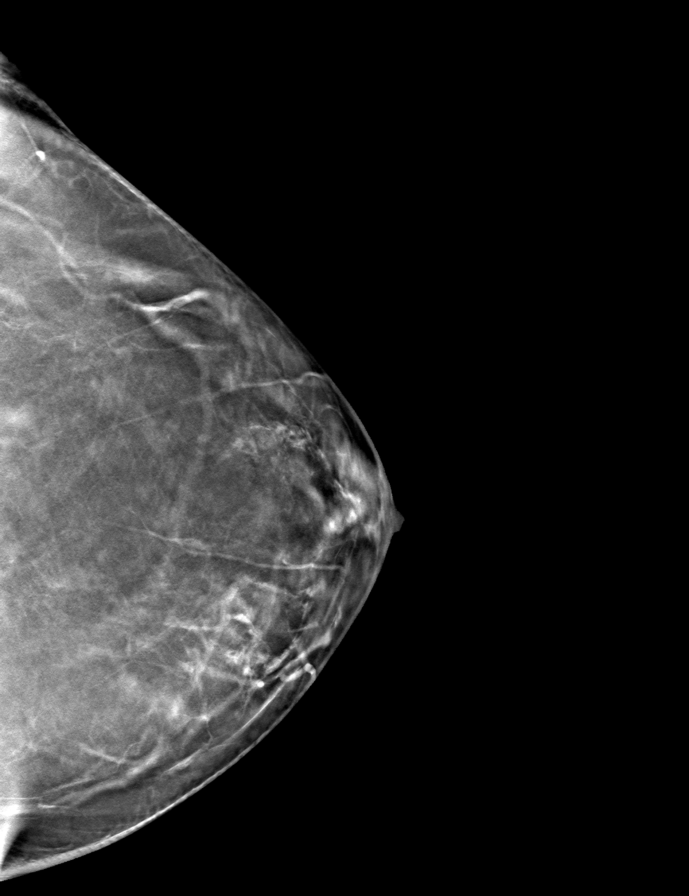

[9 of 24 positions shown; findings below may reference images not displayed]

ACR Breast Density Category b: There are scattered areas of
fibroglandular density.
FINDINGS: In the right breast, a possible asymmetry seen only on the MLO
images warrants further evaluation. In the left breast, no findings
suspicious for malignancy. Images were processed with CAD.
IMPRESSION: Further evaluation is suggested for possible asymmetry in the right
breast.

RECOMMENDATION:
Diagnostic mammogram and possibly ultrasound of the right breast.
(Code:[id number]-[id number])

The patient will be contacted regarding the findings, and additional
imaging will be scheduled.

BI-RADS CATEGORY  0: Incomplete. Need additional imaging evaluation
and/or prior mammograms for comparison.

## 2021-05-12 ENCOUNTER — Encounter (INDEPENDENT_AMBULATORY_CARE_PROVIDER_SITE_OTHER): Payer: Self-pay | Admitting: Family Medicine

## 2021-05-12 ENCOUNTER — Ambulatory Visit (INDEPENDENT_AMBULATORY_CARE_PROVIDER_SITE_OTHER): Payer: BC Managed Care – PPO | Admitting: Family Medicine

## 2021-05-12 ENCOUNTER — Other Ambulatory Visit: Payer: Self-pay

## 2021-05-12 VITALS — BP 102/67 | HR 80 | Temp 97.9°F | Ht 62.0 in | Wt 167.0 lb

## 2021-05-12 DIAGNOSIS — L4050 Arthropathic psoriasis, unspecified: Secondary | ICD-10-CM | POA: Diagnosis not present

## 2021-05-12 DIAGNOSIS — R632 Polyphagia: Secondary | ICD-10-CM | POA: Diagnosis not present

## 2021-05-12 DIAGNOSIS — Z9189 Other specified personal risk factors, not elsewhere classified: Secondary | ICD-10-CM | POA: Diagnosis not present

## 2021-05-12 DIAGNOSIS — F908 Attention-deficit hyperactivity disorder, other type: Secondary | ICD-10-CM

## 2021-05-12 DIAGNOSIS — E669 Obesity, unspecified: Secondary | ICD-10-CM | POA: Diagnosis not present

## 2021-05-12 DIAGNOSIS — Z6834 Body mass index (BMI) 34.0-34.9, adult: Secondary | ICD-10-CM

## 2021-05-20 NOTE — Progress Notes (Signed)
Chief Complaint:   OBESITY Autumn [name] is here to discuss her progress with her obesity treatment plan along with follow-up of her obesity related diagnoses.   Today's visit was #: 12 Starting weight: 187 lbs Starting date: 07/09/2020 Today's weight: 167 lbs Today's date: 05/12/2021 Weight change since last visit: 0 Total lbs lost to date: 20 lbs Body mass index is 30.54 kg/m.  Total weight loss percentage to date: -10.70%  Interim History: Autumn [name] says she has been having increased hunger and increased pain. We discussed the possibilities of trial Qsymia v GLP1RA and OMT.   Current Meal Plan: the Category 2 Plan for 60% of the time.  Current Exercise Plan: None.  Assessment/Plan:   1. Polyphagia Worsening. Current treatment: None. Polyphagia refers to excessive feelings of hunger. She will continue to focus on protein-rich, low simple carbohydrate foods. We reviewed the importance of hydration, regular exercise for stress reduction, and restorative sleep.  2. Psoriatic arthritis (HCC) Autumn [name] has psoriatic arthritis. Reviewed recent visit with Rheumatology and plan. We will continue to monitor symptoms as they relate to her weight loss journey.  3. Attention deficit hyperactivity disorder (ADHD) Autumn [name] takes Adderall 25 mg daily for ADHD. The current medical regimen is effective;  continue present plan and medications. We will continue to monitor symptoms as they relate to her weight loss journey.  4. At risk for activity intolerance Autumn [name] is at risk for activity intolerance due to increased pain from psoriatic arthritis.Autumn [name] was given approximately 9 minutes of counseling today regarding her increased risk for exercise intolerance.  We discussed patient's specific personal and medical issues that raise our concern.    5. Obesity, current BMI 30.7  Course: Autumn [name] is currently in the action stage of change. As such, her goal is to continue with weight loss efforts.    Nutrition goals: She has agreed to the Category 2 Plan.   Exercise goals:  Walk the dog for 10 minutes 2-3 days per week.  Behavioral modification strategies: increasing lean protein intake, decreasing simple carbohydrates, increasing vegetables, and increasing water intake.  Autumn [name] has agreed to follow-up with our clinic in 4 weeks. She was informed of the importance of frequent follow-up visits to maximize her success with intensive lifestyle modifications for her multiple health conditions.   Objective:   Blood pressure 102/67, pulse 80, temperature 97.9 F (36.6 C), temperature source Oral, height 5\' 2"  (1.575 m), weight 167 lb (75.8 kg), last menstrual period 03/20/2020, SpO2 100 %. Body mass index is 30.54 kg/m.  General: Cooperative, alert, well developed, in no acute distress. HEENT: Conjunctivae and lids unremarkable. Cardiovascular: Regular rhythm.  Lungs: Normal work of breathing. Neurologic: No focal deficits.   Lab Results  Component Value Date   CREATININE 0.71 11/11/2020   BUN 12 11/11/2020   NA 139 11/11/2020   K 4.2 11/11/2020   CL 103 11/11/2020   CO2 25 11/11/2020   Lab Results  Component Value Date   ALT 13 11/11/2020   AST 16 11/11/2020   ALKPHOS 83 11/11/2020   BILITOT 0.4 11/11/2020   Lab Results  Component Value Date   TSH 3.960 11/11/2020   Lab Results  Component Value Date   CHOL 197 07/09/2020   HDL 55 07/09/2020   LDLCALC 125 07/09/2020   TRIG 92 07/09/2020   Lab Results  Component Value Date   WBC 7.8 10/10/2020   HGB 12.3 10/10/2020   HCT 38.3 11/11/2020   MCV 90.0 10/10/2020   PLT  245 10/10/2020   Lab Results  Component Value Date   IRON 63 11/11/2020   TIBC 315 11/11/2020   FERRITIN 10 (L) 11/11/2020   Attestation Statements:   Reviewed by clinician on day of visit: allergies, medications, problem list, medical history, surgical history, family history, social history, and previous encounter notes.  I, Water quality scientist,  CMA, am acting as transcriptionist for Briscoe Deutscher, DO  I have reviewed the above documentation for accuracy and completeness, and I agree with the above. Briscoe Deutscher, DO

## 2021-05-26 ENCOUNTER — Other Ambulatory Visit (INDEPENDENT_AMBULATORY_CARE_PROVIDER_SITE_OTHER): Payer: Self-pay | Admitting: Family Medicine

## 2021-05-27 MED ORDER — AMPHETAMINE-DEXTROAMPHET ER 25 MG PO CP24: 25.0000 mg | ORAL_CAPSULE | Freq: Every day | ORAL | 0 refills | Status: DC

## 2021-06-16 ENCOUNTER — Encounter (INDEPENDENT_AMBULATORY_CARE_PROVIDER_SITE_OTHER): Payer: Self-pay | Admitting: Family Medicine

## 2021-06-16 ENCOUNTER — Other Ambulatory Visit: Payer: Self-pay

## 2021-06-16 ENCOUNTER — Ambulatory Visit (INDEPENDENT_AMBULATORY_CARE_PROVIDER_SITE_OTHER): Payer: BC Managed Care – PPO | Admitting: Family Medicine

## 2021-06-16 VITALS — BP 108/65 | HR 73 | Temp 98.1°F | Ht 62.0 in | Wt 168.0 lb

## 2021-06-16 DIAGNOSIS — Z9189 Other specified personal risk factors, not elsewhere classified: Secondary | ICD-10-CM | POA: Diagnosis not present

## 2021-06-16 DIAGNOSIS — R790 Abnormal level of blood mineral: Secondary | ICD-10-CM | POA: Diagnosis not present

## 2021-06-16 DIAGNOSIS — F908 Attention-deficit hyperactivity disorder, other type: Secondary | ICD-10-CM | POA: Diagnosis not present

## 2021-06-16 DIAGNOSIS — R7301 Impaired fasting glucose: Secondary | ICD-10-CM

## 2021-06-16 DIAGNOSIS — E669 Obesity, unspecified: Secondary | ICD-10-CM

## 2021-06-16 DIAGNOSIS — R632 Polyphagia: Secondary | ICD-10-CM | POA: Diagnosis not present

## 2021-06-16 DIAGNOSIS — Z6834 Body mass index (BMI) 34.0-34.9, adult: Secondary | ICD-10-CM

## 2021-06-16 MED ORDER — TIRZEPATIDE 2.5 MG/0.5ML ~~LOC~~ SOAJ: 2.5000 mg | SUBCUTANEOUS | 0 refills | Status: DC

## 2021-06-16 NOTE — Progress Notes (Signed)
Chief Complaint:   OBESITY Autumn [name] is here to discuss her progress with her obesity treatment plan along with follow-up of her obesity related diagnoses. See Medical Weight Management Flowsheet for complete bioelectrical impedance results.  Today's visit was #: 92 Starting weight: 187 lbs Starting date: 07/09/2020 Today's weight: 168 lbs Today's date: 06/16/2021 Weight change since last visit: +1 lb Total lbs lost to date: 19 lbs Body mass index is 30.73 kg/m.  Total weight loss percentage to date: -10.16%  Interim History:  Autumn [name] has a meeting with her therapist soon to make a plan for SAD.  She has had a busy summer so far. Nutrition Plan: the Category 2 Plan for 50% of the time. Activity:  Walking for 30 minutes 2-3 times per week.  Assessment/Plan:   1. Low ferritin Autumn [name] takes a daily multivitamin.    Nutrition: Iron-rich foods include dark leafy greens, red and white meats, eggs, seafood, and beans.  Certain foods and drinks prevent your body from absorbing iron properly. Avoid eating these foods in the same meal as iron-rich foods or with iron supplements. These foods include: coffee, black tea, and red wine; milk, dairy products, and foods that are high in calcium; beans and soybeans; whole grains. Constipation can be a side effect of iron supplementation. Increased water and fiber intake are helpful. Water goal: > 2 liters/day. Fiber goal: > 25 grams/day.  Plan:  Recheck anemia panel and CBC today.  - Anemia panel - CBC with Differential/Platelet  2. Polyphagia Improving, but not optimized. She will continue to focus on protein-rich, low simple carbohydrate foods. We reviewed the importance of hydration, regular exercise for stress reduction, and restorative sleep.  3. Impaired fasting glucose Start Mounjaro 2.5 mg subcutaneously weekly, as per below.  - Start tirzepatide [facility]) 2.5 MG/0.5ML Pen; Inject 2.5 mg into the skin once a week. (Patient not taking:  Reported on 06/19/2021)  Dispense: 2 mL; Refill: 0  4. Attention deficit hyperactivity disorder (ADHD), other type Autumn [name] is taking Adderall XR 25 mg daily for ADHD.  Plan:  Decrease Adderall to 20 mg daily at next refill.  5. At risk for heart disease Due to Orlean's current state of health and medical condition(s), she is at a higher risk for heart disease.  This puts the patient at much greater risk to subsequently develop cardiopulmonary conditions that can significantly affect patient's quality of life in a negative manner.    At least 8 minutes were spent on counseling Autumn [name] about these concerns today. Evidence-based interventions for health behavior change were utilized today including the discussion of self monitoring techniques, problem-solving barriers, and SMART goal setting techniques.  Specifically, regarding patient's less desirable eating habits and patterns, we employed the technique of small changes when Autumn [name] has not been able to fully commit to her prudent nutritional plan.  6. Obesity, current BMI 30.9  Course: Autumn [name] is currently in the action stage of change. As such, her goal is to continue with weight loss efforts.   Nutrition goals: She has agreed to the Category 2 Plan.   Exercise goals: For substantial health benefits, adults should do at least 150 minutes (2 hours and 30 minutes) a week of moderate-intensity, or 75 minutes (1 hour and 15 minutes) a week of vigorous-intensity aerobic physical activity, or an equivalent combination of moderate- and vigorous-intensity aerobic activity. Aerobic activity should be performed in episodes of at least 10 minutes, and preferably, it should be spread throughout the week.  Behavioral modification  strategies: increasing lean protein intake, decreasing simple carbohydrates, increasing vegetables, and increasing water intake.  Autumn [name] has agreed to follow-up with our clinic in 4 weeks. She was informed of the importance of frequent  follow-up visits to maximize her success with intensive lifestyle modifications for her multiple health conditions.   Objective:   Blood pressure 108/65, pulse 73, temperature 98.1 F (36.7 C), temperature source Oral, height 5\' 2"  (1.575 m), weight 168 lb (76.2 kg), last menstrual period 03/20/2020, SpO2 100 %. Body mass index is 30.73 kg/m.  General: Cooperative, alert, well developed, in no acute distress. HEENT: Conjunctivae and lids unremarkable. Cardiovascular: Regular rhythm.  Lungs: Normal work of breathing. Neurologic: No focal deficits.   Lab Results  Component Value Date   CREATININE 0.71 11/11/2020   BUN 12 11/11/2020   NA 139 11/11/2020   K 4.2 11/11/2020   CL 103 11/11/2020   CO2 25 11/11/2020   Lab Results  Component Value Date   ALT 13 11/11/2020   AST 16 11/11/2020   ALKPHOS 83 11/11/2020   BILITOT 0.4 11/11/2020   Lab Results  Component Value Date   TSH 3.960 11/11/2020   Lab Results  Component Value Date   CHOL 197 07/09/2020   HDL 55 07/09/2020   LDLCALC 125 07/09/2020   TRIG 92 07/09/2020   Lab Results  Component Value Date   VD25OH 32.9 11/11/2020   VD25OH 30.64 08/24/2017   Lab Results  Component Value Date   WBC 7.8 10/10/2020   HGB 12.3 10/10/2020   HCT 38.3 11/11/2020   MCV 90.0 10/10/2020   PLT 245 10/10/2020   Lab Results  Component Value Date   IRON 63 11/11/2020   TIBC 315 11/11/2020   FERRITIN 10 (L) 11/11/2020   Attestation Statements:   Reviewed by clinician on day of visit: allergies, medications, problem list, medical history, surgical history, family history, social history, and previous encounter notes.  I, Water quality scientist, CMA, am acting as transcriptionist for Briscoe Deutscher, DO  I have reviewed the above documentation for accuracy and completeness, and I agree with the above. Briscoe Deutscher, DO

## 2021-06-18 LAB — CBC WITH DIFFERENTIAL/PLATELET
Basophils Absolute: 0 10*3/uL (ref 0.0–0.2)
Basos: 1 %
EOS (ABSOLUTE): 0.1 10*3/uL (ref 0.0–0.4)
Eos: 1 %
Hemoglobin: 13.5 g/dL (ref 11.1–15.9)
Immature Grans (Abs): 0 10*3/uL (ref 0.0–0.1)
Immature Granulocytes: 0 %
Lymphocytes Absolute: 2.1 10*3/uL (ref 0.7–3.1)
Lymphs: 34 %
MCH: 30.1 pg (ref 26.6–33.0)
MCHC: 32.8 g/dL (ref 31.5–35.7)
MCV: 92 fL (ref 79–97)
Monocytes Absolute: 0.4 10*3/uL (ref 0.1–0.9)
Monocytes: 6 %
Neutrophils Absolute: 3.6 10*3/uL (ref 1.4–7.0)
Neutrophils: 58 %
Platelets: 219 10*3/uL (ref 150–450)
RBC: 4.49 x10E6/uL (ref 3.77–5.28)
RDW: 12 % (ref 11.7–15.4)
WBC: 6.2 10*3/uL (ref 3.4–10.8)

## 2021-06-18 LAB — ANEMIA PANEL
Ferritin: 15 ng/mL (ref 15–150)
Folate, Hemolysate: 423 ng/mL
Folate, RBC: 1029 ng/mL (ref >498)
Hematocrit: 41.1 % (ref 34.0–46.6)
Iron Saturation: 25 % (ref 15–55)
Iron: 80 ug/dL (ref 27–159)
Retic Ct Pct: 1.1 % (ref 0.6–2.6)
Total Iron Binding Capacity: 316 ug/dL (ref 250–450)
UIBC: 236 ug/dL (ref 131–425)
Vitamin B-12: 316 pg/mL (ref 232–1245)

## 2021-06-19 ENCOUNTER — Telehealth (HOSPITAL_BASED_OUTPATIENT_CLINIC_OR_DEPARTMENT_OTHER): Payer: Self-pay | Admitting: Obstetrics & Gynecology

## 2021-06-19 ENCOUNTER — Ambulatory Visit (INDEPENDENT_AMBULATORY_CARE_PROVIDER_SITE_OTHER): Payer: BC Managed Care – PPO | Admitting: Obstetrics & Gynecology

## 2021-06-19 ENCOUNTER — Other Ambulatory Visit (HOSPITAL_COMMUNITY)
Admission: RE | Admit: 2021-06-19 | Discharge: 2021-06-19 | Disposition: A | Discharge status: Home / Self Care | Payer: BC Managed Care – PPO | Source: Ambulatory Visit | Attending: Obstetrics & Gynecology | Admitting: Obstetrics & Gynecology

## 2021-06-19 ENCOUNTER — Other Ambulatory Visit (HOSPITAL_BASED_OUTPATIENT_CLINIC_OR_DEPARTMENT_OTHER): Payer: Self-pay

## 2021-06-19 ENCOUNTER — Encounter (HOSPITAL_BASED_OUTPATIENT_CLINIC_OR_DEPARTMENT_OTHER): Payer: Self-pay | Admitting: Obstetrics & Gynecology

## 2021-06-19 ENCOUNTER — Other Ambulatory Visit: Payer: Self-pay

## 2021-06-19 VITALS — BP 124/76 | HR 82 | Ht 62.0 in | Wt 173.8 lb

## 2021-06-19 DIAGNOSIS — N766 Ulceration of vulva: Secondary | ICD-10-CM

## 2021-06-19 DIAGNOSIS — N898 Other specified noninflammatory disorders of vagina: Secondary | ICD-10-CM | POA: Diagnosis not present

## 2021-06-19 MED ORDER — VALACYCLOVIR HCL 1 G PO TABS: 1000.0000 mg | ORAL_TABLET | Freq: Two times a day (BID) | ORAL | 0 refills | Status: DC

## 2021-06-19 NOTE — Progress Notes (Signed)
GYNECOLOGY  VISIT  CC:   possible vaginal cyst  HPI: 45 y.o. G29P2002 Married White or Caucasian female here for complaint of vaginal cyst that she noted a few days ago that is tender.  There is some pain.  Denies vaginal bleeding or discharge.  No new sexual partners.  No fever.  No urinary symptoms.  No pelvic pain.  GYNECOLOGIC HISTORY: Patient's last menstrual period was 03/20/2020. Contraception: hysterectomy Menopausal hormone therapy: none  Patient Active Problem List   Diagnosis Date Noted   History of hysterectomy 04/20/2021   Family history of malignant neoplasm of digestive organ 04/15/2021   Trochanteric bursitis of left hip 01/12/2020   Patellofemoral arthritis of right knee 05/11/2018   Lumbar radiculopathy 03/22/2018   Nonallopathic lesion of thoracic region 10/07/2017   Nonallopathic lesion of cervical region 10/07/2017   Nonallopathic lesion of lumbosacral region 10/07/2017   Piriformis syndrome of right side 08/24/2017   Polyarthropathy or polyarthritis of multiple sites 08/24/2017   Injury of digital nerve of left ring finger 08/02/2017   Mild episode of recurrent major depressive disorder ([place]) 02/16/2017   Attention deficit hyperactivity disorder (ADHD) 02/16/2017   Psoriasis 02/16/2017   Heterozygous factor V Leiden mutation ([place]) 09/01/2016   Nephrolithiasis 06/07/2012   Headache, menstrual migraine 06/07/2012   Malignant melanoma ([place]) 06/07/2012    Past Medical History:  Diagnosis Date   Anemia    Anxiety    Attention deficit hyperactivity disorder (ADHD) 02/16/2017   Sees [facility] for managment   Back pain    Calculus of kidney 06/07/2012   Overview:  Dr Karsten Ro - urology    Cancer [facility])    melanoma removed from abdomen in 3299   Complication of anesthesia    low BP during C-section   Depression    Factor V Leiden carrier ([place])    heterozygous   Hernia, rectovaginal 06/07/2012   Overview:  Dr Corinna Capra - gyn    History of  abnormal cervical Pap smear 1997   dysplasia- LEEP   History of reconstructive repair of rectocele    IBS (irritable bowel syndrome)    Joint pain    Lower extremity edema    Migraine    Ovarian cyst 1994   Psoriasis 02/16/2017   -sees dermatologist   Psoriatic arthritis [facility])     Past Surgical History:  Procedure Laterality Date   BLADDER SUSPENSION N/A 04/08/2020   Procedure: POSSIBLE TRANSVAGINAL TAPE (TVT) PROCEDURE MIDURETHAL SLING;  Surgeon: Nunzio Cobbs, MD;  Location: [place];  Service: Gynecology;  Laterality: N/A;   CERVICAL BIOPSY  W/ LOOP ELECTRODE EXCISION     CESAREAN SECTION  2007 & 2010   CYSTOSCOPY N/A 04/08/2020   Procedure: CYSTOSCOPY;  Surgeon: Nunzio Cobbs, MD;  Location: [facility];  Service: Gynecology;  Laterality: N/A;   KNEE ARTHROPLASTY Right [address] N/A 04/08/2020   Procedure: POSTERIOR REPAIR (RECTOCELE) WITH NATIVE TISSUE REPAIR;  Surgeon: Nunzio Cobbs, MD;  Location: [facility];  Service: Gynecology;  Laterality: N/A;   SINUS ENDO WITH FUSION     TOTAL LAPAROSCOPIC HYSTERECTOMY WITH SALPINGECTOMY N/A 04/08/2020   Procedure: TOTAL LAPAROSCOPIC HYSTERECTOMY WITH BILATERAL SALPINGECTOMY;  Surgeon: Megan Salon, MD;  Location: [facility];  Service: Gynecology;  Laterality: N/A;   TUBAL LIGATION  2010   VULVA /PERINEUM BIOPSY  01/2015  MEDS:   Current Outpatient Medications on File Prior to Visit  Medication Sig Dispense Refill   amphetamine-dextroamphetamine (ADDERALL XR) 25 MG 24 hr capsule Take 1 capsule by mouth daily. 30 capsule 0   Multiple Vitamin (MULTIVITAMIN WITH MINERALS) TABS tablet Take 1 tablet by mouth daily.     Ubrogepant (UBRELVY) 100 MG TABS Take 100 mg by mouth daily as needed (migraine). 30 tablet 0   tirzepatide (MOUNJARO) 2.5 MG/0.5ML Pen Inject 2.5 mg into the skin once a week. (Patient  not taking: Reported on 06/19/2021) 2 mL 0   No current facility-administered medications on file prior to visit.    ALLERGIES: Patient has no known allergies.  Family History  Problem Relation Age of Onset   Hyperlipidemia Mother    Hyperlipidemia Father    Cancer Father        throat   Esophageal cancer Maternal Uncle    Rectal cancer Maternal Uncle    Diabetes Maternal Grandmother    Heart disease Maternal Grandmother    Breast cancer Maternal Aunt 76       stage 1   Emphysema Maternal Grandfather    Emphysema Paternal Grandfather    Healthy Paternal 42    Healthy Son    Healthy Son     SH:  married, non smoker  Review of Systems  Constitutional: Negative.   Genitourinary:  Positive for genital sores.   PHYSICAL EXAMINATION:    BP 124/76   Pulse 82   Ht 5\' 2"  (1.575 m)   Wt 173 lb 12.8 oz (78.8 kg)   LMP 03/20/2020   BMI 31.79 kg/m     General appearance: alert, cooperative and appears stated age Lymph:  no inguinal LAD noted  Pelvic: External genitalia: single ulcerated lesion on inner lower left labia majora, tender              Urethra:  normal appearing urethra with no masses, tenderness or lesions              Bartholins and Skenes: normal                 Vagina: normal appearing vagina with normal color and discharge, no lesions              Cervix: absent              Bimanual Exam:  Uterus:  uterus absent              Adnexa: no mass, fullness, tenderness              Anus:  no lesions  Chaperone, Octaviano Batty, CMA, was present for exam.  Assessment/Plan: 1. Vulvar ulceration - discussed with pt concerning findings for HSV - Cytology - PAP( [facility]) for HSV testing only - valACYclovir (VALTREX) 1000 MG tablet; Take 1 tablet (1,000 mg total) by mouth 2 (two) times daily.  Dispense: 20 tablet; Refill: 0 - topical neosporin also recommended - pt does not feel she needs anything for pain.  Has gabapentin rx at home if needs.  Does not  want RF at this time.  2. Vaginal irritation - will test for yeast and BV - Cervicovaginal ancillary only( [facility])

## 2021-06-19 NOTE — Telephone Encounter (Signed)
Pt states that she has been having some localized pain with slight itching in the vaginal area. She states that she looked with a mirror and saw a spot that looked like an ulcer that you would get in your mouth. Pt given appt for evaluation.

## 2021-06-19 NOTE — Telephone Encounter (Signed)
Patient called and would like to talk to the nurse about the problem she is having. Inform the patient that I would send the nurse a message .

## 2021-06-20 LAB — CERVICOVAGINAL ANCILLARY ONLY
Bacterial Vaginitis (gardnerella): POSITIVE — AB
Candida Glabrata: NEGATIVE
Candida Vaginitis: NEGATIVE
Comment: NEGATIVE
Comment: NEGATIVE
Comment: NEGATIVE

## 2021-06-23 ENCOUNTER — Other Ambulatory Visit (HOSPITAL_COMMUNITY)
Admission: RE | Admit: 2021-06-23 | Discharge: 2021-06-23 | Disposition: A | Discharge status: Home / Self Care | Payer: BC Managed Care – PPO | Source: Ambulatory Visit | Attending: Obstetrics & Gynecology | Admitting: Obstetrics & Gynecology

## 2021-06-23 DIAGNOSIS — N766 Ulceration of vulva: Secondary | ICD-10-CM | POA: Diagnosis not present

## 2021-06-23 NOTE — Addendum Note (Signed)
Addended by: Alen Blew on: 06/23/2021 04:31 PM   Modules accepted: Orders

## 2021-06-24 ENCOUNTER — Encounter (INDEPENDENT_AMBULATORY_CARE_PROVIDER_SITE_OTHER): Payer: Self-pay

## 2021-06-26 LAB — CERVICOVAGINAL ANCILLARY ONLY
Comment: NEGATIVE
HSV1: NEGATIVE
HSV2: NEGATIVE

## 2021-06-30 ENCOUNTER — Ambulatory Visit: Payer: BC Managed Care – PPO | Admitting: Physician Assistant

## 2021-07-01 ENCOUNTER — Other Ambulatory Visit (INDEPENDENT_AMBULATORY_CARE_PROVIDER_SITE_OTHER): Payer: Self-pay | Admitting: Family Medicine

## 2021-07-01 MED ORDER — AMPHETAMINE-DEXTROAMPHET ER 20 MG PO CP24: 20.0000 mg | ORAL_CAPSULE | Freq: Every day | ORAL | 0 refills | Status: DC

## 2021-07-01 NOTE — Telephone Encounter (Signed)
Dr.Wallace °

## 2021-07-14 ENCOUNTER — Other Ambulatory Visit: Payer: Self-pay

## 2021-07-14 ENCOUNTER — Ambulatory Visit (INDEPENDENT_AMBULATORY_CARE_PROVIDER_SITE_OTHER): Payer: BC Managed Care – PPO | Admitting: Family Medicine

## 2021-07-14 ENCOUNTER — Encounter (INDEPENDENT_AMBULATORY_CARE_PROVIDER_SITE_OTHER): Payer: Self-pay | Admitting: Family Medicine

## 2021-07-14 VITALS — BP 102/69 | HR 86 | Temp 98.3°F | Ht 62.0 in | Wt 162.0 lb

## 2021-07-14 DIAGNOSIS — R790 Abnormal level of blood mineral: Secondary | ICD-10-CM

## 2021-07-14 DIAGNOSIS — E538 Deficiency of other specified B group vitamins: Secondary | ICD-10-CM | POA: Diagnosis not present

## 2021-07-14 DIAGNOSIS — E669 Obesity, unspecified: Secondary | ICD-10-CM

## 2021-07-14 DIAGNOSIS — Z6834 Body mass index (BMI) 34.0-34.9, adult: Secondary | ICD-10-CM

## 2021-07-14 DIAGNOSIS — Z9189 Other specified personal risk factors, not elsewhere classified: Secondary | ICD-10-CM | POA: Diagnosis not present

## 2021-07-14 DIAGNOSIS — F908 Attention-deficit hyperactivity disorder, other type: Secondary | ICD-10-CM | POA: Diagnosis not present

## 2021-07-14 DIAGNOSIS — R7301 Impaired fasting glucose: Secondary | ICD-10-CM | POA: Diagnosis not present

## 2021-07-16 MED ORDER — AMPHETAMINE-DEXTROAMPHETAMINE 15 MG PO TABS: 15.0000 mg | ORAL_TABLET | Freq: Every day | ORAL | 0 refills | Status: DC

## 2021-07-16 MED ORDER — TIRZEPATIDE 2.5 MG/0.5ML ~~LOC~~ SOAJ: 2.5000 mg | SUBCUTANEOUS | 0 refills | Status: DC

## 2021-07-16 NOTE — Progress Notes (Signed)
Chief Complaint:   OBESITY Autumn [name] is here to discuss her progress with her obesity treatment plan along with follow-up of her obesity related diagnoses.   Today's visit was #: 14 Starting weight: 187 lbs Starting date: 07/09/2020 Today's weight: 162 lbs Today's date: 07/14/2021 Weight change since last visit: 6 lbs Total lbs lost to date: 25 lbs Body mass index is 29.63 kg/m.  Total weight loss percentage to date: -13.37%  Interim History:  Zell has had 3 injections of Mounjaro thus far.  She denies polyphagia. Current Meal Plan: the Category 2 Plan for 60% of the time.  Current Exercise Plan: Walking for 30-40 minutes 5-6 times per week. Current Anti-Obesity Medications: Mounjaro 2.5 mg subcutaneously weekly. Side effects: None.  Assessment/Plan:   1. Impaired fasting glucose, with polyphagia Controlled. Current treatment: Mounjaro 2.5 mg subcutaneously weekly. She will continue to focus on protein-rich, low simple carbohydrate foods. We reviewed the importance of hydration, regular exercise for stress reduction, and restorative sleep.  2. B12 deficiency Lab Results  Component Value Date   VITAMINB12 316 06/16/2021   Supplementation: Daily multivitamin.   Plan:  Oral vitamin B12 1000 mcg daily.  3. Low ferritin Start ferrous sulfate 325 mg every other day.  4. Attention deficit hyperactivity disorder (ADHD), other type Moderately controlled on current regimen, but contributing to insomnia. Elmina will start Adderall IR 15 mg daily, #30.  I have consulted the [place]rolled Substances Registry for this patient, and feel the risk/benefit ratio today is favorable for proceeding with this prescription for a controlled substance. The patient understands monitoring parameters and red flags.   5. At risk for deficient intake of food [place] was given extensive education and counseling today of more than 8 minutes on risks associated with deficient food intake.  Counseled her  on the importance of following our prescribed meal plan and eating adequate amounts of protein.  Discussed with Luciella that inadequate food intake over longer periods of time can slow their metabolism down significantly.    6. Obesity, current BMI 29.7  Course: Autumn [name] is currently in the action stage of change. As such, her goal is to continue with weight loss efforts.   Nutrition goals: She has agreed to the Category 2 Plan.   Exercise goals:  As is.  Behavioral modification strategies: increasing lean protein intake, decreasing simple carbohydrates, increasing vegetables, and increasing water intake.  Jazel has agreed to follow-up with our clinic in 3-4 weeks. She was informed of the importance of frequent follow-up visits to maximize her success with intensive lifestyle modifications for her multiple health conditions.   Objective:   Blood pressure 102/69, pulse 86, temperature 98.3 F (36.8 C), temperature source Oral, height '5\' 2"'$  (1.575 m), weight 162 lb (73.5 kg), last menstrual period 03/20/2020, SpO2 100 %. Body mass index is 29.63 kg/m.  General: Cooperative, alert, well developed, in no acute distress. HEENT: Conjunctivae and lids unremarkable. Cardiovascular: Regular rhythm.  Lungs: Normal work of breathing. Neurologic: No focal deficits.   Lab Results  Component Value Date   CREATININE 0.71 11/11/2020   BUN 12 11/11/2020   NA 139 11/11/2020   K 4.2 11/11/2020   CL 103 11/11/2020   CO2 25 11/11/2020   Lab Results  Component Value Date   ALT 13 11/11/2020   AST 16 11/11/2020   ALKPHOS 83 11/11/2020   BILITOT 0.4 11/11/2020   Lab Results  Component Value Date   TSH 3.960 11/11/2020   Lab Results  Component Value Date   CHOL 197 07/09/2020   HDL 55 07/09/2020   LDLCALC 125 07/09/2020   TRIG 92 07/09/2020   Lab Results  Component Value Date   VD25OH 32.9 11/11/2020   VD25OH 30.64 08/24/2017   Lab Results  Component Value Date   WBC 6.2 06/16/2021    HGB 13.5 06/16/2021   HCT 41.1 06/16/2021   MCV 92 06/16/2021   PLT 219 06/16/2021   Lab Results  Component Value Date   IRON 80 06/16/2021   TIBC 316 06/16/2021   FERRITIN 15 06/16/2021   Attestation Statements:   Reviewed by clinician on day of visit: allergies, medications, problem list, medical history, surgical history, family history, social history, and previous encounter notes.  I, Water quality scientist, CMA, am acting as transcriptionist for Briscoe Deutscher, DO  I have reviewed the above documentation for accuracy and completeness, and I agree with the above. Briscoe Deutscher, DO

## 2021-07-21 ENCOUNTER — Encounter (INDEPENDENT_AMBULATORY_CARE_PROVIDER_SITE_OTHER): Payer: Self-pay | Admitting: Family Medicine

## 2021-07-24 ENCOUNTER — Ambulatory Visit: Payer: BC Managed Care – PPO | Admitting: Neurology

## 2021-07-25 DIAGNOSIS — Z0000 Encounter for general adult medical examination without abnormal findings: Secondary | ICD-10-CM | POA: Diagnosis not present

## 2021-07-26 LAB — LIPID PANEL
Cholesterol: 187 (ref 0–200)
HDL: 55 (ref 35–70)
LDL Cholesterol: 119
Triglycerides: 67 (ref 40–160)

## 2021-07-26 LAB — BASIC METABOLIC PANEL
BUN: 13 (ref 4–21)
CO2: 25 — AB (ref 13–22)
Chloride: 102 (ref 99–108)
Creatinine: 0.8 (ref 0.5–1.1)
Glucose: 83
Potassium: 4.3 (ref 3.4–5.3)
Sodium: 138 (ref 137–147)

## 2021-07-26 LAB — HEPATIC FUNCTION PANEL
ALT: 10 (ref 7–35)
AST: 13 (ref 13–35)
Alkaline Phosphatase: 57 (ref 25–125)
Bilirubin, Total: 0.5

## 2021-07-26 LAB — CBC AND DIFFERENTIAL
HCT: 42 (ref 36–46)
Hemoglobin: 13.6 (ref 12.0–16.0)
Neutrophils Absolute: 2.9
Platelets: 213 (ref 150–399)
WBC: 6.1

## 2021-07-26 LAB — COMPREHENSIVE METABOLIC PANEL
Albumin: 4 (ref 3.5–5.0)
Calcium: 8.9 (ref 8.7–10.7)

## 2021-07-26 LAB — CBC: RBC: 4.67 (ref 3.87–5.11)

## 2021-07-26 LAB — TSH: TSH: 3.61 (ref 0.41–5.90)

## 2021-07-29 DIAGNOSIS — Z23 Encounter for immunization: Secondary | ICD-10-CM | POA: Diagnosis not present

## 2021-07-29 DIAGNOSIS — Z0000 Encounter for general adult medical examination without abnormal findings: Secondary | ICD-10-CM | POA: Diagnosis not present

## 2021-08-08 ENCOUNTER — Ambulatory Visit (INDEPENDENT_AMBULATORY_CARE_PROVIDER_SITE_OTHER): Payer: BC Managed Care – PPO | Admitting: Neurology

## 2021-08-08 ENCOUNTER — Encounter: Payer: Self-pay | Admitting: Neurology

## 2021-08-08 VITALS — BP 96/61 | HR 75 | Ht 62.0 in | Wt 161.0 lb

## 2021-08-08 DIAGNOSIS — G43709 Chronic migraine without aura, not intractable, without status migrainosus: Secondary | ICD-10-CM | POA: Diagnosis not present

## 2021-08-08 MED ORDER — TOPIRAMATE 25 MG PO TABS: 25.0000 mg | ORAL_TABLET | Freq: Every day | ORAL | 6 refills | Status: DC

## 2021-08-08 NOTE — Progress Notes (Signed)
[id number]    Provider:  Dr Jaynee Eagles Requesting Provider: Briscoe Deutscher, DO Primary Care Provider:  Fanny Bien, MD  CC:  migraines  HPI:  Autumn [name] is a 45 y.o. female here as requested by Briscoe Deutscher, DO for migraines. PMHx anxiety, adhd, back pain, depression, ibs, migraines. She has had migraines all her adult life, during her period like clockwork, the hysterectomy helped, very hormonal.She saw a neurologist in the past also [facility] Sima [facility], migraines for years, she only uses acute, Roselyn Meier has been the best out of all of them. She has 20 headache days a month, she has migraines that are debilitating, severe, she can feel it coming on, she can try advil or tylenol first, unilateral, pulsating/pounding, photophobia/phonophobia, nausea, no aura,+photophobia, moderate to severe and life altering, 8 migraine days a month that can last 4-12 hours, she can wake up with it, coffee helps, ongoing over a year, no aura, no medication overuse, she takes ibuprofen (we discussed medication overuse). She is going to see an eye doctor. Mother has migraines.No other focal neurologic deficits, associated symptoms, inciting events or modifiable factors.  Reviewed notes, labs and imaging from outside physicians, which showed:  From a thorough review of records, medications tried include: Prozac, gabapentin, toradol, mobic, naproxen, zofran, prednisone, phenergan, maxalt, ubrelvy, effexor, sumatriptan.  Imitrex(worked but made her feel bad), amerge, maxalt.  Amitriptyline(tried with severe side effects) and propranolol (hypotension) contraindicated  CT maxillofacial reviewed images and agree with the following: CLINICAL DATA:   Chronic sinusitis.  CT SINUS W/INSTA TRAK:    Multidetector helical scans through the paranasal sinuses were performed in both the prone coronal and axial plane.  The Insta Trak device was utilized.  There is almost  complete opacification of the left maxillary sinus.  Some of the periphery of the left maxillary sinus is aerated surrounding what appears to be a rounded soft tissue mass.  This may represent large retention cyst, but polyp or other mass cannot be excluded.  There is some sclerosis of the left maxillary sinus wall which may indicate a chronic process.  There is slight bulging of the maxillary sinus walls on the left but this appearance is not typical of mucocele.  The remainder of the sinuses are well pneumatized.  Nasal airway is patent, but there is nasal septal deviation and bony spurring to the left of midline.  The Insta Trak device was utilized.    IMPRESSION:  1.  Almost complete opacification of the left maxillary sinus.  Consider large retention cyst or large polyp vs other mass.  Doubt mucocele.  2.  Nasal septal deviation to the left of midline.  3.  Insta Trak device utilized  TSH not check since 11/2020 will check again was 3.960  Review of Systems: Patient complains of symptoms per HPI as well as the following symptoms anxiety/depression. Pertinent negatives and positives per HPI. All others negative.   Social History   Socioeconomic History   Marital status: Married    Spouse name: Not on file   Number of children: Not on file   Years of education: Not on file   Highest education level: Bachelor's degree (e.g., BA, AB, BS)  Occupational History   Occupation: stay at home spouse  Tobacco Use   Smoking status: Never   Smokeless tobacco: Never  Vaping Use   Vaping Use: Never used  Substance and Sexual Activity   Alcohol use: Yes    Alcohol/week:  0.0 - 3.0 standard drinks    Comment: occas.   Drug use: No   Sexual activity: Yes    Partners: Male    Birth control/protection: Surgical    Comment: Hysterectomy  Other Topics Concern   Not on file  Social History Narrative   Grew up in [place], Dad worked for [employer] and is retired, did maintenance on buses.  Mom worked in Doctor, hospital, then [employer].    Husband of 21 years is Customer service manager. She and husband and 2 sons, 35 and 67 yo.      Works at [employer] at her church 2 days a week, just started after the first of the year.    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 Worked in [place] at [employer].      Spiritual Beliefs: Christian   Caffeine-2 coffee, occas   Legal none   Social Determinants of Health   Financial Resource Strain: Low Risk    Difficulty of Paying Living Expenses: Not hard at all  Food Insecurity: No Food Insecurity   Worried About Charity fundraiser in the Last Year: Never true   Arboriculturist in the Last Year: Never true  Transportation Needs: No Transportation Needs   Lack of Transportation (Medical): No   Lack of Transportation (Non-Medical): No  Physical Activity: Inactive   Days of Exercise per Week: 0 days   Minutes of Exercise per Session: 0 min  Stress: Stress Concern Present   Feeling of Stress : To some extent  Social Connections: Moderately Integrated   Frequency of Communication with Friends and Family: More than three times a week   Frequency of Social Gatherings with Friends and Family: More than three times a week   Attends Religious Services: More than 4 times per year   Active Member of [employer] or Organizations: Yes   Attends Archivist Meetings: More than 4 times per year   Marital Status: Widowed  Human resources officer Violence: Not on file    Family History  Problem Relation Age of Onset   Migraines Mother    Hyperlipidemia Mother    Hyperlipidemia Father    Cancer Father        throat   Diabetes Maternal Grandmother    Heart disease Maternal Grandmother    Emphysema Maternal Grandfather    Healthy Paternal Grandmother    Emphysema Paternal Grandfather    Healthy Son    Healthy Son    Breast cancer Maternal Aunt 76       stage 1   Esophageal cancer Maternal Uncle    Rectal cancer Maternal Uncle      Past Medical History:  Diagnosis Date   Anemia    Anxiety    Attention deficit hyperactivity disorder (ADHD) 02/16/2017   Sees [facility] for managment   Back pain    Calculus of kidney 06/07/2012   Overview:  Dr Karsten Ro - urology    Cancer [facility])    melanoma removed from abdomen in [id number]   Complication of anesthesia    low BP during C-section   Depression    Factor V Leiden carrier [facility])    heterozygous   Hernia, rectovaginal 06/07/2012   Overview:  Dr Corinna Capra - gyn    History of abnormal cervical Pap smear 1997   dysplasia- LEEP   History of reconstructive repair of rectocele    IBS (irritable bowel syndrome)  Joint pain    Lower extremity edema    Migraine    Ovarian cyst 1994   Psoriasis 02/16/2017   -sees dermatologist   Psoriatic arthritis [facility])     Patient Active Problem List   Diagnosis Date Noted   Chronic migraine without aura without status migrainosus, not intractable 08/11/2021   History of hysterectomy 04/20/2021   Family history of malignant neoplasm of digestive organ 04/15/2021   Trochanteric bursitis of left hip 01/12/2020   Patellofemoral arthritis of right knee 05/11/2018   Lumbar radiculopathy 03/22/2018   Nonallopathic lesion of thoracic region 10/07/2017   Nonallopathic lesion of cervical region 10/07/2017   Nonallopathic lesion of lumbosacral region 10/07/2017   Piriformis syndrome of right side 08/24/2017   Polyarthropathy or polyarthritis of multiple sites 08/24/2017   Injury of digital nerve of left ring finger 08/02/2017   Mild episode of recurrent major depressive disorder ([place]) 02/16/2017   Attention deficit hyperactivity disorder (ADHD) 02/16/2017   Psoriasis 02/16/2017   Heterozygous factor V Leiden mutation ([place]) 09/01/2016   Nephrolithiasis 06/07/2012   Headache, menstrual migraine 06/07/2012   Malignant melanoma ([place]) 06/07/2012    Past Surgical History:  Procedure Laterality Date   BLADDER SUSPENSION N/A  04/08/2020   Procedure: POSSIBLE TRANSVAGINAL TAPE (TVT) PROCEDURE MIDURETHAL SLING;  Surgeon: Nunzio Cobbs, MD;  Location: [place];  Service: Gynecology;  Laterality: N/A;   CERVICAL BIOPSY  W/ LOOP ELECTRODE EXCISION     CESAREAN SECTION  2007 & 2010   CYSTOSCOPY N/A 04/08/2020   Procedure: CYSTOSCOPY;  Surgeon: Nunzio Cobbs, MD;  Location: [facility];  Service: Gynecology;  Laterality: N/A;   KNEE ARTHROPLASTY Right [place] N/A 04/08/2020   Procedure: POSTERIOR REPAIR (RECTOCELE) WITH NATIVE TISSUE REPAIR;  Surgeon: Nunzio Cobbs, MD;  Location: [facility];  Service: Gynecology;  Laterality: N/A;   SINUS ENDO WITH FUSION     TOTAL LAPAROSCOPIC HYSTERECTOMY WITH SALPINGECTOMY N/A 04/08/2020   Procedure: TOTAL LAPAROSCOPIC HYSTERECTOMY WITH BILATERAL SALPINGECTOMY;  Surgeon: Megan Salon, MD;  Location[facility];  Service: Gynecology;  Laterality: N/A;   TUBAL LIGATION  2010   VULVA /PERINEUM BIOPSY  01/2015    Current Outpatient Medications  Medication Sig Dispense Refill   amphetamine-dextroamphetamine (ADDERALL) 15 MG tablet Take 1 tablet by mouth daily. 30 tablet 0   Multiple Vitamin (MULTIVITAMIN WITH MINERALS) TABS tablet Take 1 tablet by mouth daily.     tirzepatide [facility]) 2.5 MG/0.5ML Pen Inject 2.5 mg into the skin once a week. 2 mL 0   topiramate (TOPAMAX) 25 MG tablet Take 1 tablet (25 mg total) by mouth at bedtime. 30 tablet 6   Ubrogepant (UBRELVY) 100 MG TABS Take 100 mg by mouth daily as needed (migraine). 30 tablet 0   No current facility-administered medications for this visit.    Allergies as of 08/08/2021   (No Known Allergies)    Vitals: BP 96/61   Pulse 75   Ht '5\' 2"'$  (1.575 m)   Wt 161 lb (73 kg)   LMP 03/20/2020   BMI 29.45 kg/m  Last Weight:  Wt Readings from Last 1 Encounters:  08/08/21 161 lb (73 kg)    Last Height:   Ht Readings from Last 1 Encounters:  08/08/21 '5\' 2"'$  (1.575 m)     Physical exam: Exam: Gen: NAD, conversant, well nourised, well groomed  CV: RRR, no MRG. No Carotid Bruits. No peripheral edema, warm, nontender Eyes: Conjunctivae clear without exudates or hemorrhage  Neuro: Detailed Neurologic Exam  Speech:    Speech is normal; fluent and spontaneous with normal comprehension.  Cognition:    The patient is oriented to person, place, and time;     recent and remote memory intact;     language fluent;     normal attention, concentration,     fund of knowledge Cranial Nerves:    The pupils are equal, round, and reactive to light. The fundi are flat. Visual fields are full to finger confrontation. Extraocular movements are intact. Trigeminal sensation is intact and the muscles of mastication are normal. The face is symmetric. The palate elevates in the midline. Hearing intact. Voice is normal. Shoulder shrug is normal. The tongue has normal motion without fasciculations.   Coordination:    Normal.   Gait:    normal.   Motor Observation:    No asymmetry, no atrophy, and no involuntary movements noted. Tone:    Normal muscle tone.    Posture:    Posture is normal. normal erect    Strength:    Strength is V/V in the upper and lower limbs.      Sensation: intact to LT     Reflex Exam:  DTR's:    Deep tendon reflexes in the upper and lower extremities are normal bilaterally.   Toes:    The toes are downgoing bilaterally.   Clonus:    Clonus is absent.    Assessment/Plan:  Patient with chronic migraines, we had a long discussion about options, at this time patient will hold off on imaging of the brain but highly recommended to patient if headaches do not improve significantly with treatment.  Topamax '25mg'$  at bedtime  Roselyn Meier as needed If that does not work, try Ajovy or [employer] monthly injections (Also consider Qulipta daily pill  but has more GI side effects) Then I would consider  to botox She will mychart me and let me know about next steps, try CGRPs then go to botox, she would rather do that instead of set up a follow up appointment Follow with ENT/allergist as well as needed, has had sinus disease in the past (see ct 2005) Last TSH 11/2020 will recheck  Orders Placed This Encounter  Procedures   TSH    Meds ordered this encounter  Medications   topiramate (TOPAMAX) 25 MG tablet    Sig: Take 1 tablet (25 mg total) by mouth at bedtime.    Dispense:  30 tablet    Refill:  6    Cc: Briscoe Deutscher, DO,  Fanny Bien, MD  Sarina Ill, MD  [facility] [address] [place], [place] [address]  Phone [phone] Fax [phone]

## 2021-08-08 NOTE — Patient Instructions (Addendum)
Topamax '25mg'$  at bedtime  Roselyn Meier as needed If that does not work, try Ajovy or [employer] monthly injections (Also consider Qulipta daily pill but has more GI side effects) Then I would go to botox  Topiramate Tablets What is this medication? TOPIRAMATE (toe PYRE a mate) prevents and controls seizures in people with epilepsy. It may also be used to prevent migraine headaches. It works by calming overactive nerves in your body. This medicine may be used for other purposes; ask your health care provider or pharmacist if you have questions. COMMON BRAND NAME(S): Topamax, Topiragen What should I tell my care team before I take this medication? They need to know if you have any of these conditions: Bleeding disorder Kidney disease Lung disease Suicidal thoughts, plans or attempt An unusual or allergic reaction to topiramate, other medications, foods, dyes, or preservatives Pregnant or trying to get pregnant Breast-feeding How should I use this medication? Take this medication by mouth with water. Take it as directed on the prescription label at the same time every day. Do not cut, crush or chew this medicine. Swallow the tablets whole. You can take it with or without food. If it upsets your stomach, take it with food. Keep taking it unless your care team tells you to stop. A special MedGuide will be given to you by the pharmacist with each prescription and refill. Be sure to read this information carefully each time. Talk to your care team about the use of this medication in children. While it may be prescribed for children as young as 2 years for selected conditions, precautions do apply. Overdosage: If you think you have taken too much of this medicine contact a poison control center or emergency room at once. NOTE: This medicine is only for you. Do not share this medicine with others. What if I miss a dose? If you miss a dose, take it as soon as you can unless it is within 6 hours of the next  dose. If it is within 6 hours of the next dose, skip the missed dose. Take the next dose at the normal time. Do not take double or extra doses. What may interact with this medication? Acetazolamide Alcohol Antihistamines for allergy, cough, and cold Aspirin and aspirin-like medications Atropine Birth control pills Certain medications for anxiety or sleep Certain medications for bladder problems like oxybutynin, tolterodine Certain medications for depression like amitriptyline, fluoxetine, sertraline Certain medications for Parkinson's disease like benztropine, trihexyphenidyl Certain medications for seizures like carbamazepine, lamotrigine, phenobarbital, phenytoin, primidone, valproic acid, zonisamide Certain medications for stomach problems like dicyclomine, hyoscyamine Certain medications for travel sickness like scopolamine Certain medications that treat or prevent blood clots like warfarin, enoxaparin, dalteparin, apixaban, dabigatran, and rivaroxaban Digoxin Diltiazem General anesthetics like halothane, isoflurane, methoxyflurane, propofol Glyburide Hydrochlorothiazide Ipratropium Lithium Medications that relax muscles Metformin Narcotic medications for pain NSAIDs, medications for pain and inflammation, like ibuprofen or naproxen Phenothiazines like chlorpromazine, mesoridazine, prochlorperazine, thioridazine Pioglitazone This list may not describe all possible interactions. Give your health care provider a list of all the medicines, herbs, non-prescription drugs, or dietary supplements you use. Also tell them if you smoke, drink alcohol, or use illegal drugs. Some items may interact with your medicine. What should I watch for while using this medication? Visit your care team for regular checks on your progress. Tell your care team if your symptoms do not start to get better or if they get worse. Do not suddenly stop taking this medication. You may develop a severe reaction.  Your care team will tell you how much medication to take. If your care team wants you to stop the medication, the dose may be slowly lowered over time to avoid any side effects. Wear a medical ID bracelet or chain. Carry a card that describes your condition. List the medications and doses you take on the card. You may get drowsy or dizzy. Do not drive, use machinery, or do anything that needs mental alertness until you know how this medication affects you. Do not stand up or sit up quickly, especially if you are an older patient. This reduces the risk of dizzy or fainting spells. Alcohol may interfere with the effects of this medication. Avoid alcoholic drinks. This medication may cause serious skin reactions. They can happen weeks to months after starting the medication. Contact your care team right away if you notice fevers or flu-like symptoms with a rash. The rash may be red or purple and then turn into blisters or peeling of the skin. Or, you might notice a red rash with swelling of the face, lips or lymph nodes in your neck or under your arms. Watch for new or worsening thoughts of suicide or depression. This includes sudden changes in mood, behaviors, or thoughts. These changes can happen at any time but are more common in the beginning of treatment or after a change in dose. Call your care team right away if you experience these thoughts or worsening depression. This medication may slow your child's growth if it is taken for a long time at high doses. Your care team will monitor your child's growth. Using this medication for a long time may weaken your bones. The risk of bone fractures may be increased. Talk to your care team about your bone health. Do not become pregnant while taking this medication. Hormone forms of birth control may not work as well with this medication. Talk to your care team about other forms of birth control. There is potential for serious harm to an unborn child. Tell your care  team right away if you think you might be pregnant. What side effects may I notice from receiving this medication? Side effects that you should report to your care team as soon as possible: Allergic reactions-skin rash, itching, hives, swelling of the face, lips, tongue, or throat High acid level-trouble breathing, unusual weakness or fatigue, confusion, headache, fast or irregular heartbeat, nausea, vomiting High ammonia level-unusual weakness or fatigue, confusion, loss of appetite, nausea, vomiting, seizures Fever that does not go away, decrease in sweat Kidney stones-blood in the urine, pain or trouble passing urine, pain in the lower back or sides Redness, blistering, peeling or loosening of the skin, including inside the mouth Sudden eye pain or change in vision such as blurry vision, seeing halos around lights, vision loss Thoughts of suicide or self-harm, worsening mood, feelings of depression Side effects that usually do not require medical attention (report to your care team if they continue or are bothersome): Burning or tingling sensation in hands or feet Difficulty with paying attention, memory, or speech Dizziness Drowsiness Fatigue Loss of appetite with weight loss Slow or sluggish movements of the body This list may not describe all possible side effects. Call your doctor for medical advice about side effects. You may report side effects to FDA at 1-800-FDA-1088. Where should I keep my medication? Keep out of the reach of children and pets. Store between 15 and 30 degrees C (59 and 86 degrees F). Protect from moisture. Keep the container  tightly closed. Get rid of any unused medication after the expiration date. To get rid of medications that are no longer needed or have expired: Take the medication to a medication take-back program. Check with your pharmacy or law enforcement to find a location. If you cannot return the medication, check the label or package insert to see if  the medication should be thrown out in the garbage or flushed down the toilet. If you are not sure, ask your care team. If it is safe to put it in the trash, empty the medication out of the container. Mix the medication with cat litter, dirt, coffee grounds, or other unwanted substance. Seal the mixture in a bag or container. Put it in the trash. NOTE: This sheet is a summary. It may not cover all possible information. If you have questions about this medicine, talk to your doctor, pharmacist, or health care provider.  2022 Elsevier/Gold Standard (2021-01-14 12:37:42)  OnabotulinumtoxinA injection (Medical Use) What is this medication? ONABOTULINUMTOXINA (o na BOTT you lye num tox in eh) is a neuro-muscular blocker. This medicine is used to treat crossed eyes, eyelid spasms, severe neck muscle spasms, ankle and toe muscle spasms, and elbow, wrist, and finger muscle spasms. It is also used to treat excessive underarm sweating, to prevent chronic migraine headaches, and to treat loss of bladder control due to neurologic conditions such as multiple sclerosis or spinal cord injury. This medicine may be used for other purposes; ask your health care provider or pharmacist if you have questions. COMMON BRAND NAME(S): Botox What should I tell my care team before I take this medication? They need to know if you have any of these conditions: breathing problems cerebral palsy spasms difficulty urinating heart problems history of surgery where this medicine is going to be used infection at the site where this medicine is going to be used myasthenia gravis or other neurologic disease nerve or muscle disease surgery plans take medicines that treat or prevent blood clots thyroid problems an unusual or allergic reaction to botulinum toxin, albumin, other medicines, foods, dyes, or preservatives pregnant or trying to get pregnant breast-feeding How should I use this medication? This medicine is for  injection into a muscle. It is given by a health care professional in a hospital or clinic setting. Talk to your pediatrician regarding the use of this medicine in children. While this drug may be prescribed for children as young as 60 years old for selected conditions, precautions do apply. Overdosage: If you think you have taken too much of this medicine contact a poison control center or emergency room at once. NOTE: This medicine is only for you. Do not share this medicine with others. What if I miss a dose? This does not apply. What may interact with this medication? aminoglycoside antibiotics like gentamicin, neomycin, tobramycin muscle relaxants other botulinum toxin injections This list may not describe all possible interactions. Give your health care provider a list of all the medicines, herbs, non-prescription drugs, or dietary supplements you use. Also tell them if you smoke, drink alcohol, or use illegal drugs. Some items may interact with your medicine. What should I watch for while using this medication? Visit your doctor for regular check ups. This medicine will cause weakness in the muscle where it is injected. Tell your doctor if you feel unusually weak in other muscles. Get medical help right away if you have problems with breathing, swallowing, or talking. This medicine might make your eyelids droop or make you see  blurry or double. If you have weak muscles or trouble seeing do not drive a car, use machinery, or do other dangerous activities. This medicine contains albumin from human blood. It may be possible to pass an infection in this medicine, but no cases have been reported. Talk to your doctor about the risks and benefits of this medicine. If your activities have been limited by your condition, go back to your regular routine slowly after treatment with this medicine. What side effects may I notice from receiving this medication? Side effects that you should report to your  doctor or health care professional as soon as possible: allergic reactions like skin rash, itching or hives, swelling of the face, lips, or tongue breathing problems changes in vision chest pain or tightness eye irritation, pain fast, irregular heartbeat infection numbness speech problems swallowing problems unusual weakness Side effects that usually do not require medical attention (report to your doctor or health care professional if they continue or are bothersome): bruising or pain at site where injected drooping eyelid dry eyes or mouth headache muscles aches, pains sensitivity to light tearing This list may not describe all possible side effects. Call your doctor for medical advice about side effects. You may report side effects to FDA at 1-800-FDA-1088. Where should I keep my medication? This drug is given in a hospital or clinic and will not be stored at home. NOTE: This sheet is a summary. It may not cover all possible information. If you have questions about this medicine, talk to your doctor, pharmacist, or health care provider.  2022 Elsevier/Gold Standard (2018-05-30 14:21:42) Galcanezumab injection What is this medication? GALCANEZUMAB (gal ka NEZ ue mab) is used to prevent migraines and treat cluster headaches. This medicine may be used for other purposes; ask your health care provider or pharmacist if you have questions. COMMON BRAND NAME(S): Emgality What should I tell my care team before I take this medication? They need to know if you have any of these conditions: an unusual or allergic reaction to galcanezumab, other medicines, foods, dyes, or preservatives pregnant or trying to get pregnant breast-feeding How should I use this medication? This medicine is for injection under the skin. You will be taught how to prepare and give this medicine. Use exactly as directed. Take your medicine at regular intervals. Do not take your medicine more often than  directed. It is important that you put your used needles and syringes in a special sharps container. Do not put them in a trash can. If you do not have a sharps container, call your pharmacist or healthcare provider to get one. Talk to your pediatrician regarding the use of this medicine in children. Special care may be needed. Overdosage: If you think you have taken too much of this medicine contact a poison control center or emergency room at once. NOTE: This medicine is only for you. Do not share this medicine with others. What if I miss a dose? If you miss a dose, take it as soon as you can. If it is almost time for your next dose, take only that dose. Do not take double or extra doses. What may interact with this medication? Interactions are not expected. This list may not describe all possible interactions. Give your health care provider a list of all the medicines, herbs, non-prescription drugs, or dietary supplements you use. Also tell them if you smoke, drink alcohol, or use illegal drugs. Some items may interact with your medicine. What should I watch for while  using this medication? Tell your doctor or healthcare professional if your symptoms do not start to get better or if they get worse. What side effects may I notice from receiving this medication? Side effects that you should report to your doctor or health care professional as soon as possible: allergic reactions like skin rash, itching or hives, swelling of the face, lips, or tongue Side effects that usually do not require medical attention (report these to your doctor or health care professional if they continue or are bothersome): pain, redness, or irritation at site where injected This list may not describe all possible side effects. Call your doctor for medical advice about side effects. You may report side effects to FDA at 1-800-FDA-1088. Where should I keep my medication? Keep out of the reach of children. You will be  instructed on how to store this medicine. Throw away any unused medicine after the expiration date on the label. NOTE: This sheet is a summary. It may not cover all possible information. If you have questions about this medicine, talk to your doctor, pharmacist, or health care provider.  2022 Elsevier/Gold Standard (2018-05-11 12:03:23) Rolanda Lundborg injection What is this medication? FREMANEZUMAB (fre ma NEZ ue mab) is used to prevent migraine headaches. This medicine may be used for other purposes; ask your health care provider or pharmacist if you have questions. COMMON BRAND NAME(S): AJOVY What should I tell my care team before I take this medication? They need to know if you have any of these conditions: an unusual or allergic reaction to fremanezumab, other medicines, foods, dyes, or preservatives pregnant or trying to get pregnant breast-feeding How should I use this medication? This medicine is for injection under the skin. You will be taught how to prepare and give this medicine. Use exactly as directed. Take your medicine at regular intervals. Do not take your medicine more often than directed. It is important that you put your used needles and syringes in a special sharps container. Do not put them in a trash can. If you do not have a sharps container, call your pharmacist or healthcare provider to get one. Talk to your pediatrician regarding the use of this medicine in children. Special care may be needed. Overdosage: If you think you have taken too much of this medicine contact a poison control center or emergency room at once. NOTE: This medicine is only for you. Do not share this medicine with others. What if I miss a dose? If you miss a dose, take it as soon as you can. If it is almost time for your next dose, take only that dose. Do not take double or extra doses. What may interact with this medication? Interactions are not expected. This list may not describe all possible  interactions. Give your health care provider a list of all the medicines, herbs, non-prescription drugs, or dietary supplements you use. Also tell them if you smoke, drink alcohol, or use illegal drugs. Some items may interact with your medicine. What should I watch for while using this medication? Tell your doctor or healthcare professional if your symptoms do not start to get better or if they get worse. What side effects may I notice from receiving this medication? Side effects that you should report to your doctor or health care professional as soon as possible: allergic reactions like skin rash, itching or hives, swelling of the face, lips, or tongue Side effects that usually do not require medical attention (report these to your doctor or health care  professional if they continue or are bothersome): pain, redness, or irritation at site where injected This list may not describe all possible side effects. Call your doctor for medical advice about side effects. You may report side effects to FDA at 1-800-FDA-1088. Where should I keep my medication? Keep out of the reach of children. You will be instructed on how to store this medicine. Throw away any unused medicine after the expiration date on the label. NOTE: This sheet is a summary. It may not cover all possible information. If you have questions about this medicine, talk to your doctor, pharmacist, or health care provider.  2022 Elsevier/Gold Standard (2017-08-23 17:22:56)

## 2021-08-11 ENCOUNTER — Encounter: Payer: Self-pay | Admitting: Neurology

## 2021-08-11 DIAGNOSIS — G43009 Migraine without aura, not intractable, without status migrainosus: Secondary | ICD-10-CM | POA: Insufficient documentation

## 2021-08-11 DIAGNOSIS — G43709 Chronic migraine without aura, not intractable, without status migrainosus: Secondary | ICD-10-CM | POA: Insufficient documentation

## 2021-08-20 ENCOUNTER — Encounter (INDEPENDENT_AMBULATORY_CARE_PROVIDER_SITE_OTHER): Payer: Self-pay | Admitting: Family Medicine

## 2021-08-20 ENCOUNTER — Ambulatory Visit (INDEPENDENT_AMBULATORY_CARE_PROVIDER_SITE_OTHER): Payer: BC Managed Care – PPO | Admitting: Family Medicine

## 2021-08-20 ENCOUNTER — Ambulatory Visit: Payer: Self-pay | Admitting: Physician Assistant

## 2021-08-20 ENCOUNTER — Other Ambulatory Visit: Payer: Self-pay

## 2021-08-20 VITALS — BP 102/66 | HR 84 | Temp 98.0°F | Ht 62.0 in | Wt 154.0 lb

## 2021-08-20 DIAGNOSIS — R7301 Impaired fasting glucose: Secondary | ICD-10-CM

## 2021-08-20 DIAGNOSIS — F908 Attention-deficit hyperactivity disorder, other type: Secondary | ICD-10-CM

## 2021-08-20 DIAGNOSIS — E669 Obesity, unspecified: Secondary | ICD-10-CM

## 2021-08-20 DIAGNOSIS — G43809 Other migraine, not intractable, without status migrainosus: Secondary | ICD-10-CM

## 2021-08-20 DIAGNOSIS — Z6834 Body mass index (BMI) 34.0-34.9, adult: Secondary | ICD-10-CM

## 2021-08-20 DIAGNOSIS — Z9189 Other specified personal risk factors, not elsewhere classified: Secondary | ICD-10-CM

## 2021-08-20 MED ORDER — AMPHETAMINE-DEXTROAMPHETAMINE 15 MG PO TABS
15.0000 mg | ORAL_TABLET | Freq: Every day | ORAL | 0 refills | Status: DC
Start: 2021-10-19 — End: 2022-02-19

## 2021-08-20 MED ORDER — AMPHETAMINE-DEXTROAMPHETAMINE 15 MG PO TABS
15.0000 mg | ORAL_TABLET | Freq: Every day | ORAL | 0 refills | Status: DC
Start: 2021-09-19 — End: 2021-11-26

## 2021-08-20 MED ORDER — TIRZEPATIDE 2.5 MG/0.5ML ~~LOC~~ SOAJ: 2.5000 mg | SUBCUTANEOUS | 0 refills | Status: DC

## 2021-08-20 MED ORDER — AMPHETAMINE-DEXTROAMPHETAMINE 15 MG PO TABS: 15.0000 mg | ORAL_TABLET | Freq: Every day | ORAL | 0 refills | Status: DC

## 2021-08-20 NOTE — Progress Notes (Signed)
Chief Complaint:   OBESITY Autumn [name] is here to discuss her progress with her obesity treatment plan along with follow-up of her obesity related diagnoses.   Today's visit was #: 15 Starting weight: 187 lbs Starting date: 07/09/2020 Today's weight: 154 lbs Today's date: 08/20/2021 Weight change since last visit: 8 lbs Total lbs lost to date: 22 lbs Body mass index is 28.17 kg/m.  Total weight loss percentage to date: -17.65%  Current Meal Plan: the Category 2 Plan for 65-70% of the time.  Current Exercise Plan: Walking for 30-45 minutes 4 times per week. Current Anti-Obesity Medications: Mounjaro 2.5 mg subcutaneously weekly. Side effects: None.  Interim History:  Autumn [name] says, "Mounjaro for the win".  She has been seeing Dr. Jaynee Eagles in Neurology on her migraines.  She says headaches have increased to every other day, but are not always migraines.  She is still seeing a therapist.  She says that IR Adderall is helping to decrease insomnia.   Assessment/Plan:   1. Impaired fasting glucose, with polyphagia Controlled. Current treatment: Mounjaro 2.5 mg subcutaneously weekly. She will continue to focus on protein-rich, low simple carbohydrate foods. We reviewed the importance of hydration, regular exercise for stress reduction, and restorative sleep.  Plan:  Continue Mounjaro 2.5 mg subcutaneously weekly.  Will refill today.  2. Other migraine without status migrainosus, not intractable Autumn [name] is followed by Neurology.  We will continue to monitor as it relates to her weight loss journey.  3. Attention deficit hyperactivity disorder (ADHD), other type Autumn [name] is taking Adderall 15 mg daily and says this is helping her.  Plan:  Continue Adderall.  Will refill today for 3 months at current dose.  I have consulted the [place] Controlled Substances Registry for this patient, and feel the risk/benefit ratio today is favorable for proceeding with this prescription for a controlled substance. The  patient understands monitoring parameters and red flags.   4. At risk for deficient intake of food Autumn [name] was given extensive education and counseling today of more than 8 minutes on risks associated with deficient food intake.  Counseled her on the importance of following our prescribed meal plan and eating adequate amounts of protein.    5. Obesity, current BMI 28.3  Course: Autumn [name] is currently in the action stage of change. As such, her goal is to continue with weight loss efforts.   Nutrition goals: She has agreed to the Category 2 Plan.   Exercise goals: For substantial health benefits, adults should do at least 150 minutes (2 hours and 30 minutes) a week of moderate-intensity, or 75 minutes (1 hour and 15 minutes) a week of vigorous-intensity aerobic physical activity, or an equivalent combination of moderate- and vigorous-intensity aerobic activity. Aerobic activity should be performed in episodes of at least 10 minutes, and preferably, it should be spread throughout the week.  Behavioral modification strategies: increasing lean protein intake, decreasing simple carbohydrates, increasing vegetables, and increasing water intake.  Autumn [name] has agreed to follow-up with our clinic in 4 weeks. She was informed of the importance of frequent follow-up visits to maximize her success with intensive lifestyle modifications for her multiple health conditions.   Objective:   Blood pressure 102/66, pulse 84, temperature 98 F (36.7 C), temperature source Oral, height '5\' 2"'$  (1.575 m), weight 154 lb (69.9 kg), last menstrual period 03/20/2020, SpO2 100 %. Body mass index is 28.17 kg/m.  General: Cooperative, alert, well developed, in no acute distress. HEENT: Conjunctivae and lids unremarkable. Cardiovascular: Regular rhythm.  Lungs: Normal work of breathing. Neurologic: No focal deficits.   Lab Results  Component Value Date   CREATININE 0.71 11/11/2020   BUN 12 11/11/2020   NA 139 11/11/2020    K 4.2 11/11/2020   CL 103 11/11/2020   CO2 25 11/11/2020   Lab Results  Component Value Date   ALT 13 11/11/2020   AST 16 11/11/2020   ALKPHOS 83 11/11/2020   BILITOT 0.4 11/11/2020   Lab Results  Component Value Date   TSH 3.960 11/11/2020   Lab Results  Component Value Date   CHOL 197 07/09/2020   HDL 55 07/09/2020   LDLCALC 125 07/09/2020   TRIG 92 07/09/2020   Lab Results  Component Value Date   VD25OH 32.9 11/11/2020   VD25OH 30.64 08/24/2017   Lab Results  Component Value Date   WBC 6.2 06/16/2021   HGB 13.5 06/16/2021   HCT 41.1 06/16/2021   MCV 92 06/16/2021   PLT 219 06/16/2021   Lab Results  Component Value Date   IRON 80 06/16/2021   TIBC 316 06/16/2021   FERRITIN 15 06/16/2021   Attestation Statements:   Reviewed by clinician on day of visit: allergies, medications, problem list, medical history, surgical history, family history, social history, and previous encounter notes.  I, Water quality scientist, CMA, am acting as transcriptionist for Briscoe Deutscher, DO  I have reviewed the above documentation for accuracy and completeness, and I agree with the above. Briscoe Deutscher, DO

## 2021-08-27 ENCOUNTER — Other Ambulatory Visit (INDEPENDENT_AMBULATORY_CARE_PROVIDER_SITE_OTHER): Payer: Self-pay | Admitting: Family Medicine

## 2021-08-27 DIAGNOSIS — F411 Generalized anxiety disorder: Secondary | ICD-10-CM | POA: Diagnosis not present

## 2021-09-02 ENCOUNTER — Other Ambulatory Visit: Payer: Self-pay | Admitting: Obstetrics & Gynecology

## 2021-09-02 DIAGNOSIS — N6489 Other specified disorders of breast: Secondary | ICD-10-CM

## 2021-09-03 ENCOUNTER — Ambulatory Visit
Admission: RE | Admit: 2021-09-03 | Discharge: 2021-09-03 | Disposition: A | Discharge status: Home / Self Care | Payer: BC Managed Care – PPO | Source: Ambulatory Visit | Attending: Family Medicine | Admitting: Family Medicine

## 2021-09-03 ENCOUNTER — Other Ambulatory Visit: Payer: Self-pay | Admitting: Obstetrics & Gynecology

## 2021-09-03 ENCOUNTER — Other Ambulatory Visit: Payer: Self-pay

## 2021-09-03 DIAGNOSIS — N6489 Other specified disorders of breast: Secondary | ICD-10-CM

## 2021-09-03 DIAGNOSIS — R922 Inconclusive mammogram: Secondary | ICD-10-CM | POA: Diagnosis not present

## 2021-09-17 DIAGNOSIS — L718 Other rosacea: Secondary | ICD-10-CM | POA: Diagnosis not present

## 2021-09-17 DIAGNOSIS — L723 Sebaceous cyst: Secondary | ICD-10-CM | POA: Diagnosis not present

## 2021-09-17 DIAGNOSIS — Z85820 Personal history of malignant melanoma of skin: Secondary | ICD-10-CM | POA: Diagnosis not present

## 2021-09-17 DIAGNOSIS — L814 Other melanin hyperpigmentation: Secondary | ICD-10-CM | POA: Diagnosis not present

## 2021-09-24 ENCOUNTER — Ambulatory Visit (INDEPENDENT_AMBULATORY_CARE_PROVIDER_SITE_OTHER): Payer: BC Managed Care – PPO | Admitting: Family Medicine

## 2021-09-24 ENCOUNTER — Encounter (INDEPENDENT_AMBULATORY_CARE_PROVIDER_SITE_OTHER): Payer: Self-pay | Admitting: Family Medicine

## 2021-09-24 ENCOUNTER — Other Ambulatory Visit: Payer: Self-pay

## 2021-09-24 ENCOUNTER — Encounter (INDEPENDENT_AMBULATORY_CARE_PROVIDER_SITE_OTHER): Payer: Self-pay

## 2021-09-24 VITALS — BP 104/65 | HR 73 | Temp 97.8°F | Ht 62.0 in | Wt 150.0 lb

## 2021-09-24 DIAGNOSIS — R5381 Other malaise: Secondary | ICD-10-CM | POA: Diagnosis not present

## 2021-09-24 DIAGNOSIS — E538 Deficiency of other specified B group vitamins: Secondary | ICD-10-CM

## 2021-09-24 DIAGNOSIS — R790 Abnormal level of blood mineral: Secondary | ICD-10-CM | POA: Diagnosis not present

## 2021-09-24 DIAGNOSIS — Z6830 Body mass index (BMI) 30.0-30.9, adult: Secondary | ICD-10-CM

## 2021-09-24 DIAGNOSIS — R7301 Impaired fasting glucose: Secondary | ICD-10-CM

## 2021-09-24 DIAGNOSIS — R5383 Other fatigue: Secondary | ICD-10-CM

## 2021-09-24 DIAGNOSIS — R6889 Other general symptoms and signs: Secondary | ICD-10-CM | POA: Diagnosis not present

## 2021-09-24 DIAGNOSIS — E669 Obesity, unspecified: Secondary | ICD-10-CM

## 2021-09-24 MED ORDER — TIRZEPATIDE 2.5 MG/0.5ML ~~LOC~~ SOAJ: 2.5000 mg | SUBCUTANEOUS | 0 refills | Status: DC

## 2021-09-25 LAB — CBC WITH DIFFERENTIAL/PLATELET
Basophils Absolute: 0 10*3/uL (ref 0.0–0.2)
Basos: 0 %
EOS (ABSOLUTE): 0.1 10*3/uL (ref 0.0–0.4)
Eos: 1 %
Hemoglobin: 13 g/dL (ref 11.1–15.9)
Immature Grans (Abs): 0 10*3/uL (ref 0.0–0.1)
Immature Granulocytes: 0 %
Lymphocytes Absolute: 2.7 10*3/uL (ref 0.7–3.1)
Lymphs: 39 %
MCH: 29.7 pg (ref 26.6–33.0)
MCHC: 33.7 g/dL (ref 31.5–35.7)
MCV: 88 fL (ref 79–97)
Monocytes Absolute: 0.4 10*3/uL (ref 0.1–0.9)
Monocytes: 6 %
Neutrophils Absolute: 3.7 10*3/uL (ref 1.4–7.0)
Neutrophils: 54 %
Platelets: 223 10*3/uL (ref 150–450)
RBC: 4.38 x10E6/uL (ref 3.77–5.28)
RDW: 13 % (ref 11.7–15.4)
WBC: 6.9 10*3/uL (ref 3.4–10.8)

## 2021-09-25 LAB — ANEMIA PANEL
Ferritin: 20 ng/mL (ref 15–150)
Folate, Hemolysate: 345 ng/mL
Folate, RBC: 894 ng/mL (ref >498)
Hematocrit: 38.6 % (ref 34.0–46.6)
Iron Saturation: 13 % — ABNORMAL LOW (ref 15–55)
Iron: 51 ug/dL (ref 27–159)
Retic Ct Pct: 1.3 % (ref 0.6–2.6)
Total Iron Binding Capacity: 380 ug/dL (ref 250–450)
UIBC: 329 ug/dL (ref 131–425)
Vitamin B-12: 417 pg/mL (ref 232–1245)

## 2021-09-26 ENCOUNTER — Other Ambulatory Visit: Payer: Self-pay

## 2021-09-26 ENCOUNTER — Encounter: Payer: Self-pay | Admitting: Physician Assistant

## 2021-09-26 ENCOUNTER — Ambulatory Visit (INDEPENDENT_AMBULATORY_CARE_PROVIDER_SITE_OTHER): Payer: BC Managed Care – PPO | Admitting: Physician Assistant

## 2021-09-26 DIAGNOSIS — R7989 Other specified abnormal findings of blood chemistry: Secondary | ICD-10-CM

## 2021-09-26 DIAGNOSIS — F329 Major depressive disorder, single episode, unspecified: Secondary | ICD-10-CM

## 2021-09-26 DIAGNOSIS — Z79899 Other long term (current) drug therapy: Secondary | ICD-10-CM | POA: Diagnosis not present

## 2021-09-26 MED ORDER — VITAMIN D (ERGOCALCIFEROL) 1.25 MG (50000 UNIT) PO CAPS: 50000.0000 [IU] | ORAL_CAPSULE | ORAL | 1 refills | Status: DC

## 2021-09-26 MED ORDER — BUPROPION HCL ER (XL) 150 MG PO TB24: 150.0000 mg | ORAL_TABLET | Freq: Every day | ORAL | 1 refills | Status: DC

## 2021-09-26 NOTE — Progress Notes (Signed)
[facility]  Patient ID: Autumn [name],  MRN: [id number]  PCP: Fanny Bien, MD  Date of Evaluation: 09/26/2021 Time spent:40 minutes  Chief Complaint:  Chief Complaint   Depression; ADHD; Follow-up     HISTORY/CURRENT STATUS: HPI last seen 12/31/2020.  Has SAD, does get emotional at times, cried when the [place] died, it depends on the day and thoughts going on as to how emotional she is.  Has lack of motivation, doesn't have any joy.  Does not have a lot of energy either.  She does do things that absolutely have to be done but she does want to go home and nap if at all possible.  She knows that the way she is feeling is probably caused by combination of things including perimenopause, having 2 teenagers, age, fatigue and general, and brain chemistry.  Seasonal affective disorder happens every year ago when she is trying to head it off now before it gets too bad. Denies suicidal or homicidal thoughts.  She is on Adderall for ADD, energy, and motivation.  It is helpful at least a little bit.  She cannot imagine how she would feel if she was not taking it.  She works part-time and is able to do that currently because she is on the Adderall.  Patient denies increased energy with decreased need for sleep, no increased talkativeness, no racing thoughts, no impulsivity or risky behaviors, no increased spending, no increased libido, no grandiosity, no increased irritability or anger, and no hallucinations.  States that attention is good without easy distractibility.  Able to focus on things and finish tasks to completion.   Review of Systems  Constitutional:  Positive for malaise/fatigue.  HENT: Negative.    Eyes: Negative.   Respiratory: Negative.    Cardiovascular: Negative.   Gastrointestinal: Negative.   Genitourinary: Negative.   Musculoskeletal: Negative.   Skin: Negative.   Neurological: Negative.   Endo/Heme/Allergies: Negative.    Psychiatric/Behavioral:         See above.    Individual Medical History/ Review of Systems: Changes? :Yes   She's seeing Dr. Briscoe Deutscher at [facility], who Rx Adderall.   Past medications for mental health diagnoses include: Effexor helped body aches and pains, Vyvanse, Zoloft and Lexapro caused wt gain and sexual SE, Wellbutrin, Phentermine, Adderall, Adzenys, Cotempla, Mydayis  Allergies: Patient has no known allergies.  Current Medications:  Current Outpatient Medications:    amphetamine-dextroamphetamine (ADDERALL) 15 MG tablet, Take 1 tablet by mouth daily before breakfast., Disp: 30 tablet, Rfl: 0   [START ON 10/19/2021] amphetamine-dextroamphetamine (ADDERALL) 15 MG tablet, Take 1 tablet by mouth daily before breakfast., Disp: 30 tablet, Rfl: 0   buPROPion (WELLBUTRIN XL) 150 MG 24 hr tablet, Take 1 tablet (150 mg total) by mouth daily., Disp: 30 tablet, Rfl: 1   ferrous sulfate 325 (65 FE) MG tablet, Take 325 mg by mouth daily with breakfast., Disp: , Rfl:    Multiple Vitamin (MULTIVITAMIN WITH MINERALS) TABS tablet, Take 1 tablet by mouth daily., Disp: , Rfl:    tirzepatide (MOUNJARO) 2.5 MG/0.5ML Pen, Inject 2.5 mg into the skin once a week., Disp: 6 mL, Rfl: 0   Ubrogepant (UBRELVY) 100 MG TABS, Take 100 mg by mouth daily as needed (migraine)., Disp: 30 tablet, Rfl: 0   Vitamin D, Ergocalciferol, (DRISDOL) 1.25 MG (50000 UNIT) CAPS capsule, Take 1 capsule (50,000 Units total) by mouth every 7 (seven) days., Disp: 12 capsule, Rfl: 1 Medication Side Effects: none  Family  Medical/ Social History: Changes? No  MENTAL HEALTH EXAM:  Last menstrual period 03/20/2020.There is no height or weight on file to calculate BMI.  General Appearance: Casual, Neat, and Well Groomed  Eye Contact:  Good  Speech:  Clear and Coherent and Normal Rate  Volume:  Normal  Mood:  Depressed  Affect:  Depressed  Thought Process:  Goal Directed and Descriptions of Associations:  Circumstantial  Orientation:  Full (Time, Place, and Person)  Thought Content: Logical   Suicidal Thoughts:  No  Homicidal Thoughts:  No  Memory:  WNL  Judgement:  Good  Insight:  Good  Psychomotor Activity:  Normal  Concentration:  Concentration: Good  Recall:  Good  Fund of Knowledge: Good  Language: Good  Assets:  Desire for Improvement  ADL's:  Intact  Cognition: WNL  Prognosis:  Good   Labs 07/26/2021 CBC within normal limits BMP normal Lipid panel normal LFTs normal TSH normal  11/11/2020  Vit D 32.9,   DIAGNOSES:    ICD-10-CM   1. 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 Encounter for long-term (current) use of medications  Z79.899 VITAMIN D 25 Hydroxy (Vit-D Deficiency, Fractures)    3. Low vitamin D level  R79.89 VITAMIN D 25 Hydroxy (Vit-D Deficiency, Fractures)      Receiving Psychotherapy: No   Will make an appointment to get back in with Evelena Peat Deussing soon.   RECOMMENDATIONS:  PDMP reviewed.  Has received Vyvanse, Cotempla XR, and Adderall in the past year. I provided 40 minutes of face to face time during this encounter, including time spent before and after the visit in records review, medical decision making, counseling pertinent to today's visit, and charting.  Discussed different options.  I recommend Wellbutrin as it is a mild stimulant and will not only help the depression but should help with energy and motivation as well.  Benefits, risks, and side effects were discussed and patient accepts. We also discussed the use of a mood therapy lamp.  She will consider getting 1. Also recommend getting a vitamin D level.  She has had other labs recently (see chart) but if her vitamin D is low we will need to add prescription strength weekly doses. Mood therapy lamp. Start Wellbutrin XL 150 mg, 1 p.o. every morning. Continue Adderall 15 mg, 1 p.o. every morning per PCP. Start vitamin D 50,000 IUs 1 p.o. once a week.  She will hold that until she gets the lab  results and is notified start that.  She should be taking over-the-counter 5000 Ius regardless, unless told otherwise after lab results are back. Recommend she get back in therapy with Carolin Coy. Return in 4 to 6 weeks.  [name], PA-C

## 2021-09-29 DIAGNOSIS — R7989 Other specified abnormal findings of blood chemistry: Secondary | ICD-10-CM | POA: Diagnosis not present

## 2021-09-29 DIAGNOSIS — Z79899 Other long term (current) drug therapy: Secondary | ICD-10-CM | POA: Diagnosis not present

## 2021-09-30 LAB — VITAMIN D 25 HYDROXY (VIT D DEFICIENCY, FRACTURES): Vit D, 25-Hydroxy: 38 ng/mL (ref 30.0–100.0)

## 2021-09-30 NOTE — Progress Notes (Signed)
Please let her know the vitamin D level is still in low normal range at 38.  Have her go ahead and start the vitamin D 50,000 ius once a week, along with the OTC vitamin D 5000 IUs. At her follow-up visit next month we will discuss when to recheck the vitamin D level. Thank you.

## 2021-09-30 NOTE — Progress Notes (Signed)
Chief Complaint:   OBESITY Autumn [name] is here to discuss her progress with her obesity treatment plan along with follow-up of her obesity related diagnoses. See Medical Weight Management Flowsheet for complete bioelectrical impedance results.  Today's visit was #: 93 Starting weight: 187 lbs Starting date: 07/09/2020 Weight change since last visit: 4 lbs Total lbs lost to date: 37 lbs Total weight loss percentage to date: -19.79%  Nutrition Plan: Category 2 Meal Plan for 65% of the time. Activity: Walking for 30-45 minutes 3 times per week.  Anti-obesity medications: Mounjaro 2.5 mg subcutaneously weekly. Reported side effects: None.  Interim History: Autumn [name] says she has fatigue at baseline.  She is taking iron and has history of low ferritin and low vitamin B12.  She will see Autumn [name] in 2 weeks.  Assessment/Plan:   1. Impaired fasting glucose, with polyphagia Controlled. Current treatment: Mounjaro 2.5 mg subcutaneously weekly.    Plan:  Refill Mounjaro at same dose, as per below.  She will continue to focus on protein-rich, low simple carbohydrate foods. We reviewed the importance of hydration, regular exercise for stress reduction, and restorative sleep.  Will check labs today.  - Refill tirzepatide (MOUNJARO) 2.5 MG/0.5ML Pen; Inject 2.5 mg into the skin once a week.  Dispense: 6 mL; Refill: 0 - Anemia panel - CBC with Differential/Platelet  2. Low ferritin Autumn [name] is taking ferrous sulfate 325 mg daily.  Nutrition: Iron-rich foods include dark leafy greens, red and white meats, eggs, seafood, and beans.  Certain foods and drinks prevent your body from absorbing iron properly. Avoid eating these foods in the same meal as iron-rich foods or with iron supplements. These foods include: coffee, black tea, and red wine; milk, dairy products, and foods that are high in calcium; beans and soybeans; whole grains. Constipation can be a side effect of iron supplementation. Increased  water and fiber intake are helpful. Water goal: > 2 liters/day. Fiber goal: > 25 grams/day.  Will check labs today.  - Anemia panel - CBC with Differential/Platelet  3. B12 deficiency Lab Results  Component Value Date   VITAMINB12 417 09/24/2021   Supplementation: Multivitamin.   Plan:  Will check labs today.    - Anemia panel - CBC with Differential/Platelet  4. Malaise and fatigue Will check anemia panel and CBC today, as per below.  - Anemia panel - CBC with Differential/Platelet  5. Obesity, current BMI 27.5  Course: Autumn [name] is currently in the action stage of change. As such, her goal is to continue with weight loss efforts.   Nutrition goals: She has agreed to the Category 2 Plan.   Exercise goals:  As is.  Behavioral modification strategies: increasing lean protein intake, decreasing simple carbohydrates, and increasing vegetables.  Autumn [name] has agreed to follow-up with our clinic in 4 weeks. She was informed of the importance of frequent follow-up visits to maximize her success with intensive lifestyle modifications for her multiple health conditions.   Objective:   Blood pressure 104/65, pulse 73, temperature 97.8 F (36.6 C), temperature source Oral, height 5\' 2"  (1.575 m), weight 150 lb (68 kg), last menstrual period 03/20/2020, SpO2 100 %. Body mass index is 27.44 kg/m.  General: Cooperative, alert, well developed, in no acute distress. HEENT: Conjunctivae and lids unremarkable. Cardiovascular: Regular rhythm.  Lungs: Normal work of breathing. Neurologic: No focal deficits.   Lab Results  Component Value Date   CREATININE 0.8 07/26/2021   BUN 13 07/26/2021   NA 138 07/26/2021  K 4.3 07/26/2021   CL 102 07/26/2021   CO2 25 (A) 07/26/2021   Lab Results  Component Value Date   ALT 10 07/26/2021   AST 13 07/26/2021   ALKPHOS 57 07/26/2021   BILITOT 0.4 11/11/2020   Lab Results  Component Value Date   TSH 3.61 07/26/2021   Lab Results   Component Value Date   CHOL 187 07/26/2021   HDL 55 07/26/2021   LDLCALC 119 07/26/2021   TRIG 67 07/26/2021   Lab Results  Component Value Date   VD25OH 38.0 09/29/2021   VD25OH 32.9 11/11/2020   VD25OH 30.64 08/24/2017   Lab Results  Component Value Date   WBC 6.9 09/24/2021   HGB 13.0 09/24/2021   HCT 38.6 09/24/2021   MCV 88 09/24/2021   PLT 223 09/24/2021   Lab Results  Component Value Date   IRON 51 09/24/2021   TIBC 380 09/24/2021   FERRITIN 20 09/24/2021   Attestation Statements:   Reviewed by clinician on day of visit: allergies, medications, problem list, medical history, surgical history, family history, social history, and previous encounter notes.  I, Water quality scientist, CMA, am acting as transcriptionist for Briscoe Deutscher, DO  I have reviewed the above documentation for accuracy and completeness, and I agree with the above. -  Briscoe Deutscher, DO, MS, FAAFP, DABOM - Family and Bariatric Medicine.

## 2021-10-01 ENCOUNTER — Ambulatory Visit: Payer: BC Managed Care – PPO | Admitting: Physician Assistant

## 2021-10-01 NOTE — Progress Notes (Signed)
Pt informed

## 2021-10-10 ENCOUNTER — Ambulatory Visit: Payer: BC Managed Care – PPO | Admitting: Physician Assistant

## 2021-10-14 DIAGNOSIS — R0981 Nasal congestion: Secondary | ICD-10-CM | POA: Diagnosis not present

## 2021-10-14 DIAGNOSIS — J101 Influenza due to other identified influenza virus with other respiratory manifestations: Secondary | ICD-10-CM | POA: Diagnosis not present

## 2021-10-14 DIAGNOSIS — J069 Acute upper respiratory infection, unspecified: Secondary | ICD-10-CM | POA: Diagnosis not present

## 2021-10-14 DIAGNOSIS — Z20822 Contact with and (suspected) exposure to covid-19: Secondary | ICD-10-CM | POA: Diagnosis not present

## 2021-10-23 ENCOUNTER — Encounter (INDEPENDENT_AMBULATORY_CARE_PROVIDER_SITE_OTHER): Payer: Self-pay | Admitting: Family Medicine

## 2021-10-23 ENCOUNTER — Ambulatory Visit (INDEPENDENT_AMBULATORY_CARE_PROVIDER_SITE_OTHER): Payer: BC Managed Care – PPO | Admitting: Family Medicine

## 2021-10-23 ENCOUNTER — Other Ambulatory Visit: Payer: Self-pay

## 2021-10-23 VITALS — BP 94/62 | HR 68 | Temp 97.9°F | Ht 62.0 in | Wt 144.0 lb

## 2021-10-23 DIAGNOSIS — R5381 Other malaise: Secondary | ICD-10-CM

## 2021-10-23 DIAGNOSIS — R5383 Other fatigue: Secondary | ICD-10-CM

## 2021-10-23 DIAGNOSIS — Z6830 Body mass index (BMI) 30.0-30.9, adult: Secondary | ICD-10-CM

## 2021-10-23 DIAGNOSIS — E669 Obesity, unspecified: Secondary | ICD-10-CM | POA: Diagnosis not present

## 2021-10-23 DIAGNOSIS — R7301 Impaired fasting glucose: Secondary | ICD-10-CM

## 2021-10-24 ENCOUNTER — Ambulatory Visit (INDEPENDENT_AMBULATORY_CARE_PROVIDER_SITE_OTHER): Payer: BC Managed Care – PPO | Admitting: Physician Assistant

## 2021-10-24 ENCOUNTER — Encounter: Payer: Self-pay | Admitting: Physician Assistant

## 2021-10-24 DIAGNOSIS — R7989 Other specified abnormal findings of blood chemistry: Secondary | ICD-10-CM | POA: Diagnosis not present

## 2021-10-24 DIAGNOSIS — F329 Major depressive disorder, single episode, unspecified: Secondary | ICD-10-CM | POA: Diagnosis not present

## 2021-10-24 MED ORDER — B COMPLEX 50 PO TABS: 1.0000 | ORAL_TABLET | Freq: Every day | ORAL | 11 refills | Status: DC

## 2021-10-24 MED ORDER — FISH OIL 300 MG PO CAPS: 1.0000 | ORAL_CAPSULE | Freq: Every day | ORAL | 11 refills | Status: DC

## 2021-10-24 NOTE — Progress Notes (Signed)
[facility]  Patient ID: Autumn [name],  MRN: [id number]  PCP: Fanny Bien, MD  Date of Evaluation: 10/24/2021 Time spent:20 minutes  Chief Complaint:  Chief Complaint   Depression; Follow-up      HISTORY/CURRENT STATUS: HPI For medication recheck  Wasn't able to tolerate the Wellbutrin d/t dizziness. Doesn't feel like it was going to help anyway. Is sensitive to meds. Is able to enjoy things, has low energy and motivation. Never been a morning person but does not have to push herself to get up and do things. Still takes kids to school and things. Eating well, [facility] and that's going well. Sleeps ok, tosses and turns and has hot flashes some night. No SI/HI.   Vitamin D was low normal so she has been on the high-dose weekly vitamin D for probably about 3 weeks now.  She feels like it is already helping with mood and energy.  Dr. Juleen China recommended that she add B complex in.  She has not done that just yet, I think she saw Dr. Juleen China yesterday.  Patient wanted to also discuss with me. Adderall is still helping for energy, focus and attention.  Dr. Juleen China prescribes that.  Patient denies increased energy with decreased need for sleep, no increased talkativeness, no racing thoughts, no impulsivity or risky behaviors, no increased spending, no increased libido, no grandiosity, no increased irritability or anger, and no hallucinations.  Denies dizziness, syncope, seizures, numbness, tingling, tremor, tics, unsteady gait, slurred speech, confusion. Denies muscle or joint pain, stiffness, or dystonia.  Individual Medical History/ Review of Systems: Changes? :Yes   had flu since LOV  Past medications for mental health diagnoses include: Effexor helped body aches and pains, Vyvanse, Zoloft and Lexapro caused wt gain and sexual SE, Wellbutrin caused dizziness when tried again after years of not taking it, Phentermine, Adderall,  Adzenys, Cotempla, Mydayis  Allergies: Patient has no known allergies.  Current Medications:  Current Outpatient Medications:    amphetamine-dextroamphetamine (ADDERALL) 15 MG tablet, Take 1 tablet by mouth daily before breakfast., Disp: 30 tablet, Rfl: 0   B Complex Vitamins (B COMPLEX 50) TABS, Take 1 tablet by mouth daily., Disp: 30 tablet, Rfl: 11   ferrous sulfate 325 (65 FE) MG tablet, Take 325 mg by mouth daily with breakfast., Disp: , Rfl:    Multiple Vitamin (MULTIVITAMIN WITH MINERALS) TABS tablet, Take 1 tablet by mouth daily., Disp: , Rfl:    Omega-3 Fatty Acids (FISH OIL) 300 MG CAPS, Take 1 capsule (300 mg total) by mouth daily., Disp: 30 capsule, Rfl: 11   tirzepatide (MOUNJARO) 2.5 MG/0.5ML Pen, Inject 2.5 mg into the skin once a week., Disp: 6 mL, Rfl: 0   Ubrogepant (UBRELVY) 100 MG TABS, Take 100 mg by mouth daily as needed (migraine)., Disp: 30 tablet, Rfl: 0   Vitamin D, Ergocalciferol, (DRISDOL) 1.25 MG (50000 UNIT) CAPS capsule, Take 1 capsule (50,000 Units total) by mouth every 7 (seven) days., Disp: 12 capsule, Rfl: 1   amphetamine-dextroamphetamine (ADDERALL) 15 MG tablet, Take 1 tablet by mouth daily before breakfast., Disp: 30 tablet, Rfl: 0 Medication Side Effects: dizziness/lightheadedness from Wellbutrin  Family Medical/ Social History: Changes? No  MENTAL HEALTH EXAM:  Last menstrual period 03/20/2020.There is no height or weight on file to calculate BMI.  General Appearance: Casual, Neat, and Well Groomed  Eye Contact:  Good  Speech:  Clear and Coherent and Normal Rate  Volume:  Normal  Mood:  Euthymic  Affect:  Congruent  Thought Process:  Goal Directed and Descriptions of Associations: Circumstantial  Orientation:  Full (Time, Place, and Person)  Thought Content: Logical   Suicidal Thoughts:  No  Homicidal Thoughts:  No  Memory:  WNL  Judgement:  Good  Insight:  Good  Psychomotor Activity:  Normal  Concentration:  Concentration: Good  Recall:   Good  Fund of Knowledge: Good  Language: Good  Assets:  Desire for Improvement  ADL's:  Intact  Cognition: WNL  Prognosis:  Good   09/29/2021 Vitamin D38.0  DIAGNOSES:    ICD-10-CM   1. 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 Low vitamin D level  R79.89        Receiving Psychotherapy: No      RECOMMENDATIONS:  PDMP reviewed.  Adderall filled 10/02/2021 by another provider. I provided 20 minutes of face to face time during this encounter, including time spent before and after the visit in records review, medical decision making, and charting.  We decided not to add another antidepressant.  We think the vitamins will help, plus she is going through a lot of middle-aged women issues, aging parents, menopausal symptoms and the like. Discontinue Wellbutrin. She already has. Continue Adderall 15 mg, 1 p.o. every morning per Dr. Juleen China.   Start B complex, 1 daily. Start fish oil daily. Continue vitamin D 50,000 IUs 1 p.o. once a week.   She will have either Dr. Juleen China or Dr. Ernie Hew follow-up with the vitamin D labs and treatment.   Recommend she get back in therapy with Carolin Coy. Return as needed.  [name], PA-C

## 2021-10-27 MED ORDER — TIRZEPATIDE 2.5 MG/0.5ML ~~LOC~~ SOAJ: 2.5000 mg | SUBCUTANEOUS | 0 refills | Status: DC

## 2021-10-27 NOTE — Progress Notes (Signed)
Chief Complaint:   OBESITY Autumn [name] is here to discuss her progress with her obesity treatment plan along with follow-up of her obesity related diagnoses. See Medical Weight Management Flowsheet for complete bioelectrical impedance results.  Today's visit was #: 13 Starting weight: 187 lbs Starting date: 07/09/2020 Weight change since last visit: 6 lbs Total lbs lost to date: 43 lbs Total weight loss percentage to date: -22.99%  Nutrition Plan: Category 2 Plan for 60% of the time.  Activity: Walking for 30-45 minutes 3-4 times per week.  Anti-obesity medications: Mounjaro 2.5 mg subcutaneously weekly. Reported side effects: None.  Interim History: Autumn [name] says she had the flu 2 weeks ago.  She is happy with her current weight.  She says she is deciding how to continue Mounjaro going forward.  She may need to discontinue if unable to get enough calories.  Assessment/Plan:   1. Impaired fasting glucose, with polyphagia Controlled. Current treatment: Mounjaro 2.5 mg subcutaneously weekly.    Plan:  Continue Mounjaro 2.5 mg subcutaneously weekly.  Will refill today. She will continue to focus on protein-rich, low simple carbohydrate foods. We reviewed the importance of hydration, regular exercise for stress reduction, and restorative sleep.  2. Malaise and fatigue Autumn [name] had a recent illness.  We reviewed B12, ferritin deficiency.  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   3. Obesity, current BMI 26.5  Course: Autumn [name] is currently in the action stage of change. As such, her goal is to continue with weight loss efforts.   Nutrition goals: She has agreed to practicing portion control and making smarter food choices, such as increasing vegetables and decreasing simple carbohydrates.   Exercise goals:  As is.  Behavioral modification strategies: increasing lean protein intake, decreasing simple carbohydrates, increasing vegetables, and increasing water intake.  Autumn [name] has agreed to  follow-up with our clinic in 4 weeks. She was informed of the importance of frequent follow-up visits to maximize her success with intensive lifestyle modifications for her multiple health conditions.   Objective:   Blood pressure 94/62, pulse 68, temperature 97.9 F (36.6 C), temperature source Oral, height 5\' 2"  (1.575 m), weight 144 lb (65.3 kg), last menstrual period 03/20/2020, SpO2 100 %. Body mass index is 26.34 kg/m.  General: Cooperative, alert, well developed, in no acute distress. HEENT: Conjunctivae and lids unremarkable. Cardiovascular: Regular rhythm.  Lungs: Normal work of breathing. Neurologic: No focal deficits.   Lab Results  Component Value Date   CREATININE 0.8 07/26/2021   BUN 13 07/26/2021   NA 138 07/26/2021   K 4.3 07/26/2021   CL 102 07/26/2021   CO2 25 (A) 07/26/2021   Lab Results  Component Value Date   ALT 10 07/26/2021   AST 13 07/26/2021   ALKPHOS 57 07/26/2021   BILITOT 0.4 11/11/2020   Lab Results  Component Value Date   TSH 3.61 07/26/2021   Lab Results  Component Value Date   CHOL 187 07/26/2021   HDL 55 07/26/2021   LDLCALC 119 07/26/2021   TRIG 67 07/26/2021   Lab Results  Component Value Date   VD25OH 38.0 09/29/2021   VD25OH 32.9 11/11/2020   VD25OH 30.64 08/24/2017   Lab Results  Component Value Date   WBC 6.9 09/24/2021   HGB 13.0 09/24/2021   HCT 38.6 09/24/2021   MCV 88 09/24/2021   PLT 223 09/24/2021   Lab Results  Component Value Date   IRON 51 09/24/2021   TIBC 380 09/24/2021   FERRITIN 20 09/24/2021  Attestation Statements:   Reviewed by clinician on day of visit: allergies, medications, problem list, medical history, surgical history, family history, social history, and previous encounter notes.  Time spent on visit including pre-visit chart review and post-visit care and documentation was 35 minutes. Time was spent on: Food choices and timing of food intake reviewed today. I discussed a personalized  meal plan with the patient that will help her to lose weight and will improve her obesity-related conditions going forward. I performed a medically necessary appropriate examination and/or evaluation. I discussed the assessment and treatment plan with the patient. Motivational interviewing as well as evidence-based interventions for health behavior change were utilized today including the discussion of self monitoring techniques, problem-solving barriers and SMART goal setting techniques.  An exercise prescription was reviewed.  The patient was provided an opportunity to ask questions and all were answered. The patient agreed with the plan and demonstrated an understanding of the instructions. Clinical information was updated and documented in the EMR.  I, Water quality scientist, CMA, am acting as transcriptionist for Briscoe Deutscher, DO  I have reviewed the above documentation for accuracy and completeness, and I agree with the above. -  Briscoe Deutscher, DO, MS, FAAFP, DABOM - Family and Bariatric Medicine.

## 2021-11-13 ENCOUNTER — Other Ambulatory Visit: Payer: Self-pay

## 2021-11-13 ENCOUNTER — Ambulatory Visit (INDEPENDENT_AMBULATORY_CARE_PROVIDER_SITE_OTHER): Payer: BC Managed Care – PPO | Admitting: Obstetrics & Gynecology

## 2021-11-13 ENCOUNTER — Encounter (HOSPITAL_BASED_OUTPATIENT_CLINIC_OR_DEPARTMENT_OTHER): Payer: Self-pay | Admitting: Obstetrics & Gynecology

## 2021-11-13 VITALS — BP 125/86 | HR 81 | Ht 62.0 in | Wt 147.4 lb

## 2021-11-13 DIAGNOSIS — Z90710 Acquired absence of both cervix and uterus: Secondary | ICD-10-CM | POA: Diagnosis not present

## 2021-11-13 DIAGNOSIS — N898 Other specified noninflammatory disorders of vagina: Secondary | ICD-10-CM | POA: Diagnosis not present

## 2021-11-13 DIAGNOSIS — R232 Flushing: Secondary | ICD-10-CM

## 2021-11-13 MED ORDER — SULFAMETHOXAZOLE-TRIMETHOPRIM 800-160 MG PO TABS: 1.0000 | ORAL_TABLET | Freq: Two times a day (BID) | ORAL | 0 refills | Status: DC

## 2021-11-13 MED ORDER — FLUCONAZOLE 150 MG PO TABS: 150.0000 mg | ORAL_TABLET | Freq: Once | ORAL | 0 refills | Status: AC

## 2021-11-13 NOTE — Progress Notes (Signed)
GYNECOLOGY  VISIT  CC:   new vulvar finding  HPI: 45 y.o. G58P2002 Married White or Caucasian female here for complaint of vulvar lesion that she's noted.  She reports this is kind of tender/irritated. Worried that this might be recurrent rectocele and is a little nervous about that.  Denies vaginal bleeding.  Is having some discomfort with intercourse as well as hot flashes.  Is feeling a little bulge or cyst.  Has noted this mostly with bowel movements.  Really feels hot flashes are bothersome and would consider treatment if appropriate.  Had tried HRT in the past but wasn't sure she was fully menopausal at the time and stopped.  She is heterzygous for factor V Leiden.  Did see Dr. Marin Olp prior to starting HRT to ensure he felt this was safe.  She has never had a DVT/PE/clot and was on OCPs in the past as well as had no issues in pregnancy.  She would like to consider restarting HRT if appropriate.  She and I discussed doing lab work today as well.  Has tried SSRI in the past and had fatigue as a side effect.   GYNECOLOGIC HISTORY: Patient's last menstrual period was 03/20/2020. Contraception: hysterectomy Menopausal hormone therapy: none  Patient Active Problem List   Diagnosis Date Noted   Chronic migraine without aura without status migrainosus, not intractable 08/11/2021   History of hysterectomy 04/20/2021   Family history of malignant neoplasm of digestive organ 04/15/2021   Trochanteric bursitis of left hip 01/12/2020   Patellofemoral arthritis of right knee 05/11/2018   Lumbar radiculopathy 03/22/2018   Nonallopathic lesion of thoracic region 10/07/2017   Nonallopathic lesion of cervical region 10/07/2017   Nonallopathic lesion of lumbosacral region 10/07/2017   Piriformis syndrome of right side 08/24/2017   Polyarthropathy or polyarthritis of multiple sites 08/24/2017   Injury of digital nerve of left ring finger 08/02/2017   Mild episode of recurrent major depressive  disorder ([place]) 02/16/2017   Attention deficit hyperactivity disorder (ADHD) 02/16/2017   Psoriasis 02/16/2017   Heterozygous factor V Leiden mutation ([place]) 09/01/2016   Nephrolithiasis 06/07/2012   Headache, menstrual migraine 06/07/2012   Malignant melanoma ([place]) 06/07/2012    Past Medical History:  Diagnosis Date   Anemia    Anxiety    Attention deficit hyperactivity disorder (ADHD) 02/16/2017   Sees [facility] for managment   Back pain    Calculus of kidney 06/07/2012   Overview:  Dr Karsten Ro - urology    Cancer [facility])    melanoma removed from abdomen in 0626   Complication of anesthesia    low BP during C-section   Depression    Factor V Leiden carrier ([place])    heterozygous   Hernia, rectovaginal 06/07/2012   Overview:  Dr Corinna Capra - gyn    History of abnormal cervical Pap smear 1997   dysplasia- LEEP   History of reconstructive repair of rectocele    IBS (irritable bowel syndrome)    Joint pain    Lower extremity edema    Migraine    Ovarian cyst 1994   Psoriasis 02/16/2017   -sees dermatologist   Psoriatic arthritis [facility])     Past Surgical History:  Procedure Laterality Date   BLADDER SUSPENSION N/A 04/08/2020   Procedure: POSSIBLE TRANSVAGINAL TAPE (TVT) PROCEDURE MIDURETHAL SLING;  Surgeon: Nunzio Cobbs, MD;  Location: [place];  Service: Gynecology;  Laterality: N/A;   CERVICAL BIOPSY  W/ LOOP ELECTRODE EXCISION  CESAREAN SECTION  2007 & 2010   CYSTOSCOPY N/A 04/08/2020   Procedure: CYSTOSCOPY;  Surgeon: Nunzio Cobbs, MD;  Location: [facility];  Service: Gynecology;  Laterality: N/A;   KNEE ARTHROPLASTY Right [place] N/A 04/08/2020   Procedure: POSTERIOR REPAIR (RECTOCELE) WITH NATIVE TISSUE REPAIR;  Surgeon: Nunzio Cobbs, MD;  Location[facility];  Service: Gynecology;  Laterality: N/A;   SINUS ENDO WITH FUSION      TOTAL LAPAROSCOPIC HYSTERECTOMY WITH SALPINGECTOMY N/A 04/08/2020   Procedure: TOTAL LAPAROSCOPIC HYSTERECTOMY WITH BILATERAL SALPINGECTOMY;  Surgeon: Megan Salon, MD;  Location: [facility];  Service: Gynecology;  Laterality: N/A;   TUBAL LIGATION  2010   VULVA /PERINEUM BIOPSY  01/2015    MEDS:   Current Outpatient Medications on File Prior to Visit  Medication Sig Dispense Refill   amphetamine-dextroamphetamine (ADDERALL) 15 MG tablet Take 1 tablet by mouth daily before breakfast. 30 tablet 0   B Complex Vitamins (B COMPLEX 50) TABS Take 1 tablet by mouth daily. 30 tablet 11   ferrous sulfate 325 (65 FE) MG tablet Take 325 mg by mouth daily with breakfast.     Multiple Vitamin (MULTIVITAMIN WITH MINERALS) TABS tablet Take 1 tablet by mouth daily.     Omega-3 Fatty Acids (FISH OIL) 300 MG CAPS Take 1 capsule (300 mg total) by mouth daily. 30 capsule 11   tirzepatide (MOUNJARO) 2.5 MG/0.5ML Pen Inject 2.5 mg into the skin once a week. 6 mL 0   Ubrogepant (UBRELVY) 100 MG TABS Take 100 mg by mouth daily as needed (migraine). 30 tablet 0   Vitamin D, Ergocalciferol, (DRISDOL) 1.25 MG (50000 UNIT) CAPS capsule Take 1 capsule (50,000 Units total) by mouth every 7 (seven) days. 12 capsule 1   amphetamine-dextroamphetamine (ADDERALL) 15 MG tablet Take 1 tablet by mouth daily before breakfast. 30 tablet 0   No current facility-administered medications on file prior to visit.    ALLERGIES: Patient has no known allergies.  Family History  Problem Relation Age of Onset   Migraines Mother    Hyperlipidemia Mother    Hyperlipidemia Father    Cancer Father        throat   Diabetes Maternal Grandmother    Heart disease Maternal Grandmother    Emphysema Maternal Grandfather    Healthy Paternal Grandmother    Emphysema Paternal Grandfather    Healthy Son    Healthy Son    Breast cancer Maternal Aunt 76       stage 1   Esophageal cancer Maternal Uncle    Rectal cancer Maternal  Uncle     SH:  married, non smoker  Review of Systems  Genitourinary:        Vaginal lesion, irritation   PHYSICAL EXAMINATION:    BP 125/86 (BP Location: Right Arm, Patient Position: Sitting, Cuff Size: Normal)   Pulse 81   Ht 5\' 2"  (1.575 m)   Wt 147 lb 6.4 oz (66.9 kg)   LMP 03/20/2020   BMI 26.96 kg/m     General appearance: alert, cooperative and appears stated age Lymph:  no inguinal LAD noted  Pelvic: External genitalia:  no lesions              Urethra:  normal appearing urethra with no masses, tenderness or lesions              Bartholins  and Skenes: normal                 Vagina: normal appearing vagina with normal color and discharge, no lesions, no evidence of rectocele              Cervix: absent               Chaperone, Octaviano Batty, CMA, was present for exam.  Assessment/Plan: 1. Hot flashes - will check blood work today.  Consider restarting HRT of in menopausal range - Follicle stimulating hormone - Estradiol  2. Vaginal irritation/cyst - I did not find any cyst/bulge on exam today.  Possibly this could be vaginal dryness.  No lesions present.  She may benefit from improved vaginal moisture  3. History of hysterectomy

## 2021-11-14 LAB — ESTRADIOL: Estradiol: <5 pg/mL

## 2021-11-14 LAB — FOLLICLE STIMULATING HORMONE: FSH: 92.5 m[IU]/mL

## 2021-11-18 MED ORDER — ESTRADIOL 0.025 MG/24HR TD PTTW: 1.0000 | MEDICATED_PATCH | TRANSDERMAL | 3 refills | Status: DC

## 2021-11-19 ENCOUNTER — Ambulatory Visit (INDEPENDENT_AMBULATORY_CARE_PROVIDER_SITE_OTHER): Payer: BC Managed Care – PPO | Admitting: Obstetrics & Gynecology

## 2021-11-19 ENCOUNTER — Other Ambulatory Visit: Payer: Self-pay

## 2021-11-19 ENCOUNTER — Encounter (HOSPITAL_BASED_OUTPATIENT_CLINIC_OR_DEPARTMENT_OTHER): Payer: Self-pay | Admitting: Obstetrics & Gynecology

## 2021-11-19 VITALS — BP 102/60 | HR 78 | Ht 62.0 in | Wt 148.4 lb

## 2021-11-19 DIAGNOSIS — N8110 Cystocele, unspecified: Secondary | ICD-10-CM | POA: Diagnosis not present

## 2021-11-25 DIAGNOSIS — N8110 Cystocele, unspecified: Secondary | ICD-10-CM | POA: Insufficient documentation

## 2021-11-25 NOTE — Progress Notes (Signed)
GYNECOLOGY  VISIT  CC:   vaginal bulge  HPI: 45 y.o. G40P2002 Married White or Caucasian female here for complaint of a bulge that she's noticed.  She was seen 12/8 and also complained of this.  I could not find any vaginal abnormality on exam.  She has thought about her symptoms and is feeling it much more with standing and towards the end of the day.  Denies bladder issues.  Denies urinary leakage.  Denies vaginal bleeding.  GYNECOLOGIC HISTORY: Patient's last menstrual period was 03/20/2020. Contraception: hysterectomy Menopausal hormone therapy: has restarted HRT  Patient Active Problem List   Diagnosis Date Noted   Chronic migraine without aura without status migrainosus, not intractable 08/11/2021   History of hysterectomy 04/20/2021   Family history of malignant neoplasm of digestive organ 04/15/2021   Trochanteric bursitis of left hip 01/12/2020   Patellofemoral arthritis of right knee 05/11/2018   Lumbar radiculopathy 03/22/2018   Nonallopathic lesion of thoracic region 10/07/2017   Nonallopathic lesion of cervical region 10/07/2017   Nonallopathic lesion of lumbosacral region 10/07/2017   Piriformis syndrome of right side 08/24/2017   Polyarthropathy or polyarthritis of multiple sites 08/24/2017   Injury of digital nerve of left ring finger 08/02/2017   Mild episode of recurrent major depressive disorder ([place]) 02/16/2017   Attention deficit hyperactivity disorder (ADHD) 02/16/2017   Psoriasis 02/16/2017   Heterozygous factor V Leiden mutation ([place]) 09/01/2016   Nephrolithiasis 06/07/2012   Headache, menstrual migraine 06/07/2012   Malignant melanoma ([place]) 06/07/2012    Past Medical History:  Diagnosis Date   Anemia    Anxiety    Attention deficit hyperactivity disorder (ADHD) 02/16/2017   Sees [facility] for managment   Back pain    Calculus of kidney 06/07/2012   Overview:  Dr Karsten Ro - urology    Cancer [facility])    melanoma removed from abdomen in  0034   Complication of anesthesia    low BP during C-section   Depression    Factor V Leiden carrier ([place])    heterozygous   Hernia, rectovaginal 06/07/2012   Overview:  Dr Corinna Capra - gyn    History of abnormal cervical Pap smear 1997   dysplasia- LEEP   History of reconstructive repair of rectocele    IBS (irritable bowel syndrome)    Joint pain    Lower extremity edema    Migraine    Ovarian cyst 1994   Psoriasis 02/16/2017   -sees dermatologist   Psoriatic arthritis [facility])     Past Surgical History:  Procedure Laterality Date   BLADDER SUSPENSION N/A 04/08/2020   Procedure: POSSIBLE TRANSVAGINAL TAPE (TVT) PROCEDURE MIDURETHAL SLING;  Surgeon: Nunzio Cobbs, MD;  Location: [place];  Service: Gynecology;  Laterality: N/A;   CERVICAL BIOPSY  W/ LOOP ELECTRODE EXCISION     CESAREAN SECTION  2007 & 2010   CYSTOSCOPY N/A 04/08/2020   Procedure: CYSTOSCOPY;  Surgeon: Nunzio Cobbs, MD;  Location[facility];  Service: Gynecology;  Laterality: N/A;   KNEE ARTHROPLASTY Right [place] N/A 04/08/2020   Procedure: POSTERIOR REPAIR (RECTOCELE) WITH NATIVE TISSUE REPAIR;  Surgeon: Nunzio Cobbs, MD;  [facility];  Service: Gynecology;  Laterality: N/A;   SINUS ENDO WITH FUSION     TOTAL LAPAROSCOPIC HYSTERECTOMY WITH SALPINGECTOMY N/A 04/08/2020   Procedure: TOTAL LAPAROSCOPIC HYSTERECTOMY WITH BILATERAL SALPINGECTOMY;  Surgeon: Megan Salon, MD;  Location: [facility];  Service: Gynecology;  Laterality: N/A;   TUBAL LIGATION  2010   VULVA /PERINEUM BIOPSY  01/2015    MEDS:   Current Outpatient Medications on File Prior to Visit  Medication Sig Dispense Refill   amphetamine-dextroamphetamine (ADDERALL) 15 MG tablet Take 1 tablet by mouth daily before breakfast. 30 tablet 0   amphetamine-dextroamphetamine (ADDERALL) 15 MG tablet Take 1 tablet by  mouth daily before breakfast. 30 tablet 0   B Complex Vitamins (B COMPLEX 50) TABS Take 1 tablet by mouth daily. 30 tablet 11   estradiol (VIVELLE-DOT) 0.025 MG/24HR Place 1 patch onto the skin 2 (two) times a week. 8 patch 3   ferrous sulfate 325 (65 FE) MG tablet Take 325 mg by mouth daily with breakfast.     Multiple Vitamin (MULTIVITAMIN WITH MINERALS) TABS tablet Take 1 tablet by mouth daily.     Omega-3 Fatty Acids (FISH OIL) 300 MG CAPS Take 1 capsule (300 mg total) by mouth daily. 30 capsule 11   sulfamethoxazole-trimethoprim (BACTRIM DS) 800-160 MG tablet Take 1 tablet by mouth 2 (two) times daily. 14 tablet 0   tirzepatide (MOUNJARO) 2.5 MG/0.5ML Pen Inject 2.5 mg into the skin once a week. 6 mL 0   Ubrogepant (UBRELVY) 100 MG TABS Take 100 mg by mouth daily as needed (migraine). 30 tablet 0   Vitamin D, Ergocalciferol, (DRISDOL) 1.25 MG (50000 UNIT) CAPS capsule Take 1 capsule (50,000 Units total) by mouth every 7 (seven) days. 12 capsule 1   No current facility-administered medications on file prior to visit.    ALLERGIES: Patient has no known allergies.  Family History  Problem Relation Age of Onset   Migraines Mother    Hyperlipidemia Mother    Hyperlipidemia Father    Cancer Father        throat   Diabetes Maternal Grandmother    Heart disease Maternal Grandmother    Emphysema Maternal Grandfather    Healthy Paternal Grandmother    Emphysema Paternal Grandfather    Healthy Son    Healthy Son    Breast cancer Maternal Aunt 76       stage 1   Esophageal cancer Maternal Uncle    Rectal cancer Maternal Uncle     SH:  married, non smoker  Review of Systems  Constitutional: Negative.   Genitourinary: Negative.    PHYSICAL EXAMINATION:    BP 102/60 (BP Location: Right Arm, Patient Position: Sitting, Cuff Size: Normal)    Pulse 78    Ht 5\' 2"  (1.575 m) Comment: reported   Wt 148 lb 6.4 oz (67.3 kg)    LMP 03/20/2020    BMI 27.14 kg/m     General appearance: alert,  cooperative and appears stated age Lymph:  no inguinal LAD noted  Pelvic: External genitalia:  no lesions              Urethra:  normal appearing urethra with no masses, tenderness or lesions              Bartholins and Skenes: normal                 Vagina: normal appearing vagina with normal color and discharge, no lesions              Cervix: absent              Bimanual Exam:  Uterus:  normal size, contour, position, consistency, mobility,  non-tender              Pt was examined while standing and first degree cystocele noted.  Pt able to find this bulge while standing and show me exactly what she is feeling.  Assessment/Plan: 1. Female cystocele - could proceed with pelvic PT at any time or if this becomes more prominent.  She does not need any surgical correction for this at this time.  Could also consider a small amount of vaginal estrogen cream or other moisturizer.  As she is just restarting HRT, do not feel should also start vaginal estrogen at this time.  Will monitor symptoms after being on HRT for a month or two.

## 2021-11-26 ENCOUNTER — Encounter (INDEPENDENT_AMBULATORY_CARE_PROVIDER_SITE_OTHER): Payer: Self-pay | Admitting: Family Medicine

## 2021-11-26 ENCOUNTER — Ambulatory Visit (INDEPENDENT_AMBULATORY_CARE_PROVIDER_SITE_OTHER): Payer: BC Managed Care – PPO | Admitting: Family Medicine

## 2021-11-26 ENCOUNTER — Other Ambulatory Visit (INDEPENDENT_AMBULATORY_CARE_PROVIDER_SITE_OTHER): Payer: Self-pay | Admitting: Family Medicine

## 2021-11-26 ENCOUNTER — Other Ambulatory Visit: Payer: Self-pay

## 2021-11-26 VITALS — BP 107/68 | HR 85 | Temp 98.3°F | Ht 62.0 in | Wt 145.0 lb

## 2021-11-26 DIAGNOSIS — N951 Menopausal and female climacteric states: Secondary | ICD-10-CM

## 2021-11-26 DIAGNOSIS — R7301 Impaired fasting glucose: Secondary | ICD-10-CM | POA: Diagnosis not present

## 2021-11-26 DIAGNOSIS — G43809 Other migraine, not intractable, without status migrainosus: Secondary | ICD-10-CM

## 2021-11-26 DIAGNOSIS — G43009 Migraine without aura, not intractable, without status migrainosus: Secondary | ICD-10-CM

## 2021-11-26 DIAGNOSIS — Z6830 Body mass index (BMI) 30.0-30.9, adult: Secondary | ICD-10-CM

## 2021-11-26 DIAGNOSIS — E669 Obesity, unspecified: Secondary | ICD-10-CM | POA: Diagnosis not present

## 2021-11-26 MED ORDER — TIRZEPATIDE 2.5 MG/0.5ML ~~LOC~~ SOAJ: 2.5000 mg | SUBCUTANEOUS | 0 refills | Status: DC

## 2021-11-26 MED ORDER — UBRELVY 100 MG PO TABS: 100.0000 mg | ORAL_TABLET | Freq: Every day | ORAL | 0 refills | Status: DC | PRN

## 2021-11-26 MED ORDER — GABAPENTIN 100 MG PO CAPS
100.0000 mg | ORAL_CAPSULE | Freq: Every day | ORAL | 0 refills | Status: DC
Start: 2021-11-26 — End: 2022-09-17

## 2021-11-26 NOTE — Telephone Encounter (Signed)
Pt last seen by Dr. Brown.  

## 2021-11-26 NOTE — Telephone Encounter (Signed)
Pt was seen today by Dr Juleen China

## 2021-11-27 NOTE — Progress Notes (Signed)
Chief Complaint:   OBESITY Autumn [name] is here to discuss her progress with her obesity treatment plan along with follow-up of her obesity related diagnoses. See Medical Weight Management Flowsheet for complete bioelectrical impedance results.  Today's visit was #: 18 Starting weight: 187 lbs Starting date: 07/09/2020 Weight change since last visit: +1 lb Total lbs lost to date: 42 lbs Total weight loss percentage to date: -22.46%  Nutrition Plan: Category 2 Plan for 40% of the time.  Activity: Walking for 45-60 minutes 2 times per week.  Anti-obesity medications: Mounjaro 2.5 mg subcutaneously weekly. Reported side effects: None.  Interim History: Autumn [name] says she has been having more vasomotor symptoms.  She is taking her Mounjaro every 10 days.  She has 1 refill left.    Assessment/Plan:   1. Menopausal vasomotor syndrome She says the estrogen patch is helping, but she still struggles to sleep. Offered prn Gabapentin while adjusting medication with GYN.  Plan:  Start gabapentin 100-200 mg at bedtime.  - Start gabapentin (NEURONTIN) 100 MG capsule; Take 1-2 capsules (100-200 mg total) by mouth at bedtime.  Dispense: 60 capsule; Refill: 0  2. Impaired fasting glucose, with polyphagia Controlled. Current treatment: Mounjaro 2.5 mg subcutaneously weekly (taking every 10 days)..    Plan: Continue Mounjaro 2.5 mg weekly.  Will refill today, as per below.  She will continue to focus on protein-rich, low simple carbohydrate foods. We reviewed the importance of hydration, regular exercise for stress reduction, and restorative sleep.  - Refill tirzepatide (MOUNJARO) 2.5 MG/0.5ML Pen; Inject 2.5 mg into the skin once a week.  Dispense: 6 mL; Refill: 0  3. Migraine without aura and without status migrainosus, not intractable Autumn [name] is taking Ubrelvy 100 mg daily as needed.   Plan:  Continue Ubrelvy 100 mg daily.  We will refill today, as per below.  - Refill Ubrogepant (UBRELVY) 100 MG  TABS; Take 100 mg by mouth daily as needed (migraine).  Dispense: 30 tablet; Refill: 0  4. Obesity, current BMI 26.6  Course: Autumn [name] is currently in the action stage of change. As such, her goal is to continue with weight loss efforts.   Nutrition goals: She has agreed to the Category 2 Plan.   Exercise goals:  As is.  Behavioral modification strategies: increasing lean protein intake, decreasing simple carbohydrates, increasing vegetables, increasing water intake, decreasing liquid calories, and decreasing alcohol intake.  Autumn [name] has agreed to follow-up with our clinic in 4 weeks. She was informed of the importance of frequent follow-up visits to maximize her success with intensive lifestyle modifications for her multiple health conditions.   Objective:   Blood pressure 107/68, pulse 85, temperature 98.3 F (36.8 C), temperature source Oral, height 5\' 2"  (1.575 m), weight 145 lb (65.8 kg), last menstrual period 03/20/2020, SpO2 98 %. Body mass index is 26.52 kg/m.  General: Cooperative, alert, well developed, in no acute distress. HEENT: Conjunctivae and lids unremarkable. Cardiovascular: Regular rhythm.  Lungs: Normal work of breathing. Neurologic: No focal deficits.   Lab Results  Component Value Date   CREATININE 0.8 07/26/2021   BUN 13 07/26/2021   NA 138 07/26/2021   K 4.3 07/26/2021   CL 102 07/26/2021   CO2 25 (A) 07/26/2021   Lab Results  Component Value Date   ALT 10 07/26/2021   AST 13 07/26/2021   ALKPHOS 57 07/26/2021   BILITOT 0.4 11/11/2020   Lab Results  Component Value Date   TSH 3.61 07/26/2021   Lab Results  Component Value Date   CHOL 187 07/26/2021   HDL 55 07/26/2021   LDLCALC 119 07/26/2021   TRIG 67 07/26/2021   Lab Results  Component Value Date   VD25OH 38.0 09/29/2021   VD25OH 32.9 11/11/2020   VD25OH 30.64 08/24/2017   Lab Results  Component Value Date   WBC 6.9 09/24/2021   HGB 13.0 09/24/2021   HCT 38.6 09/24/2021   MCV 88  09/24/2021   PLT 223 09/24/2021   Lab Results  Component Value Date   IRON 51 09/24/2021   TIBC 380 09/24/2021   FERRITIN 20 09/24/2021   Attestation Statements:   Reviewed by clinician on day of visit: allergies, medications, problem list, medical history, surgical history, family history, social history, and previous encounter notes.  I, Water quality scientist, CMA, am acting as transcriptionist for Briscoe Deutscher, DO  I have reviewed the above documentation for accuracy and completeness, and I agree with the above. -  Briscoe Deutscher, DO, MS, FAAFP, DABOM - Family and Bariatric Medicine.

## 2021-12-09 ENCOUNTER — Encounter (HOSPITAL_BASED_OUTPATIENT_CLINIC_OR_DEPARTMENT_OTHER): Payer: Self-pay | Admitting: Obstetrics & Gynecology

## 2021-12-17 ENCOUNTER — Encounter (HOSPITAL_BASED_OUTPATIENT_CLINIC_OR_DEPARTMENT_OTHER): Payer: Self-pay | Admitting: Obstetrics & Gynecology

## 2021-12-17 ENCOUNTER — Ambulatory Visit (INDEPENDENT_AMBULATORY_CARE_PROVIDER_SITE_OTHER): Payer: BC Managed Care – PPO | Admitting: Obstetrics & Gynecology

## 2021-12-17 ENCOUNTER — Other Ambulatory Visit: Payer: Self-pay

## 2021-12-17 VITALS — BP 112/72 | HR 75 | Ht 62.0 in | Wt 151.8 lb

## 2021-12-17 DIAGNOSIS — R232 Flushing: Secondary | ICD-10-CM | POA: Diagnosis not present

## 2021-12-17 DIAGNOSIS — N750 Cyst of Bartholin's gland: Secondary | ICD-10-CM | POA: Diagnosis not present

## 2021-12-17 DIAGNOSIS — D6851 Activated protein C resistance: Secondary | ICD-10-CM | POA: Diagnosis not present

## 2021-12-17 MED ORDER — SULFAMETHOXAZOLE-TRIMETHOPRIM 800-160 MG PO TABS: 1.0000 | ORAL_TABLET | Freq: Two times a day (BID) | ORAL | 0 refills | Status: DC

## 2021-12-17 MED ORDER — ESTRADIOL 0.0375 MG/24HR TD PTTW: 1.0000 | MEDICATED_PATCH | TRANSDERMAL | 2 refills | Status: DC

## 2021-12-17 NOTE — Progress Notes (Signed)
GYNECOLOGY  VISIT  CC:   recheck vulvar bump  HPI: 46 y.o. G80P2002 Married White or Caucasian female here for recheck of vulvar bump.  This is the third visit for this.  First visit, there was a small bartholin's cyst that has resolved by the time the patient returned.  She still felt she was feeling a bulge and the only finding was a small cystocele that did not require any treatment. Now she feels the bulge is larger and does not move so she is not sure this is a cystocele.  Reports there is some tenderness.  Denies fever, bleeding or discharge.  Pt also started HRT for hot flashes.  Does not feel this has made much difference.  Has been on one month.  Started on 0.025mg  patch dosing.  Discussed increasing to the next dosage.  Also, did try gabapentin and did not like the way she felt on it.  Tried the 100mg  dosage and only at night.  Does not feel like that will be an option for her.  GYNECOLOGIC HISTORY: Patient's last menstrual period was 03/20/2020. Contraception: hysterectomy Menopausal hormone therapy: estradiol 0.025mg  patch  Patient Active Problem List   Diagnosis Date Noted   Female cystocele 11/25/2021   Chronic migraine without aura without status migrainosus, not intractable 08/11/2021   History of hysterectomy 04/20/2021   Family history of malignant neoplasm of digestive organ 04/15/2021   Trochanteric bursitis of left hip 01/12/2020   Patellofemoral arthritis of right knee 05/11/2018   Lumbar radiculopathy 03/22/2018   Nonallopathic lesion of thoracic region 10/07/2017   Nonallopathic lesion of cervical region 10/07/2017   Nonallopathic lesion of lumbosacral region 10/07/2017   Piriformis syndrome of right side 08/24/2017   Polyarthropathy or polyarthritis of multiple sites 08/24/2017   Injury of digital nerve of left ring finger 08/02/2017   Mild episode of recurrent major depressive disorder ([place]) 02/16/2017   Attention deficit hyperactivity disorder (ADHD) 02/16/2017    Psoriasis 02/16/2017   Heterozygous factor V Leiden mutation ([place]) 09/01/2016   Nephrolithiasis 06/07/2012   Headache, menstrual migraine 06/07/2012   Malignant melanoma ([place]) 06/07/2012    Past Medical History:  Diagnosis Date   Anemia    Anxiety    Attention deficit hyperactivity disorder (ADHD) 02/16/2017   Sees [facility] for managment   Back pain    Calculus of kidney 06/07/2012   Overview:  Dr Karsten Ro - urology    Cancer [facility])    melanoma removed from abdomen in 6578   Complication of anesthesia    low BP during C-section   Depression    Factor V Leiden carrier ([place])    heterozygous   Hernia, rectovaginal 06/07/2012   Overview:  Dr Corinna Capra - gyn    History of abnormal cervical Pap smear 1997   dysplasia- LEEP   History of reconstructive repair of rectocele    IBS (irritable bowel syndrome)    Joint pain    Lower extremity edema    Migraine    Ovarian cyst 1994   Psoriasis 02/16/2017   -sees dermatologist   Psoriatic arthritis [facility])     Past Surgical History:  Procedure Laterality Date   BLADDER SUSPENSION N/A 04/08/2020   Procedure: POSSIBLE TRANSVAGINAL TAPE (TVT) PROCEDURE MIDURETHAL SLING;  Surgeon: Nunzio Cobbs, MD;  Location: [place];  Service: Gynecology;  Laterality: N/A;   CERVICAL BIOPSY  W/ LOOP ELECTRODE EXCISION     CESAREAN SECTION  2007 & 2010   CYSTOSCOPY  N/A 04/08/2020   Procedure: CYSTOSCOPY;  Surgeon: Nunzio Cobbs, MD;  Location: [facility];  Service: Gynecology;  Laterality: N/A;   KNEE ARTHROPLASTY Right [place] N/A 04/08/2020   Procedure: POSTERIOR REPAIR (RECTOCELE) WITH NATIVE TISSUE REPAIR;  Surgeon: Nunzio Cobbs, MD;  Location: [facility];  Service: Gynecology;  Laterality: N/A;   SINUS ENDO WITH FUSION     TOTAL LAPAROSCOPIC HYSTERECTOMY WITH SALPINGECTOMY N/A 04/08/2020   Procedure: TOTAL  LAPAROSCOPIC HYSTERECTOMY WITH BILATERAL SALPINGECTOMY;  Surgeon: Megan Salon, MD;  Location: [facility];  Service: Gynecology;  Laterality: N/A;   TUBAL LIGATION  2010   VULVA /PERINEUM BIOPSY  01/2015    MEDS:   Current Outpatient Medications on File Prior to Visit  Medication Sig Dispense Refill   B Complex Vitamins (B COMPLEX 50) TABS Take 1 tablet by mouth daily. 30 tablet 11   ferrous sulfate 325 (65 FE) MG tablet Take 325 mg by mouth daily with breakfast.     gabapentin (NEURONTIN) 100 MG capsule Take 1-2 capsules (100-200 mg total) by mouth at bedtime. 60 capsule 0   Multiple Vitamin (MULTIVITAMIN WITH MINERALS) TABS tablet Take 1 tablet by mouth daily.     Omega-3 Fatty Acids (FISH OIL) 300 MG CAPS Take 1 capsule (300 mg total) by mouth daily. 30 capsule 11   tirzepatide (MOUNJARO) 2.5 MG/0.5ML Pen Inject 2.5 mg into the skin once a week. 6 mL 0   Ubrogepant (UBRELVY) 100 MG TABS Take 100 mg by mouth daily as needed (migraine). 30 tablet 0   Vitamin D, Ergocalciferol, (DRISDOL) 1.25 MG (50000 UNIT) CAPS capsule Take 1 capsule (50,000 Units total) by mouth every 7 (seven) days. 12 capsule 1   amphetamine-dextroamphetamine (ADDERALL) 15 MG tablet Take 1 tablet by mouth daily before breakfast. 30 tablet 0   No current facility-administered medications on file prior to visit.    ALLERGIES: Patient has no known allergies.  Family History  Problem Relation Age of Onset   Migraines Mother    Hyperlipidemia Mother    Hyperlipidemia Father    Cancer Father        throat   Diabetes Maternal Grandmother    Heart disease Maternal Grandmother    Emphysema Maternal Grandfather    Healthy Paternal Grandmother    Emphysema Paternal Grandfather    Healthy Son    Healthy Son    Breast cancer Maternal Aunt 76       stage 1   Esophageal cancer Maternal Uncle    Rectal cancer Maternal Uncle     SH:  married, non smoker  Review of Systems  All other systems reviewed  and are negative.  PHYSICAL EXAMINATION:    BP 112/72 (BP Location: Left Arm, Patient Position: Sitting, Cuff Size: Normal)    Pulse 75    Ht 5\' 2"  (1.575 m) Comment: reported   Wt 151 lb 12.8 oz (68.9 kg)    LMP 03/20/2020    BMI 27.76 kg/m     General appearance: alert, cooperative and appears stated age Lymph:  no inguinal LAD noted  Pelvic: External genitalia:  left enlarged 3cm Bartholin's gland cyst, possible abscess, mild tenderness present              Urethra:  normal appearing urethra with no masses, tenderness or lesions  Bartholins and Skenes: normal                 Vagina: normal appearing vagina with normal color and discharge, no lesions               Chaperone, Octaviano Batty, CMA, was present for exam.  Assessment/Plan: 1. Bartholin gland cyst - I&D offered today with placement of Word catheter.  Pt prefers to try antibiotics.  Recheck 1 week. - sulfamethoxazole-trimethoprim (BACTRIM DS) 800-160 MG tablet; Take 1 tablet by mouth 2 (two) times daily.  Dispense: 14 tablet; Refill: 0  2. Hot flashes - will increase HRT dosage and recheck 1 month - estradiol (VIVELLE-DOT) 0.0375 MG/24HR; Place 1 patch onto the skin 2 (two) times a week.  Dispense: 8 patch; Refill: 2  3. Heterozygous factor V Leiden mutation [facility]) - has seen hematology and was okayed to try HRT

## 2021-12-18 DIAGNOSIS — N750 Cyst of Bartholin's gland: Secondary | ICD-10-CM | POA: Insufficient documentation

## 2021-12-30 ENCOUNTER — Other Ambulatory Visit: Payer: Self-pay

## 2021-12-30 ENCOUNTER — Ambulatory Visit (INDEPENDENT_AMBULATORY_CARE_PROVIDER_SITE_OTHER): Payer: BC Managed Care – PPO | Admitting: Obstetrics & Gynecology

## 2021-12-30 VITALS — BP 128/72 | HR 76 | Resp 14

## 2021-12-30 DIAGNOSIS — N750 Cyst of Bartholin's gland: Secondary | ICD-10-CM

## 2021-12-30 DIAGNOSIS — N9089 Other specified noninflammatory disorders of vulva and perineum: Secondary | ICD-10-CM | POA: Diagnosis not present

## 2022-01-01 ENCOUNTER — Encounter (HOSPITAL_BASED_OUTPATIENT_CLINIC_OR_DEPARTMENT_OTHER): Payer: Self-pay | Admitting: Obstetrics & Gynecology

## 2022-01-01 NOTE — Progress Notes (Signed)
GYNECOLOGY  VISIT  CC:   treatment of bartholin's cyst  HPI: 46 y.o. G46P2002 Married White or Caucasian female here for discussion of treatment of enlarged Bartholin's gland cyst.  This is enlarged a little more.  It is mildly tender.  Pt is not sure what she wants to do for treatment.  She has been on antibiotics and this has not helped.    GYNECOLOGIC HISTORY: Patient's last menstrual period was 03/20/2020. Contraception: Hysterectomy  Patient Active Problem List   Diagnosis Date Noted   Bartholin gland cyst 12/18/2021   Female cystocele 11/25/2021   Chronic migraine without aura without status migrainosus, not intractable 08/11/2021   History of hysterectomy 04/20/2021   Family history of malignant neoplasm of digestive organ 04/15/2021   Trochanteric bursitis of left hip 01/12/2020   Patellofemoral arthritis of right knee 05/11/2018   Lumbar radiculopathy 03/22/2018   Nonallopathic lesion of thoracic region 10/07/2017   Nonallopathic lesion of cervical region 10/07/2017   Nonallopathic lesion of lumbosacral region 10/07/2017   Piriformis syndrome of right side 08/24/2017   Polyarthropathy or polyarthritis of multiple sites 08/24/2017   Injury of digital nerve of left ring finger 08/02/2017   Mild episode of recurrent major depressive disorder ([place]) 02/16/2017   Attention deficit hyperactivity disorder (ADHD) 02/16/2017   Psoriasis 02/16/2017   Heterozygous factor V Leiden mutation ([place]) 09/01/2016   Nephrolithiasis 06/07/2012   Headache, menstrual migraine 06/07/2012   Malignant melanoma ([place]) 06/07/2012    Past Medical History:  Diagnosis Date   Anemia    Anxiety    Attention deficit hyperactivity disorder (ADHD) 02/16/2017   Sees [facility] for managment   Back pain    Calculus of kidney 06/07/2012   Overview:  Dr Karsten Ro - urology    Cancer [facility])    melanoma removed from abdomen in 1610   Complication of anesthesia    low BP during C-section    Depression    Factor V Leiden carrier ([place])    heterozygous   Hernia, rectovaginal 06/07/2012   Overview:  Dr Corinna Capra - gyn    History of abnormal cervical Pap smear 1997   dysplasia- LEEP   History of reconstructive repair of rectocele    IBS (irritable bowel syndrome)    Joint pain    Lower extremity edema    Migraine    Ovarian cyst 1994   Psoriasis 02/16/2017   -sees dermatologist   Psoriatic arthritis [facility])     Past Surgical History:  Procedure Laterality Date   BLADDER SUSPENSION N/A 04/08/2020   Procedure: POSSIBLE TRANSVAGINAL TAPE (TVT) PROCEDURE MIDURETHAL SLING;  Surgeon: Nunzio Cobbs, MD;  Location: [place];  Service: Gynecology;  Laterality: N/A;   CERVICAL BIOPSY  W/ LOOP ELECTRODE EXCISION     CESAREAN SECTION  2007 & 2010   CYSTOSCOPY N/A 04/08/2020   Procedure: CYSTOSCOPY;  Surgeon: Nunzio Cobbs, MD;  Location: [facility];  Service: Gynecology;  Laterality: N/A;   KNEE ARTHROPLASTY Right [place] N/A 04/08/2020   Procedure: POSTERIOR REPAIR (RECTOCELE) WITH NATIVE TISSUE REPAIR;  Surgeon: Nunzio Cobbs, MD;  Location: [facility];  Service: Gynecology;  Laterality: N/A;   SINUS ENDO WITH FUSION     TOTAL LAPAROSCOPIC HYSTERECTOMY WITH SALPINGECTOMY N/A 04/08/2020   Procedure: TOTAL LAPAROSCOPIC HYSTERECTOMY WITH BILATERAL SALPINGECTOMY;  Surgeon: Megan Salon, MD;  Location: [facility];  Service: Gynecology;  Laterality: N/A;   TUBAL LIGATION  2010   VULVA /PERINEUM BIOPSY  01/2015    MEDS:   Current Outpatient Medications on File Prior to Visit  Medication Sig Dispense Refill   amphetamine-dextroamphetamine (ADDERALL) 15 MG tablet Take 1 tablet by mouth daily before breakfast. 30 tablet 0   B Complex Vitamins (B COMPLEX 50) TABS Take 1 tablet by mouth daily. 30 tablet 11   estradiol (VIVELLE-DOT) 0.0375 MG/24HR Place 1 patch  onto the skin 2 (two) times a week. 8 patch 2   ferrous sulfate 325 (65 FE) MG tablet Take 325 mg by mouth daily with breakfast.     gabapentin (NEURONTIN) 100 MG capsule Take 1-2 capsules (100-200 mg total) by mouth at bedtime. 60 capsule 0   Multiple Vitamin (MULTIVITAMIN WITH MINERALS) TABS tablet Take 1 tablet by mouth daily.     Omega-3 Fatty Acids (FISH OIL) 300 MG CAPS Take 1 capsule (300 mg total) by mouth daily. 30 capsule 11   sulfamethoxazole-trimethoprim (BACTRIM DS) 800-160 MG tablet Take 1 tablet by mouth 2 (two) times daily. 14 tablet 0   tirzepatide (MOUNJARO) 2.5 MG/0.5ML Pen Inject 2.5 mg into the skin once a week. 6 mL 0   Ubrogepant (UBRELVY) 100 MG TABS Take 100 mg by mouth daily as needed (migraine). 30 tablet 0   Vitamin D, Ergocalciferol, (DRISDOL) 1.25 MG (50000 UNIT) CAPS capsule Take 1 capsule (50,000 Units total) by mouth every 7 (seven) days. 12 capsule 1   No current facility-administered medications on file prior to visit.    ALLERGIES: Patient has no known allergies.  Family History  Problem Relation Age of Onset   Migraines Mother    Hyperlipidemia Mother    Hyperlipidemia Father    Cancer Father        throat   Diabetes Maternal Grandmother    Heart disease Maternal Grandmother    Emphysema Maternal Grandfather    Healthy Paternal Grandmother    Emphysema Paternal Grandfather    Healthy Son    Healthy Son    Breast cancer Maternal Aunt 76       stage 1   Esophageal cancer Maternal Uncle    Rectal cancer Maternal Uncle     SH:  married, non smoker  Review of Systems  Genitourinary:        Vulvar mass   PHYSICAL EXAMINATION:    BP 128/72    Pulse 76    Resp 14    LMP 03/20/2020     General appearance: alert, cooperative and appears stated age Lymph:  no inguinal LAD noted  Pelvic: External genitalia:  no lesions              Urethra:  normal appearing urethra with no masses, tenderness or lesions              Bartholins and Skenes:  normal                 Vagina: normal appearing vagina with normal color and discharge, enlarged and mildly tender left Bartholin's cyst.  Larger in size than prior visit, about 4.5cm               Procedure:  Consent obtained for I&D of Bartholin's cyst.  Area cleansed with Betadine x 3.  1.5cc 1% Lidocaine instilled.  Using #11 blade, lesion opened.  Clot and old blood was present.  No odor.  No evidence of infection.  Lesion was  milked to try and remove all clot.  Word catheter placed without difficulty.  Pt tolerated procedure well.  Chaperone, Octaviano Batty, CMA, was present for exam.  Assessment/Plan: 1. Cyst of left Bartholin's gland - will plan to remove catheter in 1 - 2 weeks if pt tolerates it - pt will finish bactrim DS BID prescription  2. Vulvar hematoma

## 2022-01-06 ENCOUNTER — Other Ambulatory Visit: Payer: Self-pay

## 2022-01-06 ENCOUNTER — Ambulatory Visit (INDEPENDENT_AMBULATORY_CARE_PROVIDER_SITE_OTHER): Payer: BC Managed Care – PPO | Admitting: Obstetrics & Gynecology

## 2022-01-06 ENCOUNTER — Encounter (HOSPITAL_BASED_OUTPATIENT_CLINIC_OR_DEPARTMENT_OTHER): Payer: Self-pay | Admitting: Obstetrics & Gynecology

## 2022-01-06 VITALS — BP 106/72 | HR 67 | Wt 152.8 lb

## 2022-01-06 DIAGNOSIS — N750 Cyst of Bartholin's gland: Secondary | ICD-10-CM

## 2022-01-06 DIAGNOSIS — N9089 Other specified noninflammatory disorders of vulva and perineum: Secondary | ICD-10-CM

## 2022-01-06 NOTE — Progress Notes (Signed)
GYNECOLOGY  VISIT  CC:   recheck after I&D of vulvar   HPI: 46 y.o. G41P2002 Married White or Caucasian female here for recheck after I&D of hematoma of left bartholin's cyst.  There is still a small lesion present.  Having mild tenderness.  Denies any drainage or bleeding.  Word catheter fell out less than 24 hours after placement.  Has many questions that were answered specifically regarding cause, recurrence, possible future treatment options.. Review of Systems  All other systems reviewed and are negative.  PHYSICAL EXAMINATION:    BP 106/72 (BP Location: Left Arm, Patient Position: Sitting, Cuff Size: Normal)    Pulse 67    Wt 152 lb 12.8 oz (69.3 kg)    LMP 03/20/2020    BMI 27.95 kg/m     General appearance: alert, cooperative and appears stated age Lymph:  no inguinal LAD noted  Pelvic: External genitalia:  no lesions              Urethra:  normal appearing urethra with no masses, tenderness or lesions              Bartholins and Skenes: normal                 Vagina: normal appearing vagina with normal color and discharge, 2.0cm left bartholin's cyst, smooth and round, mildly tender to palpation              Assessment/Plan: 1. Cyst of left Bartholin's gland - for now, will monitor conservatively and watch to see if there are any changes.  Pt would likely consider replacement of Word catheter if this recurs.    2. Vulvar hematoma

## 2022-01-14 ENCOUNTER — Encounter (INDEPENDENT_AMBULATORY_CARE_PROVIDER_SITE_OTHER): Payer: Self-pay | Admitting: Family Medicine

## 2022-01-14 ENCOUNTER — Ambulatory Visit (INDEPENDENT_AMBULATORY_CARE_PROVIDER_SITE_OTHER): Payer: BC Managed Care – PPO | Admitting: Family Medicine

## 2022-01-14 ENCOUNTER — Other Ambulatory Visit: Payer: Self-pay

## 2022-01-14 VITALS — BP 95/62 | HR 76 | Temp 97.9°F | Ht 62.0 in | Wt 149.0 lb

## 2022-01-14 DIAGNOSIS — F908 Attention-deficit hyperactivity disorder, other type: Secondary | ICD-10-CM | POA: Diagnosis not present

## 2022-01-14 DIAGNOSIS — Z6830 Body mass index (BMI) 30.0-30.9, adult: Secondary | ICD-10-CM

## 2022-01-14 DIAGNOSIS — E669 Obesity, unspecified: Secondary | ICD-10-CM | POA: Diagnosis not present

## 2022-01-14 DIAGNOSIS — R232 Flushing: Secondary | ICD-10-CM

## 2022-01-14 DIAGNOSIS — R7301 Impaired fasting glucose: Secondary | ICD-10-CM | POA: Diagnosis not present

## 2022-01-14 DIAGNOSIS — Z6827 Body mass index (BMI) 27.0-27.9, adult: Secondary | ICD-10-CM

## 2022-01-14 MED ORDER — TIRZEPATIDE 5 MG/0.5ML ~~LOC~~ SOAJ: 5.0000 mg | SUBCUTANEOUS | 2 refills | Status: DC

## 2022-01-15 ENCOUNTER — Encounter (INDEPENDENT_AMBULATORY_CARE_PROVIDER_SITE_OTHER): Payer: Self-pay | Admitting: Family Medicine

## 2022-01-15 DIAGNOSIS — F908 Attention-deficit hyperactivity disorder, other type: Secondary | ICD-10-CM

## 2022-01-15 NOTE — Progress Notes (Signed)
Chief Complaint:   OBESITY Autumn [name] is here to discuss her progress with her obesity treatment plan along with follow-up of her obesity related diagnoses. See Medical Weight Management Flowsheet for complete bioelectrical impedance results.  Today's visit was #: 42 Starting weight: 187 lbs Starting date: 07/09/2020 Weight change since last visit: +4 lbs Total lbs lost to date: 18 lbs Total weight loss percentage to date: -20.32%  Nutrition Plan: Category 2 Plan for 50% of the time.  Activity: Walking for 45 minutes 3 times per week.  Anti-obesity medications: Mounjaro 2.5 mg subcutaneously weekly. Reported side effects: None.  Interim History: Autumn [name]'s estrogen was increased and she says her hunger levels have increased.  She reports that her hot flashes have much improved.  She is still taking gabapentin as needed and at bedtime.  Her blood pressure is slightly low today.  Assessment/Plan:   1. Impaired fasting glucose Autumn [name] is taking Mounjaro 2.5 mg subcutaneously weekly.  Plan:  Increase Mounjaro to 5 mg subcutaneously weekly.  Okay 2.5 mg if calls.  - Increase tirzepatide (MOUNJARO) 5 MG/0.5ML Pen; Inject 5 mg into the skin once a week.  Dispense: 2 mL; Refill: 2  2. Hot flashes Improving.  Continue gabapentin as needed and at bedtime.  3. Attention deficit hyperactivity disorder (ADHD), other type Consider restarting Vyvanse 20 mg chew.   4. Obesity, current BMI 27.3  Course: Autumn [name] is currently in the action stage of change. As such, her goal is to continue with weight loss efforts.   Nutrition goals: She has agreed to the Category 2 Plan.   Exercise goals:  As is.  Behavioral modification strategies: increasing lean protein intake, decreasing simple carbohydrates, and increasing vegetables.  Autumn [name] has agreed to follow-up with our clinic in 4 weeks. She was informed of the importance of frequent follow-up visits to maximize her success with intensive lifestyle  modifications for her multiple health conditions.   Objective:   Blood pressure 95/62, pulse 76, temperature 97.9 F (36.6 C), temperature source Oral, height 5\' 2"  (1.575 m), weight 149 lb (67.6 kg), last menstrual period 03/20/2020, SpO2 100 %. Body mass index is 27.25 kg/m.  General: Cooperative, alert, well developed, in no acute distress. HEENT: Conjunctivae and lids unremarkable. Cardiovascular: Regular rhythm.  Lungs: Normal work of breathing. Neurologic: No focal deficits.   Lab Results  Component Value Date   CREATININE 0.8 07/26/2021   BUN 13 07/26/2021   NA 138 07/26/2021   K 4.3 07/26/2021   CL 102 07/26/2021   CO2 25 (A) 07/26/2021   Lab Results  Component Value Date   ALT 10 07/26/2021   AST 13 07/26/2021   ALKPHOS 57 07/26/2021   BILITOT 0.4 11/11/2020   Lab Results  Component Value Date   TSH 3.61 07/26/2021   Lab Results  Component Value Date   CHOL 187 07/26/2021   HDL 55 07/26/2021   LDLCALC 119 07/26/2021   TRIG 67 07/26/2021   Lab Results  Component Value Date   VD25OH 38.0 09/29/2021   VD25OH 32.9 11/11/2020   VD25OH 30.64 08/24/2017   Lab Results  Component Value Date   WBC 6.9 09/24/2021   HGB 13.0 09/24/2021   HCT 38.6 09/24/2021   MCV 88 09/24/2021   PLT 223 09/24/2021   Lab Results  Component Value Date   IRON 51 09/24/2021   TIBC 380 09/24/2021   FERRITIN 20 09/24/2021   Attestation Statements:   Reviewed by clinician on day of visit: allergies,  medications, problem list, medical history, surgical history, family history, social history, and previous encounter notes.  I, Water quality scientist, CMA, am acting as transcriptionist for Briscoe Deutscher, DO  I have reviewed the above documentation for accuracy and completeness, and I agree with the above. -  Briscoe Deutscher, DO, MS, FAAFP, DABOM - Family and Bariatric Medicine.

## 2022-01-15 NOTE — Telephone Encounter (Signed)
LOV w/ [place]

## 2022-01-18 MED ORDER — VYVANSE 20 MG PO CHEW: 20.0000 mg | CHEWABLE_TABLET | ORAL | 0 refills | Status: DC

## 2022-01-19 ENCOUNTER — Other Ambulatory Visit (INDEPENDENT_AMBULATORY_CARE_PROVIDER_SITE_OTHER): Payer: Self-pay

## 2022-01-19 DIAGNOSIS — F908 Attention-deficit hyperactivity disorder, other type: Secondary | ICD-10-CM

## 2022-01-19 MED ORDER — VYVANSE 20 MG PO CHEW: 20.0000 mg | CHEWABLE_TABLET | ORAL | 0 refills | Status: DC

## 2022-02-03 DIAGNOSIS — D225 Melanocytic nevi of trunk: Secondary | ICD-10-CM | POA: Diagnosis not present

## 2022-02-03 DIAGNOSIS — L820 Inflamed seborrheic keratosis: Secondary | ICD-10-CM | POA: Diagnosis not present

## 2022-02-03 DIAGNOSIS — L570 Actinic keratosis: Secondary | ICD-10-CM | POA: Diagnosis not present

## 2022-02-03 DIAGNOSIS — Z85820 Personal history of malignant melanoma of skin: Secondary | ICD-10-CM | POA: Diagnosis not present

## 2022-02-19 ENCOUNTER — Ambulatory Visit (INDEPENDENT_AMBULATORY_CARE_PROVIDER_SITE_OTHER): Payer: BC Managed Care – PPO | Admitting: Family Medicine

## 2022-02-19 ENCOUNTER — Other Ambulatory Visit: Payer: Self-pay

## 2022-02-19 ENCOUNTER — Encounter (INDEPENDENT_AMBULATORY_CARE_PROVIDER_SITE_OTHER): Payer: Self-pay | Admitting: Family Medicine

## 2022-02-19 VITALS — BP 98/62 | HR 71 | Temp 98.0°F | Ht 62.0 in | Wt 144.0 lb

## 2022-02-19 DIAGNOSIS — R5383 Other fatigue: Secondary | ICD-10-CM

## 2022-02-19 DIAGNOSIS — F908 Attention-deficit hyperactivity disorder, other type: Secondary | ICD-10-CM | POA: Diagnosis not present

## 2022-02-19 DIAGNOSIS — Z6826 Body mass index (BMI) 26.0-26.9, adult: Secondary | ICD-10-CM

## 2022-02-19 DIAGNOSIS — E669 Obesity, unspecified: Secondary | ICD-10-CM

## 2022-02-19 DIAGNOSIS — E8881 Metabolic syndrome: Secondary | ICD-10-CM | POA: Diagnosis not present

## 2022-02-20 LAB — COMPREHENSIVE METABOLIC PANEL
ALT: 19 IU/L (ref 0–32)
AST: 20 IU/L (ref 0–40)
Albumin/Globulin Ratio: 1.8 (ref 1.2–2.2)
Albumin: 4.1 g/dL (ref 3.8–4.8)
Alkaline Phosphatase: 76 IU/L (ref 44–121)
BUN/Creatinine Ratio: 19 (ref 9–23)
BUN: 14 mg/dL (ref 6–24)
Bilirubin Total: 0.5 mg/dL (ref 0.0–1.2)
CO2: 25 mmol/L (ref 20–29)
Calcium: 9.3 mg/dL (ref 8.7–10.2)
Chloride: 102 mmol/L (ref 96–106)
Creatinine, Ser: 0.72 mg/dL (ref 0.57–1.00)
Globulin, Total: 2.3 g/dL (ref 1.5–4.5)
Glucose: 74 mg/dL (ref 70–99)
Potassium: 4.5 mmol/L (ref 3.5–5.2)
Sodium: 141 mmol/L (ref 134–144)
Total Protein: 6.4 g/dL (ref 6.0–8.5)
eGFR: 104 mL/min/{1.73_m2} (ref >59)

## 2022-02-20 LAB — CBC WITH DIFFERENTIAL/PLATELET
Basophils Absolute: 0 10*3/uL (ref 0.0–0.2)
Basos: 0 %
EOS (ABSOLUTE): 0.1 10*3/uL (ref 0.0–0.4)
Eos: 1 %
Hemoglobin: 13.5 g/dL (ref 11.1–15.9)
Immature Grans (Abs): 0 10*3/uL (ref 0.0–0.1)
Immature Granulocytes: 0 %
Lymphocytes Absolute: 2.5 10*3/uL (ref 0.7–3.1)
Lymphs: 28 %
MCH: 30.1 pg (ref 26.6–33.0)
MCHC: 32.8 g/dL (ref 31.5–35.7)
MCV: 92 fL (ref 79–97)
Monocytes Absolute: 0.5 10*3/uL (ref 0.1–0.9)
Monocytes: 6 %
Neutrophils Absolute: 5.8 10*3/uL (ref 1.4–7.0)
Neutrophils: 65 %
Platelets: 228 10*3/uL (ref 150–450)
RBC: 4.49 x10E6/uL (ref 3.77–5.28)
RDW: 12.5 % (ref 11.7–15.4)
WBC: 9 10*3/uL (ref 3.4–10.8)

## 2022-02-20 LAB — ANEMIA PANEL
Ferritin: 54 ng/mL (ref 15–150)
Folate, Hemolysate: 413 ng/mL
Folate, RBC: 1002 ng/mL (ref >498)
Hematocrit: 41.2 % (ref 34.0–46.6)
Iron Saturation: 57 % — ABNORMAL HIGH (ref 15–55)
Iron: 122 ug/dL (ref 27–159)
Retic Ct Pct: 0.7 % (ref 0.6–2.6)
Total Iron Binding Capacity: 213 ug/dL — ABNORMAL LOW (ref 250–450)
UIBC: 91 ug/dL — ABNORMAL LOW (ref 131–425)
Vitamin B-12: 567 pg/mL (ref 232–1245)

## 2022-02-20 LAB — TSH: TSH: 2.51 u[IU]/mL (ref 0.450–4.500)

## 2022-02-21 DIAGNOSIS — J019 Acute sinusitis, unspecified: Secondary | ICD-10-CM | POA: Diagnosis not present

## 2022-02-21 DIAGNOSIS — Z299 Encounter for prophylactic measures, unspecified: Secondary | ICD-10-CM | POA: Diagnosis not present

## 2022-02-23 MED ORDER — VYVANSE 20 MG PO CHEW: 20.0000 mg | CHEWABLE_TABLET | ORAL | 0 refills | Status: DC

## 2022-03-03 NOTE — Progress Notes (Signed)
Chief Complaint:   OBESITY Autumn [name] is here to discuss her progress with her obesity treatment plan along with follow-up of her obesity related diagnoses. See Medical Weight Management Flowsheet for complete bioelectrical impedance results.  Today's visit was #: 20 Starting weight: 187 lbs Starting date: 07/09/2020 Weight change since last visit: 5 lbs Total lbs lost to date: 43 lbs Total weight loss percentage to date: -22.99%  Nutrition Plan: Category 2 Plan for 50% of the time.  Activity: Walking for 30 minutes 3-4 times per week. Anti-obesity medications: Mounjaro 5 mg subcutaneously weekly. Reported side effects: None.  Interim History: Autumn [name] has one 5 mg Mounjaro pen.  She has 2.5 mg doses as well.  Nausene helps.  She says she has low energy.  She endorses seasonal allergies.  She is taking Flonase, Claritin.   Assessment/Plan:   1. Other fatigue Autumn [name] endorses more fatigue recently.  Plan:  Will check labs today, as per below.  - Anemia panel - CBC with Differential/Platelet - Comprehensive metabolic panel - TSH  2. Insulin resistance with polyphagia Not at goal. Goal is HgbA1c < 5.7, fasting insulin closer to 5.  Medication: Mounjaro 5 mg subcutaneously weekly.    Plan:  Continue Mounjaro 2.5 mg subcutaneously weekly.  She will continue to focus on protein-rich, low simple carbohydrate foods. We reviewed the importance of hydration, regular exercise for stress reduction, and restorative sleep.   3. Attention deficit hyperactivity disorder (ADHD), other type with Binge Eating Disorder Improving. Medication:  Vyvanse 20 mg daily.  Counseling ADHD is the most missed diagnosis in relation to food and appetite problems. Often the strong urge to binge or to self-medicate with food subsides once the impulsivity and inattention of ADHD are treated. A person can experience a new ability to tune in to the body's signals, control cravings and improve impulse control.  A  deficiency in norepinephrine and dopamine can lead to the following behaviors related to eating:  Poor awareness of internal cues of hunger and satiety, or fullness. Inability to follow a meal plan. Inability to judge portion size accurately. Inability to stop bingeing or purging. Distraction by continual thoughts of food, weight and body shape. Increased desire to overeat, especially high calorie, "reward" type foods. Poor self-esteem due to repeated failures of self-control.  Plan: Continue Vyvanse 20 mg daily.  Will refill today.  - Refill Lisdexamfetamine Dimesylate (VYVANSE) 20 MG CHEW; Chew 20 mg by mouth every morning.  Dispense: 30 tablet; Refill: 0  I have consulted the [place] Controlled Substances Registry for this patient, and feel the risk/benefit ratio today is favorable for proceeding with this prescription for a controlled substance. The patient understands monitoring parameters and red flags.   4. Obesity with current BMI of 26.4  Course: Autumn [name] is currently in the action stage of change. As such, her goal is to continue with weight loss efforts.   Nutrition goals: She has agreed to the Category 2 Plan.   Exercise goals:  As is.  Behavioral modification strategies: increasing lean protein intake, decreasing simple carbohydrates, increasing vegetables, and increasing water intake.  Autumn [name] has agreed to follow-up with our clinic in 4 weeks. She was informed of the importance of frequent follow-up visits to maximize her success with intensive lifestyle modifications for her multiple health conditions.   Autumn [name] was informed we would discuss her lab results at her next visit unless there is a critical issue that needs to be addressed sooner. Autumn [name] agreed to keep her next  visit at the agreed upon time to discuss these results.  Objective:   Blood pressure 98/62, pulse 71, temperature 98 F (36.7 C), temperature source Oral, height '5\' 2"'$  (1.575 m), weight 144 lb (65.3 kg), last  menstrual period 03/20/2020, SpO2 99 %. Body mass index is 26.34 kg/m.  General: Cooperative, alert, well developed, in no acute distress. HEENT: Conjunctivae and lids unremarkable. Cardiovascular: Regular rhythm.  Lungs: Normal work of breathing. Neurologic: No focal deficits.   Lab Results  Component Value Date   CREATININE 0.72 02/19/2022   BUN 14 02/19/2022   NA 141 02/19/2022   K 4.5 02/19/2022   CL 102 02/19/2022   CO2 25 02/19/2022   Lab Results  Component Value Date   ALT 19 02/19/2022   AST 20 02/19/2022   ALKPHOS 76 02/19/2022   BILITOT 0.5 02/19/2022   Lab Results  Component Value Date   TSH 2.510 02/19/2022   Lab Results  Component Value Date   CHOL 187 07/26/2021   HDL 55 07/26/2021   LDLCALC 119 07/26/2021   TRIG 67 07/26/2021   Lab Results  Component Value Date   VD25OH 38.0 09/29/2021   VD25OH 32.9 11/11/2020   VD25OH 30.64 08/24/2017   Lab Results  Component Value Date   WBC 9.0 02/19/2022   HGB 13.5 02/19/2022   HCT 41.2 02/19/2022   MCV 92 02/19/2022   PLT 228 02/19/2022   Lab Results  Component Value Date   IRON 122 02/19/2022   TIBC 213 (L) 02/19/2022   FERRITIN 54 02/19/2022   Attestation Statements:   Reviewed by clinician on day of visit: allergies, medications, problem list, medical history, surgical history, family history, social history, and previous encounter notes.  I, Water quality scientist, CMA, am acting as transcriptionist for Briscoe Deutscher, DO  I have reviewed the above documentation for accuracy and completeness, and I agree with the above. -  Briscoe Deutscher, DO, MS, FAAFP, DABOM - Family and Bariatric Medicine.

## 2022-03-04 ENCOUNTER — Ambulatory Visit
Admission: RE | Admit: 2022-03-04 | Discharge: 2022-03-04 | Disposition: A | Discharge status: Home / Self Care | Payer: BC Managed Care – PPO | Source: Ambulatory Visit | Attending: Obstetrics & Gynecology | Admitting: Obstetrics & Gynecology

## 2022-03-04 DIAGNOSIS — N6489 Other specified disorders of breast: Secondary | ICD-10-CM | POA: Diagnosis not present

## 2022-03-04 DIAGNOSIS — R922 Inconclusive mammogram: Secondary | ICD-10-CM | POA: Diagnosis not present

## 2022-03-05 DIAGNOSIS — H04123 Dry eye syndrome of bilateral lacrimal glands: Secondary | ICD-10-CM | POA: Diagnosis not present

## 2022-03-18 ENCOUNTER — Encounter (INDEPENDENT_AMBULATORY_CARE_PROVIDER_SITE_OTHER): Payer: Self-pay | Admitting: Family Medicine

## 2022-03-23 DIAGNOSIS — H16223 Keratoconjunctivitis sicca, not specified as Sjogren's, bilateral: Secondary | ICD-10-CM | POA: Diagnosis not present

## 2022-03-30 ENCOUNTER — Encounter (INDEPENDENT_AMBULATORY_CARE_PROVIDER_SITE_OTHER): Payer: Self-pay

## 2022-04-02 DIAGNOSIS — M2241 Chondromalacia patellae, right knee: Secondary | ICD-10-CM | POA: Diagnosis not present

## 2022-04-02 DIAGNOSIS — M25561 Pain in right knee: Secondary | ICD-10-CM | POA: Diagnosis not present

## 2022-04-08 DIAGNOSIS — M25561 Pain in right knee: Secondary | ICD-10-CM | POA: Diagnosis not present

## 2022-04-16 DIAGNOSIS — Z85820 Personal history of malignant melanoma of skin: Secondary | ICD-10-CM | POA: Diagnosis not present

## 2022-04-16 DIAGNOSIS — D2262 Melanocytic nevi of left upper limb, including shoulder: Secondary | ICD-10-CM | POA: Diagnosis not present

## 2022-04-16 DIAGNOSIS — D225 Melanocytic nevi of trunk: Secondary | ICD-10-CM | POA: Diagnosis not present

## 2022-04-16 DIAGNOSIS — D2272 Melanocytic nevi of left lower limb, including hip: Secondary | ICD-10-CM | POA: Diagnosis not present

## 2022-04-21 DIAGNOSIS — E559 Vitamin D deficiency, unspecified: Secondary | ICD-10-CM | POA: Diagnosis not present

## 2022-04-21 DIAGNOSIS — Z9189 Other specified personal risk factors, not elsewhere classified: Secondary | ICD-10-CM | POA: Diagnosis not present

## 2022-04-21 DIAGNOSIS — Z8639 Personal history of other endocrine, nutritional and metabolic disease: Secondary | ICD-10-CM | POA: Diagnosis not present

## 2022-04-21 DIAGNOSIS — E663 Overweight: Secondary | ICD-10-CM | POA: Diagnosis not present

## 2022-04-21 DIAGNOSIS — R632 Polyphagia: Secondary | ICD-10-CM | POA: Diagnosis not present

## 2022-04-21 DIAGNOSIS — F5081 Binge eating disorder: Secondary | ICD-10-CM | POA: Diagnosis not present

## 2022-04-26 ENCOUNTER — Other Ambulatory Visit (HOSPITAL_BASED_OUTPATIENT_CLINIC_OR_DEPARTMENT_OTHER): Payer: Self-pay | Admitting: Obstetrics & Gynecology

## 2022-04-26 DIAGNOSIS — R232 Flushing: Secondary | ICD-10-CM

## 2022-05-01 DIAGNOSIS — M2392 Unspecified internal derangement of left knee: Secondary | ICD-10-CM | POA: Diagnosis not present

## 2022-05-01 DIAGNOSIS — M25561 Pain in right knee: Secondary | ICD-10-CM | POA: Diagnosis not present

## 2022-05-01 DIAGNOSIS — M25562 Pain in left knee: Secondary | ICD-10-CM | POA: Diagnosis not present

## 2022-05-01 DIAGNOSIS — M2391 Unspecified internal derangement of right knee: Secondary | ICD-10-CM | POA: Diagnosis not present

## 2022-05-13 DIAGNOSIS — M25562 Pain in left knee: Secondary | ICD-10-CM | POA: Diagnosis not present

## 2022-05-13 DIAGNOSIS — M25561 Pain in right knee: Secondary | ICD-10-CM | POA: Diagnosis not present

## 2022-05-20 DIAGNOSIS — M170 Bilateral primary osteoarthritis of knee: Secondary | ICD-10-CM | POA: Diagnosis not present

## 2022-05-27 DIAGNOSIS — E8881 Metabolic syndrome: Secondary | ICD-10-CM | POA: Diagnosis not present

## 2022-05-27 DIAGNOSIS — F5081 Binge eating disorder: Secondary | ICD-10-CM | POA: Diagnosis not present

## 2022-05-27 DIAGNOSIS — E663 Overweight: Secondary | ICD-10-CM | POA: Diagnosis not present

## 2022-05-27 DIAGNOSIS — M25561 Pain in right knee: Secondary | ICD-10-CM | POA: Diagnosis not present

## 2022-07-14 DIAGNOSIS — Z72820 Sleep deprivation: Secondary | ICD-10-CM | POA: Diagnosis not present

## 2022-07-14 DIAGNOSIS — E8881 Metabolic syndrome: Secondary | ICD-10-CM | POA: Diagnosis not present

## 2022-07-14 DIAGNOSIS — F5081 Binge eating disorder: Secondary | ICD-10-CM | POA: Diagnosis not present

## 2022-07-15 ENCOUNTER — Encounter (INDEPENDENT_AMBULATORY_CARE_PROVIDER_SITE_OTHER): Payer: Self-pay

## 2022-08-04 DIAGNOSIS — Z114 Encounter for screening for human immunodeficiency virus [HIV]: Secondary | ICD-10-CM | POA: Diagnosis not present

## 2022-08-04 DIAGNOSIS — Z0000 Encounter for general adult medical examination without abnormal findings: Secondary | ICD-10-CM | POA: Diagnosis not present

## 2022-08-04 DIAGNOSIS — Z13220 Encounter for screening for lipoid disorders: Secondary | ICD-10-CM | POA: Diagnosis not present

## 2022-08-05 DIAGNOSIS — M1711 Unilateral primary osteoarthritis, right knee: Secondary | ICD-10-CM | POA: Diagnosis not present

## 2022-08-06 DIAGNOSIS — Z23 Encounter for immunization: Secondary | ICD-10-CM | POA: Diagnosis not present

## 2022-08-06 DIAGNOSIS — Z0000 Encounter for general adult medical examination without abnormal findings: Secondary | ICD-10-CM | POA: Diagnosis not present

## 2022-08-09 ENCOUNTER — Other Ambulatory Visit (HOSPITAL_BASED_OUTPATIENT_CLINIC_OR_DEPARTMENT_OTHER): Payer: Self-pay | Admitting: Obstetrics & Gynecology

## 2022-08-12 DIAGNOSIS — M1711 Unilateral primary osteoarthritis, right knee: Secondary | ICD-10-CM | POA: Diagnosis not present

## 2022-08-19 DIAGNOSIS — F5081 Binge eating disorder: Secondary | ICD-10-CM | POA: Diagnosis not present

## 2022-08-19 DIAGNOSIS — M1711 Unilateral primary osteoarthritis, right knee: Secondary | ICD-10-CM | POA: Diagnosis not present

## 2022-08-19 DIAGNOSIS — E8881 Metabolic syndrome: Secondary | ICD-10-CM | POA: Diagnosis not present

## 2022-08-26 DIAGNOSIS — H04129 Dry eye syndrome of unspecified lacrimal gland: Secondary | ICD-10-CM | POA: Diagnosis not present

## 2022-08-26 DIAGNOSIS — L4050 Arthropathic psoriasis, unspecified: Secondary | ICD-10-CM | POA: Diagnosis not present

## 2022-08-26 DIAGNOSIS — M25561 Pain in right knee: Secondary | ICD-10-CM | POA: Diagnosis not present

## 2022-08-26 DIAGNOSIS — H5789 Other specified disorders of eye and adnexa: Secondary | ICD-10-CM | POA: Diagnosis not present

## 2022-09-17 ENCOUNTER — Other Ambulatory Visit (HOSPITAL_BASED_OUTPATIENT_CLINIC_OR_DEPARTMENT_OTHER): Payer: Self-pay | Admitting: Obstetrics & Gynecology

## 2022-09-17 ENCOUNTER — Encounter (HOSPITAL_BASED_OUTPATIENT_CLINIC_OR_DEPARTMENT_OTHER): Payer: Self-pay | Admitting: Obstetrics & Gynecology

## 2022-09-17 ENCOUNTER — Ambulatory Visit (INDEPENDENT_AMBULATORY_CARE_PROVIDER_SITE_OTHER): Payer: BC Managed Care – PPO | Admitting: Obstetrics & Gynecology

## 2022-09-17 VITALS — BP 101/70 | HR 79 | Ht 61.5 in | Wt 159.0 lb

## 2022-09-17 DIAGNOSIS — Z79890 Hormone replacement therapy (postmenopausal): Secondary | ICD-10-CM

## 2022-09-17 DIAGNOSIS — Z79899 Other long term (current) drug therapy: Secondary | ICD-10-CM

## 2022-09-17 DIAGNOSIS — D6851 Activated protein C resistance: Secondary | ICD-10-CM | POA: Diagnosis not present

## 2022-09-17 DIAGNOSIS — Z01419 Encounter for gynecological examination (general) (routine) without abnormal findings: Secondary | ICD-10-CM | POA: Diagnosis not present

## 2022-09-17 DIAGNOSIS — Z90710 Acquired absence of both cervix and uterus: Secondary | ICD-10-CM

## 2022-09-17 MED ORDER — LIDOCAINE HCL URETHRAL/MUCOSAL 2 % EX PRSY: PREFILLED_SYRINGE | CUTANEOUS | 0 refills | Status: DC

## 2022-09-17 MED ORDER — VEOZAH 45 MG PO TABS: 1.0000 | ORAL_TABLET | Freq: Every day | ORAL | 2 refills | Status: DC

## 2022-09-17 MED ORDER — LIDOCAINE 5 % EX OINT: TOPICAL_OINTMENT | CUTANEOUS | 0 refills | Status: DC

## 2022-09-17 NOTE — Progress Notes (Signed)
46 y.o. G40P2002 Married White or Caucasian female here for annual exam.  Still having some hot flashes.  Did go up on estrogen patch dosage but had breast tenderness.  Veozah discussed today.  Liver enzymes testing discussed.  Will obtain today.  Is having some pain with intercourse.    Patient's last menstrual period was 03/20/2020.          Sexually active: Yes.    The current method of family planning is status post hysterectomy.    Smoker:  no  Health Maintenance: Pap:  not indicated History of abnormal Pap:  no MMG:  02/2022 Colonoscopy:  Dr. Collene Mares, 2022.  Follow up Screening Labs: does with Dr. Ernie Hew   reports that she has never smoked. She has never used smokeless tobacco. She reports that she does not currently use alcohol after a past usage of about 1.0 standard drink of alcohol per week. She reports that she does not use drugs.  Past Medical History:  Diagnosis Date   Anemia    Anxiety    Attention deficit hyperactivity disorder (ADHD) 02/16/2017   Sees [facility] for managment   Back pain    Calculus of kidney 06/07/2012   Overview:  Dr Karsten Ro - urology    Cancer [facility])    melanoma removed from abdomen in 1062   Complication of anesthesia    low BP during C-section   Depression    Factor V Leiden carrier [facility])    heterozygous   Hernia, rectovaginal 06/07/2012   Overview:  Dr Corinna Capra - gyn    History of abnormal cervical Pap smear 1997   dysplasia- LEEP   History of reconstructive repair of rectocele    IBS (irritable bowel syndrome)    Joint pain    Lower extremity edema    Migraine    Ovarian cyst 1994   Psoriasis 02/16/2017   -sees dermatologist   Psoriatic arthritis [facility])     Past Surgical History:  Procedure Laterality Date   BLADDER SUSPENSION N/A 04/08/2020   Procedure: POSSIBLE TRANSVAGINAL TAPE (TVT) PROCEDURE MIDURETHAL SLING;  Surgeon: Nunzio Cobbs, MD;  Location: [facility];  Service: Gynecology;   Laterality: N/A;   CERVICAL BIOPSY  W/ LOOP ELECTRODE EXCISION     early 20's   CESAREAN SECTION  2007 & 2010   CYSTOSCOPY N/A 04/08/2020   Procedure: CYSTOSCOPY;  Surgeon: Nunzio Cobbs, MD;  Location: [facility];  Service: Gynecology;  Laterality: N/A;   KNEE ARTHROPLASTY Right [place] N/A 04/08/2020   Procedure: POSTERIOR REPAIR (RECTOCELE) WITH NATIVE TISSUE REPAIR;  Surgeon: Nunzio Cobbs, MD;  Location: [facility];  Service: Gynecology;  Laterality: N/A;   SINUS ENDO WITH FUSION     TOTAL LAPAROSCOPIC HYSTERECTOMY WITH SALPINGECTOMY N/A 04/08/2020   Procedure: TOTAL LAPAROSCOPIC HYSTERECTOMY WITH BILATERAL SALPINGECTOMY;  Surgeon: Megan Salon, MD;  Location: [facility];  Service: Gynecology;  Laterality: N/A;   TUBAL LIGATION  2010   VULVA /PERINEUM BIOPSY  01/2015    Current Outpatient Medications  Medication Sig Dispense Refill   DOTTI 0.025 MG/24HR Place 1 patch onto the skin 2 (two) times a week. 8 patch 3   Lisdexamfetamine Dimesylate (VYVANSE) 20 MG CHEW Chew 20 mg by mouth every morning. 30 tablet 0   Multiple Vitamin (MULTIVITAMIN WITH MINERALS) TABS tablet Take 1 tablet by mouth daily.  Ubrogepant (UBRELVY) 100 MG TABS Take 100 mg by mouth daily as needed (migraine). 30 tablet 0   Vitamin D, Ergocalciferol, (DRISDOL) 1.25 MG (50000 UNIT) CAPS capsule Take 1 capsule (50,000 Units total) by mouth every 7 (seven) days. 12 capsule 1   No current facility-administered medications for this visit.    Family History  Problem Relation Age of Onset   Migraines Mother    Hyperlipidemia Mother    Hyperlipidemia Father    Cancer Father        throat   Diabetes Maternal Grandmother    Heart disease Maternal Grandmother    Emphysema Maternal Grandfather    Healthy Paternal Grandmother    Emphysema Paternal Grandfather    Healthy Son    Healthy Son     Breast cancer Maternal Aunt 76       stage 1   Esophageal cancer Maternal Uncle    Rectal cancer Maternal Uncle     ROS: Constitutional: negative Genitourinary:negative  Exam:   BP 101/70   Pulse 79   Ht 5' 1.5" (1.562 m)   Wt 159 lb (72.1 kg)   LMP 03/20/2020   BMI 29.56 kg/m   Height: 5' 1.5" (156.2 cm)  General appearance: alert, cooperative and appears stated age Head: Normocephalic, without obvious abnormality, atraumatic Neck: no adenopathy, supple, symmetrical, trachea midline and thyroid normal to inspection and palpation Lungs: clear to auscultation bilaterally Breasts: normal appearance, no masses or tenderness Heart: regular rate and rhythm Abdomen: soft, non-tender; bowel sounds normal; no masses,  no organomegaly Extremities: extremities normal, atraumatic, no cyanosis or edema Skin: Skin color, texture, turgor normal. No rashes or lesions Lymph nodes: Cervical, supraclavicular, and axillary nodes normal. No abnormal inguinal nodes palpated Neurologic: Grossly normal   Pelvic: External genitalia:  no lesions              Urethra:  normal appearing urethra with no masses, tenderness or lesions              Bartholins and Skenes: normal                 Vagina: normal appearing vagina with normal color and no discharge, no lesions              Cervix: absent              Pap taken: No. Bimanual Exam:  Uterus:  uterus absent              Adnexa: no mass, fullness, tenderness               Rectovaginal: Confirms               Anus:  normal sphincter tone, no lesions  Chaperone, Ezekiel Ina, RN, was present for exam.  Assessment/Plan: 1. Well woman exam with routine gynecological exam - Pap smear not indicated - Mammogram 02/2022 - Colonoscopy 2022 with Dr. Collene Mares - lab work done done with PCP - vaccines reviewed/updated  2. Hormone replacement therapy (HRT) - on estradiol 0.'025mg'$  patches twice weekly - will try Veozah  3. High risk medication use  4.  Heterozygous factor V Leiden mutation ([place])  5. History of hysterectomy

## 2022-09-18 LAB — COMPREHENSIVE METABOLIC PANEL
ALT: 15 IU/L (ref 0–32)
AST: 19 IU/L (ref 0–40)
Albumin/Globulin Ratio: 1.8 (ref 1.2–2.2)
Albumin: 4.4 g/dL (ref 3.9–4.9)
Alkaline Phosphatase: 71 IU/L (ref 44–121)
BUN/Creatinine Ratio: 14 (ref 9–23)
BUN: 11 mg/dL (ref 6–24)
Bilirubin Total: 0.5 mg/dL (ref 0.0–1.2)
CO2: 27 mmol/L (ref 20–29)
Calcium: 9.1 mg/dL (ref 8.7–10.2)
Chloride: 101 mmol/L (ref 96–106)
Creatinine, Ser: 0.8 mg/dL (ref 0.57–1.00)
Globulin, Total: 2.4 g/dL (ref 1.5–4.5)
Glucose: 80 mg/dL (ref 70–99)
Potassium: 3.7 mmol/L (ref 3.5–5.2)
Sodium: 142 mmol/L (ref 134–144)
Total Protein: 6.8 g/dL (ref 6.0–8.5)
eGFR: 91 mL/min/{1.73_m2} (ref >59)

## 2022-09-22 DIAGNOSIS — Z8639 Personal history of other endocrine, nutritional and metabolic disease: Secondary | ICD-10-CM | POA: Diagnosis not present

## 2022-09-22 DIAGNOSIS — G43909 Migraine, unspecified, not intractable, without status migrainosus: Secondary | ICD-10-CM | POA: Diagnosis not present

## 2022-09-22 DIAGNOSIS — F909 Attention-deficit hyperactivity disorder, unspecified type: Secondary | ICD-10-CM | POA: Diagnosis not present

## 2022-09-22 DIAGNOSIS — R232 Flushing: Secondary | ICD-10-CM | POA: Diagnosis not present

## 2022-10-09 DIAGNOSIS — J0100 Acute maxillary sinusitis, unspecified: Secondary | ICD-10-CM | POA: Diagnosis not present

## 2022-10-21 DIAGNOSIS — Z85820 Personal history of malignant melanoma of skin: Secondary | ICD-10-CM | POA: Diagnosis not present

## 2022-10-21 DIAGNOSIS — L400 Psoriasis vulgaris: Secondary | ICD-10-CM | POA: Diagnosis not present

## 2022-10-21 DIAGNOSIS — D224 Melanocytic nevi of scalp and neck: Secondary | ICD-10-CM | POA: Diagnosis not present

## 2022-10-21 DIAGNOSIS — D225 Melanocytic nevi of trunk: Secondary | ICD-10-CM | POA: Diagnosis not present

## 2022-10-28 DIAGNOSIS — H1045 Other chronic allergic conjunctivitis: Secondary | ICD-10-CM | POA: Diagnosis not present

## 2022-11-03 ENCOUNTER — Encounter (HOSPITAL_BASED_OUTPATIENT_CLINIC_OR_DEPARTMENT_OTHER): Payer: Self-pay | Admitting: Obstetrics & Gynecology

## 2022-11-04 DIAGNOSIS — F908 Attention-deficit hyperactivity disorder, other type: Secondary | ICD-10-CM | POA: Diagnosis not present

## 2022-11-04 DIAGNOSIS — E88819 Insulin resistance, unspecified: Secondary | ICD-10-CM | POA: Diagnosis not present

## 2022-11-04 DIAGNOSIS — R632 Polyphagia: Secondary | ICD-10-CM | POA: Diagnosis not present

## 2022-11-04 DIAGNOSIS — Z8639 Personal history of other endocrine, nutritional and metabolic disease: Secondary | ICD-10-CM | POA: Diagnosis not present

## 2022-11-09 DIAGNOSIS — H1045 Other chronic allergic conjunctivitis: Secondary | ICD-10-CM | POA: Diagnosis not present

## 2022-11-23 DIAGNOSIS — H16223 Keratoconjunctivitis sicca, not specified as Sjogren's, bilateral: Secondary | ICD-10-CM | POA: Diagnosis not present

## 2022-11-25 ENCOUNTER — Ambulatory Visit: Payer: BC Managed Care – PPO | Admitting: Neurology

## 2022-12-03 ENCOUNTER — Ambulatory Visit (HOSPITAL_BASED_OUTPATIENT_CLINIC_OR_DEPARTMENT_OTHER): Payer: BC Managed Care – PPO | Admitting: Obstetrics & Gynecology

## 2022-12-09 ENCOUNTER — Other Ambulatory Visit (HOSPITAL_BASED_OUTPATIENT_CLINIC_OR_DEPARTMENT_OTHER): Payer: Self-pay | Admitting: Obstetrics & Gynecology

## 2022-12-09 NOTE — Telephone Encounter (Signed)
LMOVM for pt to call regarding refill request 

## 2022-12-23 DIAGNOSIS — R232 Flushing: Secondary | ICD-10-CM | POA: Diagnosis not present

## 2022-12-23 DIAGNOSIS — M5431 Sciatica, right side: Secondary | ICD-10-CM | POA: Diagnosis not present

## 2022-12-23 DIAGNOSIS — F411 Generalized anxiety disorder: Secondary | ICD-10-CM | POA: Diagnosis not present

## 2022-12-23 DIAGNOSIS — R632 Polyphagia: Secondary | ICD-10-CM | POA: Diagnosis not present

## 2023-01-07 ENCOUNTER — Other Ambulatory Visit: Payer: Self-pay | Admitting: Obstetrics & Gynecology

## 2023-01-07 DIAGNOSIS — Z1231 Encounter for screening mammogram for malignant neoplasm of breast: Secondary | ICD-10-CM

## 2023-01-14 ENCOUNTER — Telehealth (HOSPITAL_BASED_OUTPATIENT_CLINIC_OR_DEPARTMENT_OTHER): Payer: Self-pay | Admitting: *Deleted

## 2023-01-14 NOTE — Telephone Encounter (Signed)
Pt called to provide update on menopause symptoms after cutting the patch in half.  Pt reports that after doing so her hot flashes became worse immediately, mainly at night. She decided to try the veozah and it did change the hot flashes. The amount of hot flashes she has at night has decreased with the veozah but the intensity has increased. She would like to know what other recommendations there are for treating her symptoms. Advised that I would send provider her concerns and get back to her with recommendations.

## 2023-01-15 NOTE — Telephone Encounter (Signed)
Called pt to let her know that [name] recommends that she schedule a visit to discuss changing HRT and other options. Pt provided with appt.

## 2023-01-25 ENCOUNTER — Encounter: Payer: Self-pay | Admitting: *Deleted

## 2023-01-27 DIAGNOSIS — E663 Overweight: Secondary | ICD-10-CM | POA: Diagnosis not present

## 2023-01-27 DIAGNOSIS — R232 Flushing: Secondary | ICD-10-CM | POA: Diagnosis not present

## 2023-01-27 DIAGNOSIS — F411 Generalized anxiety disorder: Secondary | ICD-10-CM | POA: Diagnosis not present

## 2023-01-27 DIAGNOSIS — R632 Polyphagia: Secondary | ICD-10-CM | POA: Diagnosis not present

## 2023-02-03 ENCOUNTER — Ambulatory Visit: Payer: BC Managed Care – PPO | Admitting: Physician Assistant

## 2023-02-04 DIAGNOSIS — E559 Vitamin D deficiency, unspecified: Secondary | ICD-10-CM | POA: Diagnosis not present

## 2023-02-04 DIAGNOSIS — E782 Mixed hyperlipidemia: Secondary | ICD-10-CM | POA: Diagnosis not present

## 2023-02-04 DIAGNOSIS — I10 Essential (primary) hypertension: Secondary | ICD-10-CM | POA: Diagnosis not present

## 2023-02-09 ENCOUNTER — Telehealth (INDEPENDENT_AMBULATORY_CARE_PROVIDER_SITE_OTHER): Payer: BC Managed Care – PPO | Admitting: Obstetrics & Gynecology

## 2023-02-09 ENCOUNTER — Encounter (HOSPITAL_BASED_OUTPATIENT_CLINIC_OR_DEPARTMENT_OTHER): Payer: Self-pay | Admitting: Obstetrics & Gynecology

## 2023-02-09 DIAGNOSIS — N951 Menopausal and female climacteric states: Secondary | ICD-10-CM

## 2023-02-09 MED ORDER — ASPIRIN 81 MG PO TBEC: 81.0000 mg | DELAYED_RELEASE_TABLET | Freq: Every day | ORAL | 12 refills | Status: DC

## 2023-02-09 MED ORDER — ESTRADIOL 0.025 MG/24HR TD PTWK: 0.0250 mg | MEDICATED_PATCH | TRANSDERMAL | 1 refills | Status: DC

## 2023-02-09 NOTE — Progress Notes (Signed)
Virtual Visit via Video Note  I connected with Autumn [name] on 02/09/23 at  3:55 PM EST by a video enabled telemedicine application and verified that I am speaking with the correct person using two identifiers.  Location: Patient: [place] Provider: office   I discussed the limitations of evaluation and management by telemedicine and the availability of in person appointments. The patient expressed understanding and agreed to proceed.  History of Present Illness: 47 yo G2P2 MWF for virtual visits due to hot flashes.  She has tried veozah and "does not love this".  She decided to retry this.  Feels like she has more headaches with it.  This is not a typical side effect but this has been present.  She has   Jan 1st, she has try to cut the estradiol patch in half.  She feels like she has gone from menopause to worse menopausal symptoms.    She has discussed increased anxiety with Dr. Briscoe Deutscher.  She is now taking trazodone at night.  She has '50mg'$  dosage and is cutting this in half.    She has been on Effexor in the past but this was not on hot flashes.  This was so hard to come off and she really doesn't want to do this again.  She has been on gabapentin as well but this was not for hot flashes either.  We discussed this today.  Also, she has been on Lexapro and this caused fatigue.    She did see Dr. Marin Olp back in 2021.  He felt low dosed estrogen would be safe but to add a baby ASA and to let him know if she was going to travel on long flight.  They do not do extensive traveling right now.    Observations/Objective: WNWD WF, NAD  Assessment and Plan: 1. Vasomotor symptoms due to menopause - she has tried other options.  She is clearly aware that her risk for DVT is not as low as someone with no Factor V Leiden gene (she is heterozygous).  She has pregnancies, was on OCPs, and had surgery without any clotting risk.  She has seen Dr. Marin Olp for consult.  She feels very aware of  risks and still feels the risk is worth the benefit.  Will restart the estradiol 0.'025mg'$  dosage.   - estradiol (CLIMARA) 0.025 mg/24hr patch; Place 1 patch (0.025 mg total) onto the skin once a week.  Dispense: 4 patch; Refill: 1 - aspirin EC 81 MG tablet; Take 1 tablet (81 mg total) by mouth daily. Swallow whole.  Dispense: 30 tablet; Refill: 12   Follow Up Instructions: I discussed the assessment and treatment plan with the patient. The patient was provided an opportunity to ask questions and all were answered. The patient agreed with the plan and demonstrated an understanding of the instructions.   The patient was advised to call back or seek an in-person evaluation if the symptoms worsen or if the condition fails to improve as anticipated.  I provided 22 minutes of non-face-to-face time during this encounter.   Megan Salon, MD

## 2023-02-10 DIAGNOSIS — E782 Mixed hyperlipidemia: Secondary | ICD-10-CM | POA: Diagnosis not present

## 2023-02-10 DIAGNOSIS — E559 Vitamin D deficiency, unspecified: Secondary | ICD-10-CM | POA: Diagnosis not present

## 2023-02-10 DIAGNOSIS — F411 Generalized anxiety disorder: Secondary | ICD-10-CM | POA: Diagnosis not present

## 2023-02-10 DIAGNOSIS — G4700 Insomnia, unspecified: Secondary | ICD-10-CM | POA: Diagnosis not present

## 2023-02-18 DIAGNOSIS — D225 Melanocytic nevi of trunk: Secondary | ICD-10-CM | POA: Diagnosis not present

## 2023-02-18 DIAGNOSIS — Z85820 Personal history of malignant melanoma of skin: Secondary | ICD-10-CM | POA: Diagnosis not present

## 2023-02-18 DIAGNOSIS — L400 Psoriasis vulgaris: Secondary | ICD-10-CM | POA: Diagnosis not present

## 2023-02-18 DIAGNOSIS — C44529 Squamous cell carcinoma of skin of other part of trunk: Secondary | ICD-10-CM | POA: Diagnosis not present

## 2023-02-18 DIAGNOSIS — L821 Other seborrheic keratosis: Secondary | ICD-10-CM | POA: Diagnosis not present

## 2023-02-24 ENCOUNTER — Ambulatory Visit: Payer: BC Managed Care – PPO | Admitting: Physician Assistant

## 2023-02-26 DIAGNOSIS — F411 Generalized anxiety disorder: Secondary | ICD-10-CM | POA: Diagnosis not present

## 2023-03-10 ENCOUNTER — Other Ambulatory Visit (HOSPITAL_COMMUNITY): Payer: Self-pay

## 2023-03-10 ENCOUNTER — Ambulatory Visit: Payer: BC Managed Care – PPO

## 2023-03-10 DIAGNOSIS — R632 Polyphagia: Secondary | ICD-10-CM | POA: Diagnosis not present

## 2023-03-10 DIAGNOSIS — F411 Generalized anxiety disorder: Secondary | ICD-10-CM | POA: Diagnosis not present

## 2023-03-10 DIAGNOSIS — E669 Obesity, unspecified: Secondary | ICD-10-CM | POA: Diagnosis not present

## 2023-03-10 DIAGNOSIS — R232 Flushing: Secondary | ICD-10-CM | POA: Diagnosis not present

## 2023-03-10 MED ORDER — ZEPBOUND 15 MG/0.5ML ~~LOC~~ SOAJ
SUBCUTANEOUS | 0 refills | Status: DC
  Filled 2023-03-10: qty 2, 28d supply, fill #0

## 2023-03-31 ENCOUNTER — Encounter (HOSPITAL_BASED_OUTPATIENT_CLINIC_OR_DEPARTMENT_OTHER): Payer: Self-pay | Admitting: Obstetrics & Gynecology

## 2023-03-31 ENCOUNTER — Other Ambulatory Visit (HOSPITAL_BASED_OUTPATIENT_CLINIC_OR_DEPARTMENT_OTHER): Payer: Self-pay | Admitting: *Deleted

## 2023-03-31 MED ORDER — ESTRADIOL 0.025 MG/24HR TD PTTW: MEDICATED_PATCH | TRANSDERMAL | 1 refills | Status: DC

## 2023-03-31 NOTE — Progress Notes (Signed)
Pt called requesting refill on estradiol patch. She prefers the twice weekly patch over the weekly patch. [facility] Refill sent. Advised to let us know if she has any problems getting the prescription filled.

## 2023-04-14 ENCOUNTER — Ambulatory Visit
Admission: RE | Admit: 2023-04-14 | Discharge: 2023-04-14 | Disposition: A | Discharge status: Home / Self Care | Payer: BC Managed Care – PPO | Source: Ambulatory Visit | Attending: Obstetrics & Gynecology | Admitting: Obstetrics & Gynecology

## 2023-04-14 DIAGNOSIS — Z1231 Encounter for screening mammogram for malignant neoplasm of breast: Secondary | ICD-10-CM

## 2023-04-22 ENCOUNTER — Other Ambulatory Visit (HOSPITAL_COMMUNITY): Payer: Self-pay

## 2023-04-22 DIAGNOSIS — G43809 Other migraine, not intractable, without status migrainosus: Secondary | ICD-10-CM | POA: Diagnosis not present

## 2023-04-22 DIAGNOSIS — G4709 Other insomnia: Secondary | ICD-10-CM | POA: Diagnosis not present

## 2023-04-22 DIAGNOSIS — E559 Vitamin D deficiency, unspecified: Secondary | ICD-10-CM | POA: Diagnosis not present

## 2023-04-22 DIAGNOSIS — R5383 Other fatigue: Secondary | ICD-10-CM | POA: Diagnosis not present

## 2023-04-22 MED ORDER — TRAZODONE HCL 50 MG PO TABS
25.0000 mg | ORAL_TABLET | Freq: Every evening | ORAL | 3 refills | Status: DC | PRN
  Filled 2023-04-22: qty 30, 30d supply, fill #0
  Filled 2023-06-14: qty 30, 30d supply, fill #1
  Filled 2023-08-30: qty 30, 30d supply, fill #2
  Filled 2023-10-18: qty 30, 30d supply, fill #3

## 2023-04-22 MED ORDER — ZEPBOUND 15 MG/0.5ML ~~LOC~~ SOAJ
15.0000 mg | SUBCUTANEOUS | 0 refills | Status: DC
  Filled 2023-04-22: qty 2, 28d supply, fill #0

## 2023-04-26 ENCOUNTER — Other Ambulatory Visit (HOSPITAL_COMMUNITY): Payer: Self-pay

## 2023-04-28 ENCOUNTER — Other Ambulatory Visit (HOSPITAL_COMMUNITY): Payer: Self-pay

## 2023-04-28 DIAGNOSIS — T7840XA Allergy, unspecified, initial encounter: Secondary | ICD-10-CM | POA: Diagnosis not present

## 2023-04-28 DIAGNOSIS — J069 Acute upper respiratory infection, unspecified: Secondary | ICD-10-CM | POA: Diagnosis not present

## 2023-04-30 ENCOUNTER — Other Ambulatory Visit (HOSPITAL_COMMUNITY): Payer: Self-pay

## 2023-05-06 ENCOUNTER — Other Ambulatory Visit (HOSPITAL_COMMUNITY): Payer: Self-pay

## 2023-05-15 ENCOUNTER — Other Ambulatory Visit (HOSPITAL_COMMUNITY): Payer: Self-pay

## 2023-05-18 ENCOUNTER — Other Ambulatory Visit (HOSPITAL_COMMUNITY): Payer: Self-pay

## 2023-05-20 ENCOUNTER — Other Ambulatory Visit (HOSPITAL_COMMUNITY): Payer: Self-pay

## 2023-05-21 ENCOUNTER — Other Ambulatory Visit (HOSPITAL_COMMUNITY): Payer: Self-pay

## 2023-05-25 ENCOUNTER — Other Ambulatory Visit (HOSPITAL_COMMUNITY): Payer: Self-pay

## 2023-05-25 MED ORDER — ZEPBOUND 15 MG/0.5ML ~~LOC~~ SOAJ
15.0000 mg | SUBCUTANEOUS | 1 refills | Status: DC
  Filled 2023-05-25: qty 2, 28d supply, fill #0
  Filled 2024-03-14: qty 2, 28d supply, fill #1

## 2023-05-26 ENCOUNTER — Other Ambulatory Visit (HOSPITAL_COMMUNITY): Payer: Self-pay

## 2023-05-26 MED ORDER — ZEPBOUND 15 MG/0.5ML ~~LOC~~ SOAJ
SUBCUTANEOUS | 1 refills | Status: DC
  Filled 2023-10-18: qty 2, 28d supply, fill #0
  Filled 2023-12-02: qty 2, 28d supply, fill #1

## 2023-05-26 MED ORDER — QULIPTA 60 MG PO TABS
ORAL_TABLET | ORAL | 3 refills | Status: DC
  Filled 2023-05-26: qty 30, 30d supply, fill #0

## 2023-05-27 ENCOUNTER — Other Ambulatory Visit (HOSPITAL_COMMUNITY): Payer: Self-pay

## 2023-05-27 MED ORDER — ZEPBOUND 2.5 MG/0.5ML ~~LOC~~ SOAJ
2.5000 mg | SUBCUTANEOUS | 0 refills | Status: DC
  Filled 2023-05-27 – 2023-06-15 (×2): qty 2, 28d supply, fill #0

## 2023-05-31 ENCOUNTER — Other Ambulatory Visit (HOSPITAL_COMMUNITY): Payer: Self-pay

## 2023-06-03 ENCOUNTER — Other Ambulatory Visit (HOSPITAL_COMMUNITY): Payer: Self-pay

## 2023-06-14 DIAGNOSIS — M2391 Unspecified internal derangement of right knee: Secondary | ICD-10-CM | POA: Diagnosis not present

## 2023-06-14 DIAGNOSIS — M1711 Unilateral primary osteoarthritis, right knee: Secondary | ICD-10-CM | POA: Diagnosis not present

## 2023-06-15 ENCOUNTER — Other Ambulatory Visit (HOSPITAL_COMMUNITY): Payer: Self-pay

## 2023-06-16 ENCOUNTER — Other Ambulatory Visit: Payer: Self-pay

## 2023-06-29 ENCOUNTER — Other Ambulatory Visit (HOSPITAL_COMMUNITY): Payer: Self-pay

## 2023-06-29 DIAGNOSIS — G8929 Other chronic pain: Secondary | ICD-10-CM | POA: Diagnosis not present

## 2023-06-29 DIAGNOSIS — R110 Nausea: Secondary | ICD-10-CM | POA: Diagnosis not present

## 2023-06-29 DIAGNOSIS — E559 Vitamin D deficiency, unspecified: Secondary | ICD-10-CM | POA: Diagnosis not present

## 2023-06-29 DIAGNOSIS — M25561 Pain in right knee: Secondary | ICD-10-CM | POA: Diagnosis not present

## 2023-06-29 MED ORDER — ONDANSETRON HCL 4 MG PO TABS
4.0000 mg | ORAL_TABLET | Freq: Four times a day (QID) | ORAL | 1 refills | Status: AC | PRN
  Filled 2023-06-29: qty 24, 30d supply, fill #0

## 2023-06-29 MED ORDER — UBRELVY 100 MG PO TABS
100.0000 mg | ORAL_TABLET | Freq: Every day | ORAL | 1 refills | Status: DC
  Filled 2023-06-29: qty 16, 27d supply, fill #0
  Filled 2023-10-18: qty 16, 27d supply, fill #1
  Filled 2023-12-02: qty 16, 27d supply, fill #2

## 2023-06-30 DIAGNOSIS — L814 Other melanin hyperpigmentation: Secondary | ICD-10-CM | POA: Diagnosis not present

## 2023-06-30 DIAGNOSIS — Z85828 Personal history of other malignant neoplasm of skin: Secondary | ICD-10-CM | POA: Diagnosis not present

## 2023-06-30 DIAGNOSIS — Z85820 Personal history of malignant melanoma of skin: Secondary | ICD-10-CM | POA: Diagnosis not present

## 2023-06-30 DIAGNOSIS — D224 Melanocytic nevi of scalp and neck: Secondary | ICD-10-CM | POA: Diagnosis not present

## 2023-07-02 ENCOUNTER — Other Ambulatory Visit (HOSPITAL_COMMUNITY): Payer: Self-pay

## 2023-07-08 ENCOUNTER — Other Ambulatory Visit (HOSPITAL_COMMUNITY): Payer: Self-pay

## 2023-08-06 DIAGNOSIS — R5381 Other malaise: Secondary | ICD-10-CM | POA: Diagnosis not present

## 2023-08-06 DIAGNOSIS — R5383 Other fatigue: Secondary | ICD-10-CM | POA: Diagnosis not present

## 2023-08-06 DIAGNOSIS — Z13220 Encounter for screening for lipoid disorders: Secondary | ICD-10-CM | POA: Diagnosis not present

## 2023-08-06 DIAGNOSIS — R635 Abnormal weight gain: Secondary | ICD-10-CM | POA: Diagnosis not present

## 2023-08-10 DIAGNOSIS — R632 Polyphagia: Secondary | ICD-10-CM | POA: Diagnosis not present

## 2023-08-10 DIAGNOSIS — F411 Generalized anxiety disorder: Secondary | ICD-10-CM | POA: Diagnosis not present

## 2023-08-10 DIAGNOSIS — E669 Obesity, unspecified: Secondary | ICD-10-CM | POA: Diagnosis not present

## 2023-08-10 DIAGNOSIS — G43809 Other migraine, not intractable, without status migrainosus: Secondary | ICD-10-CM | POA: Diagnosis not present

## 2023-08-12 DIAGNOSIS — Z0000 Encounter for general adult medical examination without abnormal findings: Secondary | ICD-10-CM | POA: Diagnosis not present

## 2023-08-12 DIAGNOSIS — Z23 Encounter for immunization: Secondary | ICD-10-CM | POA: Diagnosis not present

## 2023-08-19 DIAGNOSIS — M1711 Unilateral primary osteoarthritis, right knee: Secondary | ICD-10-CM | POA: Diagnosis not present

## 2023-08-19 DIAGNOSIS — M7061 Trochanteric bursitis, right hip: Secondary | ICD-10-CM | POA: Diagnosis not present

## 2023-08-19 DIAGNOSIS — M25551 Pain in right hip: Secondary | ICD-10-CM | POA: Diagnosis not present

## 2023-08-26 DIAGNOSIS — M1711 Unilateral primary osteoarthritis, right knee: Secondary | ICD-10-CM | POA: Diagnosis not present

## 2023-08-30 ENCOUNTER — Other Ambulatory Visit (HOSPITAL_COMMUNITY): Payer: Self-pay

## 2023-09-02 DIAGNOSIS — M1711 Unilateral primary osteoarthritis, right knee: Secondary | ICD-10-CM | POA: Diagnosis not present

## 2023-09-23 ENCOUNTER — Ambulatory Visit (HOSPITAL_BASED_OUTPATIENT_CLINIC_OR_DEPARTMENT_OTHER): Payer: BC Managed Care – PPO | Admitting: Obstetrics & Gynecology

## 2023-09-27 DIAGNOSIS — M25561 Pain in right knee: Secondary | ICD-10-CM | POA: Diagnosis not present

## 2023-09-27 DIAGNOSIS — R1013 Epigastric pain: Secondary | ICD-10-CM | POA: Diagnosis not present

## 2023-09-27 DIAGNOSIS — F330 Major depressive disorder, recurrent, mild: Secondary | ICD-10-CM | POA: Diagnosis not present

## 2023-09-27 DIAGNOSIS — G8929 Other chronic pain: Secondary | ICD-10-CM | POA: Diagnosis not present

## 2023-10-13 ENCOUNTER — Other Ambulatory Visit (HOSPITAL_BASED_OUTPATIENT_CLINIC_OR_DEPARTMENT_OTHER): Payer: Self-pay | Admitting: *Deleted

## 2023-10-13 ENCOUNTER — Encounter (HOSPITAL_BASED_OUTPATIENT_CLINIC_OR_DEPARTMENT_OTHER): Payer: Self-pay | Admitting: Obstetrics & Gynecology

## 2023-10-13 MED ORDER — ESTRADIOL 0.025 MG/24HR TD PTTW: MEDICATED_PATCH | TRANSDERMAL | 0 refills | Status: DC

## 2023-10-14 DIAGNOSIS — M7061 Trochanteric bursitis, right hip: Secondary | ICD-10-CM | POA: Diagnosis not present

## 2023-10-14 DIAGNOSIS — M1711 Unilateral primary osteoarthritis, right knee: Secondary | ICD-10-CM | POA: Diagnosis not present

## 2023-10-19 ENCOUNTER — Other Ambulatory Visit: Payer: Self-pay

## 2023-10-19 ENCOUNTER — Other Ambulatory Visit (HOSPITAL_COMMUNITY): Payer: Self-pay

## 2023-10-20 ENCOUNTER — Encounter (HOSPITAL_BASED_OUTPATIENT_CLINIC_OR_DEPARTMENT_OTHER): Payer: Self-pay | Admitting: Obstetrics & Gynecology

## 2023-10-20 ENCOUNTER — Ambulatory Visit (HOSPITAL_BASED_OUTPATIENT_CLINIC_OR_DEPARTMENT_OTHER): Payer: BC Managed Care – PPO | Admitting: Obstetrics & Gynecology

## 2023-10-20 VITALS — BP 117/69 | HR 71 | Ht 62.0 in | Wt 179.2 lb

## 2023-10-20 DIAGNOSIS — N951 Menopausal and female climacteric states: Secondary | ICD-10-CM | POA: Diagnosis not present

## 2023-10-20 DIAGNOSIS — Z90710 Acquired absence of both cervix and uterus: Secondary | ICD-10-CM

## 2023-10-20 DIAGNOSIS — D6851 Activated protein C resistance: Secondary | ICD-10-CM | POA: Diagnosis not present

## 2023-10-20 DIAGNOSIS — Z01419 Encounter for gynecological examination (general) (routine) without abnormal findings: Secondary | ICD-10-CM

## 2023-10-20 DIAGNOSIS — R6882 Decreased libido: Secondary | ICD-10-CM | POA: Diagnosis not present

## 2023-10-20 MED ORDER — ESTRADIOL 0.025 MG/24HR TD PTTW: MEDICATED_PATCH | TRANSDERMAL | 3 refills | Status: DC

## 2023-10-20 NOTE — Progress Notes (Signed)
47 y.o. G20P2002 Married White or Caucasian female here for annual exam.  On HRT, low dosage.  Having some struggles with some aspects of menopause  Really doesn't want to increase estrogen but has some questions about other menopausal symptoms and treatment.  Decreased libido is present.  Denies vaginal bleeding.   Patient's last menstrual period was 03/20/2020.          Sexually active: Yes.    The current method of family planning is status post hysterectomy.    Smoker:  no  Health Maintenance: Pap:  06/06/2019 Negative History of abnormal Pap:  no MMG:  04/14/2023 Negative Colonoscopy:  2022, Dr. Loreta Ave BMD:   not indicated  Screening Labs: Dr. Duanne Guess   reports that she has never smoked. She has never used smokeless tobacco. She reports that she does not currently use alcohol after a past usage of about 1.0 standard drink of alcohol per week. She reports that she does not use drugs.  Past Medical History:  Diagnosis Date   Anemia    Anxiety    Attention deficit hyperactivity disorder (ADHD) 02/16/2017   Sees [facility] for managment   Back pain    Calculus of kidney 06/07/2012   Overview:  Dr Vernie Ammons - urology    Cancer [facility])    melanoma removed from abdomen in 2000   Complication of anesthesia    low BP during C-section   Depression    Factor V Leiden carrier [facility])    heterozygous   Hernia, rectovaginal 06/07/2012   Overview:  Dr Rana Snare - gyn    History of abnormal cervical Pap smear 1997   dysplasia- LEEP   History of reconstructive repair of rectocele    IBS (irritable bowel syndrome)    Joint pain    Lower extremity edema    Migraine    Ovarian cyst 1994   Psoriasis 02/16/2017   -sees dermatologist   Psoriatic arthritis [facility])     Past Surgical History:  Procedure Laterality Date   BLADDER SUSPENSION N/A 04/08/2020   Procedure: POSSIBLE TRANSVAGINAL TAPE (TVT) PROCEDURE MIDURETHAL SLING;  Surgeon: Patton Salles, MD;  [facility];  Service: Gynecology;  Laterality: N/A;   CERVICAL BIOPSY  W/ LOOP ELECTRODE EXCISION     early 20's   CESAREAN SECTION  2007 & 2010   CYSTOSCOPY N/A 04/08/2020   Procedure: CYSTOSCOPY;  Surgeon: Patton Salles, MD;  Location: [facility];  Service: Gynecology;  Laterality: N/A;   KNEE ARTHROPLASTY Right 1989   OVARIAN CYST SURGERY  1994   RECTOCELE REPAIR N/A 04/08/2020   Procedure: POSTERIOR REPAIR (RECTOCELE) WITH NATIVE TISSUE REPAIR;  Surgeon: Patton Salles, MD;  Location: [facility];  Service: Gynecology;  Laterality: N/A;   SINUS ENDO WITH FUSION     TOTAL LAPAROSCOPIC HYSTERECTOMY WITH SALPINGECTOMY N/A 04/08/2020   Procedure: TOTAL LAPAROSCOPIC HYSTERECTOMY WITH BILATERAL SALPINGECTOMY;  Surgeon: Jerene Bears, MD;  Location: [facility];  Service: Gynecology;  Laterality: N/A;   TUBAL LIGATION  2010   VULVA /PERINEUM BIOPSY  01/2015    Current Outpatient Medications  Medication Sig Dispense Refill   aspirin EC 81 MG tablet Take 1 tablet (81 mg total) by mouth daily. Swallow whole. 30 tablet 12   estradiol (DOTTI) 0.025 MG/24HR Place 1 patch onto the skin 2 (two) times a week. 24 patch 0   lidocaine (XYLOCAINE) 5 % ointment Apply  topically to skin no more than once daily for pain 30 g 0   Multiple Vitamin (MULTIVITAMIN WITH MINERALS) TABS tablet Take 1 tablet by mouth daily.     ondansetron (ZOFRAN) 4 MG tablet Take 1 tablet (4 mg total) by mouth every 6 (six) hours as needed for nausea and vomiting 24 tablet 1   tirzepatide (ZEPBOUND) 15 MG/0.5ML Pen Inject 15 mg into the skin once a week. 2 mL 1   tirzepatide (ZEPBOUND) 15 MG/0.5ML Pen Inject 15 mg into the skin once a week. 2 mL 1   tirzepatide (ZEPBOUND) 2.5 MG/0.5ML Pen Inject 2.5 mg into the skin once a week. 2 mL 0   traZODone (DESYREL) 50 MG tablet Take 0.5-1 tablets (25-50 mg total) by mouth at bedtime as needed. 30 tablet 3    Ubrogepant (UBRELVY) 100 MG TABS Take 100 mg by mouth daily as needed (migraine). 30 tablet 0   Ubrogepant (UBRELVY) 100 MG TABS Take 1 tablet (100 mg total) by mouth, may repeat in 2 hours after first dose as needed. 30 tablet 1   No current facility-administered medications for this visit.    Family History  Problem Relation Age of Onset   Migraines Mother    Hyperlipidemia Mother    Hyperlipidemia Father    Cancer Father        throat   Diabetes Maternal Grandmother    Heart disease Maternal Grandmother    Emphysema Maternal Grandfather    Healthy Paternal Grandmother    Emphysema Paternal Grandfather    Healthy Son    Healthy Son    Breast cancer Maternal Aunt 76       stage 1   Esophageal cancer Maternal Uncle    Rectal cancer Maternal Uncle     ROS: Constitutional: negative Genitourinary:negative  Exam:   BP 117/69 (BP Location: Right Arm, Patient Position: Sitting, Cuff Size: Large)   Pulse 71   Ht 5\' 2"  (1.575 m)   Wt 179 lb 3.2 oz (81.3 kg)   LMP 03/20/2020   BMI 32.78 kg/m   Height: 5\' 2"  (157.5 cm)  General appearance: alert, cooperative and appears stated age Head: Normocephalic, without obvious abnormality, atraumatic Neck: no adenopathy, supple, symmetrical, trachea midline and thyroid normal to inspection and palpation Lungs: clear to auscultation bilaterally Breasts: normal appearance, no masses or tenderness Heart: regular rate and rhythm Abdomen: soft, non-tender; bowel sounds normal; no masses,  no organomegaly Extremities: extremities normal, atraumatic, no cyanosis or edema Skin: Skin color, texture, turgor normal. No rashes or lesions Lymph nodes: Cervical, supraclavicular, and axillary nodes normal. No abnormal inguinal nodes palpated Neurologic: Grossly normal   Pelvic: External genitalia:  no lesions              Urethra:  normal appearing urethra with no masses, tenderness or lesions              Bartholins and Skenes: normal                  Vagina: normal appearing vagina with normal color and no discharge, no lesions              Cervix: absent              Pap taken: No. Bimanual Exam:  Uterus:  uterus absent              Adnexa: normal adnexa               Rectovaginal: Confirms  Anus:  normal sphincter tone, no lesions  Chaperone, Ina Homes, CMA, was present for exam.  Assessment/Plan: 1. Well woman exam with routine gynecological exam - Pap smear not indicated - Mammogram 04/14/2023 - Colonoscopy 2022, dr Loreta Ave - Bone mineral density not indicated - lab work done with PCP, Dr. Duanne Guess - vaccines reviewed/updated  2. Decreased libido - will check testosterone level.  If low, will add compounded topical testosterone to regimen.  Pt understands this is off label. - Testosterone, Total, LC/MS/MS  3. Heterozygous factor V Leiden mutation ([facility]) - has seen Dr. Myna Hidalgo who felt HRT was comfortable with this pt  4. Menopausal symptoms  5. History of hysterectomy

## 2023-10-20 NOTE — Patient Instructions (Signed)
 Non hormonal  Coconut oil twice weekly Replens vaginal moisturizer twice weekly Revaree - vaginal hyaluronic acid suppository twice weekly Vit E vaginal suppositories  Hyaluronic acid products -- Autumn [name] is an [place] product that is a lubricant   Hormonal  Estradiol cream -- twice weekly (prescription)

## 2023-10-25 DIAGNOSIS — F419 Anxiety disorder, unspecified: Secondary | ICD-10-CM | POA: Diagnosis not present

## 2023-10-25 LAB — TESTOSTERONE, TOTAL, LC/MS/MS: Testosterone, total: 14.7 ng/dL

## 2023-10-28 MED ORDER — NONFORMULARY OR COMPOUNDED ITEM
1 refills | Status: DC
Start: 2023-10-28 — End: 2023-12-03

## 2023-10-28 NOTE — Addendum Note (Signed)
Addended by: Jerene Bears on: 10/28/2023 12:00 PM   Modules accepted: Orders

## 2023-10-29 ENCOUNTER — Encounter (HOSPITAL_BASED_OUTPATIENT_CLINIC_OR_DEPARTMENT_OTHER): Payer: Self-pay | Admitting: *Deleted

## 2023-11-02 ENCOUNTER — Other Ambulatory Visit (HOSPITAL_COMMUNITY): Payer: Self-pay

## 2023-11-02 ENCOUNTER — Encounter (HOSPITAL_COMMUNITY): Payer: Self-pay

## 2023-11-03 ENCOUNTER — Other Ambulatory Visit (HOSPITAL_COMMUNITY): Payer: Self-pay

## 2023-11-08 DIAGNOSIS — F419 Anxiety disorder, unspecified: Secondary | ICD-10-CM | POA: Diagnosis not present

## 2023-11-16 DIAGNOSIS — Z1331 Encounter for screening for depression: Secondary | ICD-10-CM | POA: Diagnosis not present

## 2023-11-16 DIAGNOSIS — G43809 Other migraine, not intractable, without status migrainosus: Secondary | ICD-10-CM | POA: Diagnosis not present

## 2023-11-16 DIAGNOSIS — R632 Polyphagia: Secondary | ICD-10-CM | POA: Diagnosis not present

## 2023-11-16 DIAGNOSIS — F908 Attention-deficit hyperactivity disorder, other type: Secondary | ICD-10-CM | POA: Diagnosis not present

## 2023-11-16 DIAGNOSIS — E559 Vitamin D deficiency, unspecified: Secondary | ICD-10-CM | POA: Diagnosis not present

## 2023-12-02 ENCOUNTER — Other Ambulatory Visit: Payer: Self-pay

## 2023-12-02 ENCOUNTER — Other Ambulatory Visit (HOSPITAL_BASED_OUTPATIENT_CLINIC_OR_DEPARTMENT_OTHER): Payer: Self-pay | Admitting: *Deleted

## 2023-12-02 ENCOUNTER — Other Ambulatory Visit (HOSPITAL_COMMUNITY): Payer: Self-pay

## 2023-12-02 MED ORDER — ESTRADIOL 0.025 MG/24HR TD PTTW: MEDICATED_PATCH | TRANSDERMAL | 3 refills | Status: DC

## 2023-12-02 MED ORDER — TRAZODONE HCL 50 MG PO TABS
25.0000 mg | ORAL_TABLET | Freq: Every evening | ORAL | 3 refills | Status: DC | PRN
  Filled 2023-12-02: qty 30, 30d supply, fill #0
  Filled 2024-03-13: qty 30, 30d supply, fill #1
  Filled 2024-05-23: qty 30, 30d supply, fill #2
  Filled 2024-07-26: qty 30, 30d supply, fill #3

## 2023-12-03 ENCOUNTER — Other Ambulatory Visit (HOSPITAL_BASED_OUTPATIENT_CLINIC_OR_DEPARTMENT_OTHER): Payer: Self-pay | Admitting: Obstetrics & Gynecology

## 2023-12-03 ENCOUNTER — Other Ambulatory Visit: Payer: Self-pay | Admitting: Obstetrics & Gynecology

## 2023-12-03 DIAGNOSIS — Z862 Personal history of diseases of the blood and blood-forming organs and certain disorders involving the immune mechanism: Secondary | ICD-10-CM | POA: Diagnosis not present

## 2023-12-03 DIAGNOSIS — R5383 Other fatigue: Secondary | ICD-10-CM

## 2023-12-03 MED ORDER — NONFORMULARY OR COMPOUNDED ITEM: Status: DC

## 2023-12-04 LAB — CBC
Hematocrit: 39.3 % (ref 34.0–46.6)
Hemoglobin: 12.4 g/dL (ref 11.1–15.9)
MCH: 27.6 pg (ref 26.6–33.0)
MCHC: 31.6 g/dL (ref 31.5–35.7)
MCV: 87 fL (ref 79–97)
Platelets: 255 10*3/uL (ref 150–450)
RBC: 4.5 x10E6/uL (ref 3.77–5.28)
RDW: 12.6 % (ref 11.7–15.4)
WBC: 7.6 10*3/uL (ref 3.4–10.8)

## 2023-12-04 LAB — IRON,TIBC AND FERRITIN PANEL
Ferritin: 8 ng/mL — ABNORMAL LOW (ref 15–150)
Iron Saturation: 9 % — CL (ref 15–55)
Iron: 32 ug/dL (ref 27–159)
Total Iron Binding Capacity: 337 ug/dL (ref 250–450)
UIBC: 305 ug/dL (ref 131–425)

## 2023-12-14 ENCOUNTER — Ambulatory Visit: Payer: BC Managed Care – PPO | Admitting: Sports Medicine

## 2023-12-15 ENCOUNTER — Other Ambulatory Visit (HOSPITAL_BASED_OUTPATIENT_CLINIC_OR_DEPARTMENT_OTHER): Payer: Self-pay | Admitting: Obstetrics & Gynecology

## 2023-12-17 ENCOUNTER — Encounter (HOSPITAL_BASED_OUTPATIENT_CLINIC_OR_DEPARTMENT_OTHER): Payer: Self-pay | Admitting: Obstetrics & Gynecology

## 2023-12-17 ENCOUNTER — Ambulatory Visit (HOSPITAL_BASED_OUTPATIENT_CLINIC_OR_DEPARTMENT_OTHER): Payer: BC Managed Care – PPO | Admitting: Obstetrics & Gynecology

## 2023-12-17 VITALS — BP 123/74 | HR 76 | Ht 62.0 in | Wt 179.4 lb

## 2023-12-17 DIAGNOSIS — N6321 Unspecified lump in the left breast, upper outer quadrant: Secondary | ICD-10-CM

## 2023-12-17 NOTE — Progress Notes (Signed)
 GYNECOLOGY  VISIT  CC:   left mass/thickening  HPI: 48 y.o. G51P2002 Married White or Caucasian female here for complaint of left breast thickening that she noticed on Wednesday.  No recent trauma.  No nipple discharge.  Last mammogram was 04/2023.  H/o Maternal aunt with breast cancer but genetic testing was negative.  Pt is on HRT.   Past Medical History:  Diagnosis Date   Anemia    Anxiety    Attention deficit hyperactivity disorder (ADHD) 02/16/2017   Sees [facility] for managment   Back pain    Calculus of kidney 06/07/2012   Overview:  Dr Ottelin - urology    Cancer [facility])    melanoma removed from abdomen in 2000   Complication of anesthesia    low BP during C-section   Depression    Factor V Leiden carrier [facility])    heterozygous   Hernia, rectovaginal 06/07/2012   Overview:  Dr Marget - gyn    History of abnormal cervical Pap smear 1997   dysplasia- LEEP   History of reconstructive repair of rectocele    IBS (irritable bowel syndrome)    Joint pain    Lower extremity edema    Migraine    Ovarian cyst 1994   Psoriasis 02/16/2017   -sees dermatologist   Psoriatic arthritis (HCC)     MEDS:   Current Outpatient Medications on File Prior to Visit  Medication Sig Dispense Refill   aspirin  EC 81 MG tablet Take 1 tablet (81 mg total) by mouth daily. Swallow whole. 30 tablet 12   estradiol  (DOTTI ) 0.025 MG/24HR Place 1 patch onto the skin 2 (two) times a week. 24 patch 3   Multiple Vitamin (MULTIVITAMIN WITH MINERALS) TABS tablet Take 1 tablet by mouth daily.     NONFORMULARY OR COMPOUNDED ITEM Topical testosterone  cream 1mg /0.8ml.  Apply topically 0.05ml topically three times weekly.  Disp: 3 month supply.  #1RF.     ondansetron  (ZOFRAN ) 4 MG tablet Take 1 tablet (4 mg total) by mouth every 6 (six) hours as needed for nausea and vomiting 24 tablet 1   tirzepatide  (ZEPBOUND ) 15 MG/0.5ML Pen Inject 15 mg into the skin once a week. 2 mL 1   traZODone  (DESYREL ) 50  MG tablet Take 0.5-1 tablets (25-50 mg total) by mouth at bedtime as needed. 30 tablet 3   Ubrogepant  (UBRELVY ) 100 MG TABS Take 100 mg by mouth daily as needed (migraine). 30 tablet 0   tirzepatide  (ZEPBOUND ) 15 MG/0.5ML Pen Inject 15 mg into the skin once a week. (Patient not taking: Reported on 12/17/2023) 2 mL 1   tirzepatide  (ZEPBOUND ) 2.5 MG/0.5ML Pen Inject 2.5 mg into the skin once a week. (Patient not taking: Reported on 12/17/2023) 2 mL 0   Ubrogepant  (UBRELVY ) 100 MG TABS Take 1 tablet (100 mg total) by mouth, may repeat in 2 hours after first dose as needed. (Patient not taking: Reported on 12/17/2023) 30 tablet 1   No current facility-administered medications on file prior to visit.    ALLERGIES: Patient has no known allergies.  SH:  married, non smoker  Review of Systems  Constitutional: Negative.   Genitourinary: Negative.     PHYSICAL EXAMINATION:    BP 123/74   Pulse 76   Ht 5' 2 (1.575 m)   Wt 179 lb 6.4 oz (81.4 kg)   LMP 03/20/2020   BMI 32.81 kg/m     Physical Exam Constitutional:      Appearance: Normal appearance.  Chest:  Breasts:    Right: No inverted nipple, mass, nipple discharge, skin change or tenderness.     Left: Mass present. No inverted nipple, nipple discharge, skin change or tenderness.    Lymphadenopathy:     Upper Body:     Right upper body: No supraclavicular, axillary or pectoral adenopathy.     Left upper body: No supraclavicular, axillary or pectoral adenopathy.  Neurological:     General: No focal deficit present.     Mental Status: She is alert.  Psychiatric:        Mood and Affect: Mood normal.      Assessment/Plan: 1. Mass of upper outer quadrant of left breast (Primary) - discussed proceeding with imaging unless symptoms resolve - MM Digital Diagnostic Unilat L; Future - US  LIMITED ULTRASOUND INCLUDING AXILLA LEFT BREAST ; Future

## 2023-12-21 ENCOUNTER — Other Ambulatory Visit: Payer: Self-pay | Admitting: Medical Genetics

## 2023-12-31 DIAGNOSIS — Z6831 Body mass index (BMI) 31.0-31.9, adult: Secondary | ICD-10-CM | POA: Diagnosis not present

## 2023-12-31 DIAGNOSIS — L4050 Arthropathic psoriasis, unspecified: Secondary | ICD-10-CM | POA: Diagnosis not present

## 2023-12-31 DIAGNOSIS — Z79899 Other long term (current) drug therapy: Secondary | ICD-10-CM | POA: Diagnosis not present

## 2023-12-31 DIAGNOSIS — F411 Generalized anxiety disorder: Secondary | ICD-10-CM | POA: Diagnosis not present

## 2024-01-05 ENCOUNTER — Ambulatory Visit
Admission: RE | Admit: 2024-01-05 | Discharge: 2024-01-05 | Disposition: A | Discharge status: Home / Self Care | Payer: BC Managed Care – PPO | Source: Ambulatory Visit | Attending: Obstetrics & Gynecology | Admitting: Obstetrics & Gynecology

## 2024-01-05 DIAGNOSIS — N6321 Unspecified lump in the left breast, upper outer quadrant: Secondary | ICD-10-CM

## 2024-01-05 DIAGNOSIS — N6459 Other signs and symptoms in breast: Secondary | ICD-10-CM | POA: Diagnosis not present

## 2024-01-07 ENCOUNTER — Other Ambulatory Visit (HOSPITAL_COMMUNITY): Payer: Self-pay

## 2024-01-14 ENCOUNTER — Ambulatory Visit: Payer: BC Managed Care – PPO | Admitting: Family Medicine

## 2024-01-19 ENCOUNTER — Other Ambulatory Visit (HOSPITAL_COMMUNITY)
Admission: RE | Admit: 2024-01-19 | Discharge: 2024-01-19 | Disposition: A | Discharge status: Home / Self Care | Payer: Self-pay | Source: Ambulatory Visit | Attending: Medical Genetics | Admitting: Medical Genetics

## 2024-01-19 DIAGNOSIS — F419 Anxiety disorder, unspecified: Secondary | ICD-10-CM | POA: Diagnosis not present

## 2024-01-27 ENCOUNTER — Ambulatory Visit (HOSPITAL_BASED_OUTPATIENT_CLINIC_OR_DEPARTMENT_OTHER): Payer: BC Managed Care – PPO | Admitting: Obstetrics & Gynecology

## 2024-01-31 ENCOUNTER — Ambulatory Visit (HOSPITAL_BASED_OUTPATIENT_CLINIC_OR_DEPARTMENT_OTHER): Payer: BC Managed Care – PPO | Admitting: Obstetrics & Gynecology

## 2024-02-01 ENCOUNTER — Encounter (HOSPITAL_BASED_OUTPATIENT_CLINIC_OR_DEPARTMENT_OTHER): Payer: Self-pay | Admitting: Certified Nurse Midwife

## 2024-02-01 ENCOUNTER — Ambulatory Visit (HOSPITAL_BASED_OUTPATIENT_CLINIC_OR_DEPARTMENT_OTHER): Payer: BC Managed Care – PPO | Admitting: Certified Nurse Midwife

## 2024-02-01 VITALS — BP 96/74 | HR 64 | Ht 62.0 in | Wt 174.0 lb

## 2024-02-01 DIAGNOSIS — R600 Localized edema: Secondary | ICD-10-CM | POA: Diagnosis not present

## 2024-02-01 DIAGNOSIS — R790 Abnormal level of blood mineral: Secondary | ICD-10-CM | POA: Diagnosis not present

## 2024-02-01 DIAGNOSIS — R232 Flushing: Secondary | ICD-10-CM

## 2024-02-01 MED ORDER — HYDROCHLOROTHIAZIDE 12.5 MG PO TABS: 12.5000 mg | ORAL_TABLET | ORAL | 1 refills | Status: DC | PRN

## 2024-02-01 NOTE — Progress Notes (Unsigned)
  Hysterectomy  She is using Estradiol patch twice weekly. Reports patch seems to be helping to reduce hot flashes. She tried testosterone but it lead to acne on face and she had to discontinue it. She does have some fatigue/libido issues at times.  Pt experienced severe swelling in her 2nd pregnancy. Over the years (14) she has noticed occasional lower extremity edema (pre-tibial/pedal). She has noticed it more persistently over the past few months. She stays hydrated. Voids often (and clear). BP remains normal with no hypertension.  She

## 2024-02-02 DIAGNOSIS — D224 Melanocytic nevi of scalp and neck: Secondary | ICD-10-CM | POA: Diagnosis not present

## 2024-02-02 DIAGNOSIS — F419 Anxiety disorder, unspecified: Secondary | ICD-10-CM | POA: Diagnosis not present

## 2024-02-02 DIAGNOSIS — L400 Psoriasis vulgaris: Secondary | ICD-10-CM | POA: Diagnosis not present

## 2024-02-02 DIAGNOSIS — Z85820 Personal history of malignant melanoma of skin: Secondary | ICD-10-CM | POA: Diagnosis not present

## 2024-02-02 DIAGNOSIS — Z85828 Personal history of other malignant neoplasm of skin: Secondary | ICD-10-CM | POA: Diagnosis not present

## 2024-02-02 LAB — THYROID PANEL WITH TSH
Free Thyroxine Index: 2.1 (ref 1.2–4.9)
T3 Uptake Ratio: 26 % (ref 24–39)
T4, Total: 8.1 ug/dL (ref 4.5–12.0)
TSH: 1.89 u[IU]/mL (ref 0.450–4.500)

## 2024-02-02 LAB — COMPREHENSIVE METABOLIC PANEL
ALT: 20 [IU]/L (ref 0–32)
AST: 20 [IU]/L (ref 0–40)
Albumin: 4 g/dL (ref 3.9–4.9)
Alkaline Phosphatase: 89 [IU]/L (ref 44–121)
BUN/Creatinine Ratio: 14 (ref 9–23)
BUN: 10 mg/dL (ref 6–24)
Bilirubin Total: 0.3 mg/dL (ref 0.0–1.2)
CO2: 27 mmol/L (ref 20–29)
Calcium: 8.8 mg/dL (ref 8.7–10.2)
Chloride: 103 mmol/L (ref 96–106)
Creatinine, Ser: 0.7 mg/dL (ref 0.57–1.00)
Globulin, Total: 2.3 g/dL (ref 1.5–4.5)
Glucose: 90 mg/dL (ref 70–99)
Potassium: 4 mmol/L (ref 3.5–5.2)
Sodium: 141 mmol/L (ref 134–144)
Total Protein: 6.3 g/dL (ref 6.0–8.5)
eGFR: 106 mL/min/{1.73_m2} (ref >59)

## 2024-02-02 LAB — GENECONNECT MOLECULAR SCREEN: Genetic Analysis Overall Interpretation: NEGATIVE

## 2024-02-02 LAB — IRON,TIBC AND FERRITIN PANEL
Ferritin: 10 ng/mL — ABNORMAL LOW (ref 15–150)
Iron Saturation: 10 % — ABNORMAL LOW (ref 15–55)
Iron: 32 ug/dL (ref 27–159)
Total Iron Binding Capacity: 307 ug/dL (ref 250–450)
UIBC: 275 ug/dL (ref 131–425)

## 2024-02-02 LAB — CBC
Hematocrit: 36.5 % (ref 34.0–46.6)
Hemoglobin: 11.6 g/dL (ref 11.1–15.9)
MCH: 26 pg — ABNORMAL LOW (ref 26.6–33.0)
MCHC: 31.8 g/dL (ref 31.5–35.7)
MCV: 82 fL (ref 79–97)
Platelets: 244 10*3/uL (ref 150–450)
RBC: 4.47 x10E6/uL (ref 3.77–5.28)
RDW: 14.8 % (ref 11.7–15.4)
WBC: 7.4 10*3/uL (ref 3.4–10.8)

## 2024-02-02 LAB — FOLLICLE STIMULATING HORMONE: FSH: 65.4 m[IU]/mL

## 2024-02-04 ENCOUNTER — Encounter (HOSPITAL_BASED_OUTPATIENT_CLINIC_OR_DEPARTMENT_OTHER): Payer: Self-pay | Admitting: Certified Nurse Midwife

## 2024-02-04 DIAGNOSIS — R790 Abnormal level of blood mineral: Secondary | ICD-10-CM | POA: Insufficient documentation

## 2024-02-04 DIAGNOSIS — R600 Localized edema: Secondary | ICD-10-CM | POA: Insufficient documentation

## 2024-02-04 DIAGNOSIS — R232 Flushing: Secondary | ICD-10-CM | POA: Insufficient documentation

## 2024-02-07 ENCOUNTER — Encounter (HOSPITAL_BASED_OUTPATIENT_CLINIC_OR_DEPARTMENT_OTHER): Payer: Self-pay | Admitting: Certified Nurse Midwife

## 2024-02-07 ENCOUNTER — Other Ambulatory Visit (HOSPITAL_BASED_OUTPATIENT_CLINIC_OR_DEPARTMENT_OTHER): Payer: Self-pay | Admitting: Certified Nurse Midwife

## 2024-02-07 DIAGNOSIS — R790 Abnormal level of blood mineral: Secondary | ICD-10-CM | POA: Insufficient documentation

## 2024-02-07 MED ORDER — ACCRUFER 30 MG PO CAPS
1.0000 | ORAL_CAPSULE | Freq: Every day | ORAL | 12 refills | Status: DC
Start: 2024-02-07 — End: 2024-02-08

## 2024-02-08 ENCOUNTER — Other Ambulatory Visit (HOSPITAL_BASED_OUTPATIENT_CLINIC_OR_DEPARTMENT_OTHER): Payer: Self-pay | Admitting: Certified Nurse Midwife

## 2024-02-08 DIAGNOSIS — R790 Abnormal level of blood mineral: Secondary | ICD-10-CM

## 2024-02-08 MED ORDER — ACCRUFER 30 MG PO CAPS: 1.0000 | ORAL_CAPSULE | Freq: Every day | ORAL | 12 refills | Status: AC

## 2024-02-09 DIAGNOSIS — F419 Anxiety disorder, unspecified: Secondary | ICD-10-CM | POA: Diagnosis not present

## 2024-02-11 ENCOUNTER — Other Ambulatory Visit (HOSPITAL_COMMUNITY): Payer: Self-pay

## 2024-02-11 DIAGNOSIS — F5081 Binge eating disorder, mild: Secondary | ICD-10-CM | POA: Diagnosis not present

## 2024-02-11 DIAGNOSIS — F411 Generalized anxiety disorder: Secondary | ICD-10-CM | POA: Diagnosis not present

## 2024-02-11 DIAGNOSIS — R790 Abnormal level of blood mineral: Secondary | ICD-10-CM | POA: Diagnosis not present

## 2024-02-11 DIAGNOSIS — R600 Localized edema: Secondary | ICD-10-CM | POA: Diagnosis not present

## 2024-02-11 MED ORDER — LISDEXAMFETAMINE DIMESYLATE 20 MG PO CHEW
20.0000 mg | CHEWABLE_TABLET | Freq: Every morning | ORAL | 0 refills | Status: DC
  Filled 2024-02-11: qty 30, 30d supply, fill #0

## 2024-02-18 ENCOUNTER — Telehealth (HOSPITAL_BASED_OUTPATIENT_CLINIC_OR_DEPARTMENT_OTHER): Payer: Self-pay | Admitting: Obstetrics & Gynecology

## 2024-02-18 ENCOUNTER — Other Ambulatory Visit (HOSPITAL_BASED_OUTPATIENT_CLINIC_OR_DEPARTMENT_OTHER): Payer: Self-pay | Admitting: Obstetrics & Gynecology

## 2024-02-18 DIAGNOSIS — R790 Abnormal level of blood mineral: Secondary | ICD-10-CM

## 2024-02-18 NOTE — Telephone Encounter (Signed)
 Called patient to discuss recent ferritin level.  She is not having any vaginal bleeding and I do not have a good reason for her low ferritin.  Reached out to Eileen Stanford with hematology.  Patient possibly has malabsorption of iron.  She recommended oral iron for 8 weeks and repeating ferritin level.  Discussed this with patient who is very comfortable with the plan.  She is going to be started on Accufur and order for ferritin level placed with future lab order.  Appointment for ferritin level with the lab placed in mid May.  Questions answered.

## 2024-02-23 DIAGNOSIS — F419 Anxiety disorder, unspecified: Secondary | ICD-10-CM | POA: Diagnosis not present

## 2024-02-28 DIAGNOSIS — E8889 Other specified metabolic disorders: Secondary | ICD-10-CM | POA: Diagnosis not present

## 2024-03-08 DIAGNOSIS — F419 Anxiety disorder, unspecified: Secondary | ICD-10-CM | POA: Diagnosis not present

## 2024-03-10 DIAGNOSIS — F411 Generalized anxiety disorder: Secondary | ICD-10-CM | POA: Diagnosis not present

## 2024-03-10 DIAGNOSIS — Z6832 Body mass index (BMI) 32.0-32.9, adult: Secondary | ICD-10-CM | POA: Diagnosis not present

## 2024-03-10 DIAGNOSIS — Z79899 Other long term (current) drug therapy: Secondary | ICD-10-CM | POA: Diagnosis not present

## 2024-03-10 DIAGNOSIS — E66811 Obesity, class 1: Secondary | ICD-10-CM | POA: Diagnosis not present

## 2024-03-10 DIAGNOSIS — E559 Vitamin D deficiency, unspecified: Secondary | ICD-10-CM | POA: Diagnosis not present

## 2024-03-14 ENCOUNTER — Other Ambulatory Visit (HOSPITAL_COMMUNITY): Payer: Self-pay

## 2024-03-14 MED ORDER — DESVENLAFAXINE SUCCINATE ER 50 MG PO TB24
50.0000 mg | ORAL_TABLET | Freq: Every day | ORAL | 1 refills | Status: DC
  Filled 2024-03-14: qty 30, 30d supply, fill #0

## 2024-03-27 DIAGNOSIS — E559 Vitamin D deficiency, unspecified: Secondary | ICD-10-CM | POA: Diagnosis not present

## 2024-03-27 DIAGNOSIS — R5383 Other fatigue: Secondary | ICD-10-CM | POA: Diagnosis not present

## 2024-03-29 DIAGNOSIS — F419 Anxiety disorder, unspecified: Secondary | ICD-10-CM | POA: Diagnosis not present

## 2024-03-30 NOTE — Progress Notes (Unsigned)
    Ben Jackson D.[facility] [address] [place] [address] Phone: [phone]   Assessment and Plan:     There are no diagnoses linked to this encounter.  ***   Pertinent previous records reviewed include ***    Follow Up: ***     Subjective:   I, Bronte Kropf, am serving as a Neurosurgeon for Doctor Ulysees Gander  Chief Complaint: shoulder and upper trap pain   HPI:   03/31/2024 Patient is a 48 year old female with shoulder and upper trap pain. Patient states   Relevant Historical Information: ***  Additional pertinent review of systems negative.   Current Outpatient Medications:    aspirin  EC 81 MG tablet, Take 1 tablet (81 mg total) by mouth daily. Swallow whole., Disp: 30 tablet, Rfl: 12   desvenlafaxine  (PRISTIQ ) 50 MG 24 hr tablet, Take 1 tablet (50 mg total) by mouth daily., Disp: 30 tablet, Rfl: 1   estradiol  (DOTTI ) 0.025 MG/24HR, Place 1 patch onto the skin 2 (two) times a week., Disp: 24 patch, Rfl: 3   Ferric Maltol  (ACCRUFER ) 30 MG CAPS, Take 1 capsule (30 mg total) by mouth daily., Disp: 30 capsule, Rfl: 12   hydrochlorothiazide  (HYDRODIURIL ) 12.5 MG tablet, Take 1 tablet (12.5 mg total) by mouth every other day as needed., Disp: 90 tablet, Rfl: 1   Lisdexamfetamine Dimesylate  (VYVANSE ) 20 MG CHEW, Chew 1 tablet (20 mg total) by mouth in the morning., Disp: 30 tablet, Rfl: 0   Multiple Vitamin (MULTIVITAMIN WITH MINERALS) TABS tablet, Take 1 tablet by mouth daily., Disp: , Rfl:    NONFORMULARY OR COMPOUNDED ITEM, Topical testosterone  cream 1mg /0.58ml.  Apply topically 0.05ml topically three times weekly.  Disp: 3 month supply.  #1RF., Disp: , Rfl:    ondansetron  (ZOFRAN ) 4 MG tablet, Take 1 tablet (4 mg total) by mouth every 6 (six) hours as needed for nausea and vomiting, Disp: 24 tablet, Rfl: 1   tirzepatide  (ZEPBOUND ) 15 MG/0.5ML Pen, Inject 15 mg into the skin once a week., Disp: 2 mL, Rfl: 1   tirzepatide   (ZEPBOUND ) 15 MG/0.5ML Pen, Inject 15 mg into the skin once a week., Disp: 2 mL, Rfl: 1   tirzepatide  (ZEPBOUND ) 2.5 MG/0.5ML Pen, Inject 2.5 mg into the skin once a week., Disp: 2 mL, Rfl: 0   traZODone  (DESYREL ) 50 MG tablet, Take 0.5-1 tablets (25-50 mg total) by mouth at bedtime as needed., Disp: 30 tablet, Rfl: 3   Ubrogepant  (UBRELVY ) 100 MG TABS, Take 100 mg by mouth daily as needed (migraine)., Disp: 30 tablet, Rfl: 0   Ubrogepant  (UBRELVY ) 100 MG TABS, Take 1 tablet (100 mg total) by mouth, may repeat in 2 hours after first dose as needed., Disp: 30 tablet, Rfl: 1   Objective:     There were no vitals filed for this visit.    There is no height or weight on file to calculate BMI.    Physical Exam:    ***   Electronically signed by:  Marshall Skeeter D.[facility] 7:50 AM 03/30/24

## 2024-03-31 ENCOUNTER — Ambulatory Visit: Admitting: Sports Medicine

## 2024-03-31 VITALS — BP 92/70 | HR 89 | Ht 62.0 in | Wt 183.0 lb

## 2024-03-31 DIAGNOSIS — M25511 Pain in right shoulder: Secondary | ICD-10-CM | POA: Diagnosis not present

## 2024-03-31 DIAGNOSIS — M898X1 Other specified disorders of bone, shoulder: Secondary | ICD-10-CM

## 2024-03-31 DIAGNOSIS — G8929 Other chronic pain: Secondary | ICD-10-CM

## 2024-03-31 MED ORDER — MELOXICAM 15 MG PO TABS: 15.0000 mg | ORAL_TABLET | Freq: Every day | ORAL | 0 refills | Status: DC

## 2024-03-31 NOTE — Patient Instructions (Signed)
-   Start meloxicam 15 mg daily x2 weeks.  If still having pain after 2 weeks, complete 3rd-week of NSAID. May use remaining NSAID as needed once daily for pain control.  Do not to use additional over-the-counter NSAIDs (ibuprofen , naproxen, Advil , Aleve, etc.) while taking prescription NSAIDs.  May use Tylenol  [phone] mg 2 to 3 times a day for breakthrough pain.  Hep. Keep your appointment with Dr. Felipe Horton.

## 2024-04-03 DIAGNOSIS — E782 Mixed hyperlipidemia: Secondary | ICD-10-CM | POA: Diagnosis not present

## 2024-04-03 DIAGNOSIS — E559 Vitamin D deficiency, unspecified: Secondary | ICD-10-CM | POA: Diagnosis not present

## 2024-04-03 DIAGNOSIS — J309 Allergic rhinitis, unspecified: Secondary | ICD-10-CM | POA: Diagnosis not present

## 2024-04-03 DIAGNOSIS — Z789 Other specified health status: Secondary | ICD-10-CM | POA: Diagnosis not present

## 2024-04-13 NOTE — Progress Notes (Unsigned)
 [name] [address] [place] [address] Phone: [phone] Subjective:   IBryan Caprio, am serving as a scribe for Dr. Ronnell Coins.  I'm seeing this patient by the request  of:  Aleta Anda, MD  CC: Right shoulder pain  [id number]  [name] is a 48 y.o. female coming in with complaint of R shoulder pain. Meloxicam  helped, but still has a knot on the R side. Can't really sleep on R side, will wake her at night. Saw Dr. Cleora Daft within the last 2 weeks.       Past Medical History:  Diagnosis Date   Anemia    Anxiety    Attention deficit hyperactivity disorder (ADHD) 02/16/2017   Sees [facility] for managment   Back pain    Calculus of kidney 06/07/2012   Overview:  Dr Ottelin - urology    Cancer [facility])    melanoma removed from abdomen in 2000   Complication of anesthesia    low BP during C-section   Depression    Factor V Leiden carrier [facility])    heterozygous   Hernia, rectovaginal 06/07/2012   Overview:  Dr Asencion Blacksmith - gyn    History of abnormal cervical Pap smear 1997   dysplasia- LEEP   History of reconstructive repair of rectocele    IBS (irritable bowel syndrome)    Joint pain    Lower extremity edema    Migraine    Ovarian cyst 1994   Psoriasis 02/16/2017   -sees dermatologist   Psoriatic arthritis [facility])    Past Surgical History:  Procedure Laterality Date   BLADDER SUSPENSION N/A 04/08/2020   Procedure: POSSIBLE TRANSVAGINAL TAPE (TVT) PROCEDURE MIDURETHAL SLING;  Surgeon: Greta Leatherwood, MD;  Location: [facility];  Service: Gynecology;  Laterality: N/A;   CERVICAL BIOPSY  W/ LOOP ELECTRODE EXCISION     early 20's   CESAREAN SECTION  2007 & 2010   CYSTOSCOPY N/A 04/08/2020   Procedure: CYSTOSCOPY;  Surgeon: Greta Leatherwood, MD;  Location: [facility];  Service: Gynecology;  Laterality: N/A;   KNEE ARTHROPLASTY Right 1989    OVARIAN CYST SURGERY  1994   RECTOCELE REPAIR N/A 04/08/2020   Procedure: POSTERIOR REPAIR (RECTOCELE) WITH NATIVE TISSUE REPAIR;  Surgeon: Greta Leatherwood, MD;  Location[facility];  Service: Gynecology;  Laterality: N/A;   SINUS ENDO WITH FUSION     TOTAL LAPAROSCOPIC HYSTERECTOMY WITH SALPINGECTOMY N/A 04/08/2020   Procedure: TOTAL LAPAROSCOPIC HYSTERECTOMY WITH BILATERAL SALPINGECTOMY;  Surgeon: Lillian Rein, MD;  Location: [facility];  Service: Gynecology;  Laterality: N/A;   TUBAL LIGATION  2010   VULVA /PERINEUM BIOPSY  01/2015   Social History   Socioeconomic History   Marital status: Married    Spouse name: Not on file   Number of children: Not on file   Years of education: Not on file   Highest education level: Bachelor's degree (e.g., BA, AB, BS)  Occupational History   Occupation: stay at home spouse  Tobacco Use   Smoking status: Never   Smokeless tobacco: Never  Vaping Use   Vaping status: Never Used  Substance and Sexual Activity   Alcohol use: Not Currently    Alcohol/week: 1.0 standard drink of alcohol    Types: 1 Glasses of wine per week    Comment: socially   Drug use: No   Sexual activity:  Yes    Partners: Male    Birth control/protection: Surgical    Comment: Hysterectomy  Other Topics Concern   Not on file  Social History Narrative   Grew up in [place], Dad worked for [employer] and is retired, did maintenance on buses. Mom worked in Designer, multimedia, then [employer].    Husband of 21 years is Psychologist, occupational. She and husband and 2 sons, 70 and 63 yo.      Works at [employer] at her church 2 days a week, just started after the first of the year.    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      Spiritual Beliefs: Christian   Caffeine-2 coffee, occas   Legal none   Social Drivers of Health   Financial Resource Strain: Low Risk  (12/31/2020)   Overall  Financial Resource Strain (CARDIA)    Difficulty of Paying Living Expenses: Not hard at all  Food Insecurity: No Food Insecurity (12/31/2020)   Hunger Vital Sign    Worried About Running Out of Food in the Last Year: Never true    Ran Out of Food in the Last Year: Never true  Transportation Needs: No Transportation Needs (12/31/2020)   PRAPARE - Administrator, Civil Service (Medical): No    Lack of Transportation (Non-Medical): No  Physical Activity: Inactive (12/31/2020)   Exercise Vital Sign    Days of Exercise per Week: 0 days    Minutes of Exercise per Session: 0 min  Stress: Stress Concern Present (12/31/2020)   [employer] of Occupational Health - Occupational Stress Questionnaire    Feeling of Stress : To some extent  Social Connections: Moderately Integrated (12/31/2020)   Social Connection and Isolation Panel [NHANES]    Frequency of Communication with Friends and Family: More than three times a week    Frequency of Social Gatherings with Friends and Family: More than three times a week    Attends Religious Services: More than 4 times per year    Active Member of [employer] or Organizations: Yes    Attends Banker Meetings: More than 4 times per year    Marital Status: Widowed   No Known Allergies Family History  Problem Relation Age of Onset   Migraines Mother    Hyperlipidemia Mother    Hyperlipidemia Father    Cancer Father        throat   Diabetes Maternal Grandmother    Heart disease Maternal Grandmother    Emphysema Maternal Grandfather    Healthy Paternal Grandmother    Emphysema Paternal Grandfather    Healthy Son    Healthy Son    Breast cancer Maternal Aunt 76       stage 1   Esophageal cancer Maternal Uncle    Rectal cancer Maternal Uncle     Current Outpatient Medications (Endocrine & Metabolic):    estradiol  (DOTTI ) 0.025 MG/24HR, Place 1 patch onto the skin 2 (two) times a week.  Current Outpatient Medications  (Cardiovascular):    hydrochlorothiazide  (HYDRODIURIL ) 12.5 MG tablet, Take 1 tablet (12.5 mg total) by mouth every other day as needed.   Current Outpatient Medications (Analgesics):    aspirin  EC 81 MG tablet, Take 1 tablet (81 mg total) by mouth daily. Swallow whole.   meloxicam  (MOBIC ) 15 MG tablet, Take 1 tablet (15 mg total) by mouth daily.   Ubrogepant  (UBRELVY ) 100 MG  TABS, Take 100 mg by mouth daily as needed (migraine).   Ubrogepant  (UBRELVY ) 100 MG TABS, Take 1 tablet (100 mg total) by mouth, may repeat in 2 hours after first dose as needed.  Current Outpatient Medications (Hematological):    Ferric Maltol  (ACCRUFER ) 30 MG CAPS, Take 1 capsule (30 mg total) by mouth daily.  Current Outpatient Medications (Other):    desvenlafaxine  (PRISTIQ ) 50 MG 24 hr tablet, Take 1 tablet (50 mg total) by mouth daily.   Lisdexamfetamine Dimesylate  (VYVANSE ) 20 MG CHEW, Chew 1 tablet (20 mg total) by mouth in the morning.   Multiple Vitamin (MULTIVITAMIN WITH MINERALS) TABS tablet, Take 1 tablet by mouth daily.   NONFORMULARY OR COMPOUNDED ITEM, Topical testosterone  cream 1mg /0.43ml.  Apply topically 0.05ml topically three times weekly.  Disp: 3 month supply.  #1RF.   ondansetron  (ZOFRAN ) 4 MG tablet, Take 1 tablet (4 mg total) by mouth every 6 (six) hours as needed for nausea and vomiting   tirzepatide  (ZEPBOUND ) 15 MG/0.5ML Pen, Inject 15 mg into the skin once a week.   tirzepatide  (ZEPBOUND ) 15 MG/0.5ML Pen, Inject 15 mg into the skin once a week.   tirzepatide  (ZEPBOUND ) 2.5 MG/0.5ML Pen, Inject 2.5 mg into the skin once a week.   traZODone  (DESYREL ) 50 MG tablet, Take 0.5-1 tablets (25-50 mg total) by mouth at bedtime as needed.   Reviewed prior external information including notes and imaging from  primary care provider As well as notes that were available from care everywhere and other healthcare systems.  Past medical history, social, surgical and family history all reviewed in  electronic medical record.  No pertanent information unless stated regarding to the chief complaint.   Review of Systems:  No headache, visual changes, nausea, vomiting, diarrhea, constipation, dizziness, abdominal pain, skin rash, fevers, chills, night sweats, weight loss, swollen lymph nodes, body aches, joint swelling, chest pain, shortness of breath, mood changes. POSITIVE muscle aches  Objective  Last menstrual period 03/20/2020.   General: No apparent distress alert and oriented x3 mood and affect normal, dressed appropriately.  HEENT: Pupils equal, extraocular movements intact  Respiratory: Patient's speak in full sentences and does not appear short of breath  Cardiovascular: No lower extremity edema, non tender, no erythema  Right shoulder exam shows  Limited muscular skeletal ultrasound was performed and interpreted by Ronnell Coins, M      Impression and Recommendations:    The above documentation has been reviewed and is accurate and complete Baran Kuhrt M Celeste Tavenner, DO

## 2024-04-17 ENCOUNTER — Other Ambulatory Visit (HOSPITAL_COMMUNITY): Payer: Self-pay | Admitting: Family Medicine

## 2024-04-17 ENCOUNTER — Other Ambulatory Visit (HOSPITAL_BASED_OUTPATIENT_CLINIC_OR_DEPARTMENT_OTHER): Payer: Self-pay

## 2024-04-17 DIAGNOSIS — R790 Abnormal level of blood mineral: Secondary | ICD-10-CM

## 2024-04-17 NOTE — Progress Notes (Signed)
 Patient came in for blood draw. tbw

## 2024-04-18 ENCOUNTER — Ambulatory Visit (INDEPENDENT_AMBULATORY_CARE_PROVIDER_SITE_OTHER)

## 2024-04-18 ENCOUNTER — Other Ambulatory Visit: Payer: Self-pay

## 2024-04-18 ENCOUNTER — Other Ambulatory Visit: Payer: Self-pay | Admitting: Family Medicine

## 2024-04-18 ENCOUNTER — Ambulatory Visit: Admitting: Family Medicine

## 2024-04-18 VITALS — BP 114/70 | HR 75 | Ht 62.0 in | Wt 180.0 lb

## 2024-04-18 DIAGNOSIS — M9908 Segmental and somatic dysfunction of rib cage: Secondary | ICD-10-CM | POA: Diagnosis not present

## 2024-04-18 DIAGNOSIS — G8929 Other chronic pain: Secondary | ICD-10-CM | POA: Diagnosis not present

## 2024-04-18 DIAGNOSIS — M1711 Unilateral primary osteoarthritis, right knee: Secondary | ICD-10-CM

## 2024-04-18 DIAGNOSIS — M9901 Segmental and somatic dysfunction of cervical region: Secondary | ICD-10-CM | POA: Diagnosis not present

## 2024-04-18 DIAGNOSIS — M25511 Pain in right shoulder: Secondary | ICD-10-CM

## 2024-04-18 DIAGNOSIS — M9902 Segmental and somatic dysfunction of thoracic region: Secondary | ICD-10-CM

## 2024-04-18 DIAGNOSIS — Z1231 Encounter for screening mammogram for malignant neoplasm of breast: Secondary | ICD-10-CM

## 2024-04-18 DIAGNOSIS — M25562 Pain in left knee: Secondary | ICD-10-CM | POA: Diagnosis not present

## 2024-04-18 DIAGNOSIS — M25561 Pain in right knee: Secondary | ICD-10-CM | POA: Diagnosis not present

## 2024-04-18 DIAGNOSIS — M170 Bilateral primary osteoarthritis of knee: Secondary | ICD-10-CM | POA: Diagnosis not present

## 2024-04-18 LAB — FERRITIN: Ferritin: 33 ng/mL (ref 15–150)

## 2024-04-18 NOTE — Patient Instructions (Addendum)
 Xrays today Do prescribed exercises at least 3x a week See you again in 5-6 weeks

## 2024-04-19 DIAGNOSIS — M25511 Pain in right shoulder: Secondary | ICD-10-CM | POA: Insufficient documentation

## 2024-04-19 DIAGNOSIS — F419 Anxiety disorder, unspecified: Secondary | ICD-10-CM | POA: Diagnosis not present

## 2024-04-19 NOTE — Assessment & Plan Note (Signed)
 Trigger points noted today.  Responded extremely well to trigger point injections as well as osteopathic manipulation.  Has been getting dry needling which I think is another good modality for this individual.  Discussed posture and ergonomics as well as scapular exercises.  Discussed icing regimen.  Follow-up again in 6 to 8 weeks

## 2024-04-19 NOTE — Assessment & Plan Note (Signed)
 Fairly severe overall.  Could consider the possibility of custom bracing as well as injections with viscosupplementation or PRP.  Patient will consider this.  X-rays pending.

## 2024-04-23 ENCOUNTER — Ambulatory Visit: Payer: Self-pay | Admitting: Family Medicine

## 2024-04-25 DIAGNOSIS — F908 Attention-deficit hyperactivity disorder, other type: Secondary | ICD-10-CM | POA: Diagnosis not present

## 2024-04-25 DIAGNOSIS — F411 Generalized anxiety disorder: Secondary | ICD-10-CM | POA: Diagnosis not present

## 2024-04-25 DIAGNOSIS — N951 Menopausal and female climacteric states: Secondary | ICD-10-CM | POA: Diagnosis not present

## 2024-04-25 DIAGNOSIS — R632 Polyphagia: Secondary | ICD-10-CM | POA: Diagnosis not present

## 2024-04-28 ENCOUNTER — Ambulatory Visit
Admission: RE | Admit: 2024-04-28 | Discharge: 2024-04-28 | Disposition: A | Discharge status: Home / Self Care | Source: Ambulatory Visit | Attending: Family Medicine | Admitting: Family Medicine

## 2024-04-28 DIAGNOSIS — Z1231 Encounter for screening mammogram for malignant neoplasm of breast: Secondary | ICD-10-CM

## 2024-05-04 DIAGNOSIS — F419 Anxiety disorder, unspecified: Secondary | ICD-10-CM | POA: Diagnosis not present

## 2024-05-08 ENCOUNTER — Ambulatory Visit (HOSPITAL_BASED_OUTPATIENT_CLINIC_OR_DEPARTMENT_OTHER): Payer: Self-pay | Admitting: Obstetrics & Gynecology

## 2024-05-24 ENCOUNTER — Other Ambulatory Visit (HOSPITAL_COMMUNITY): Payer: Self-pay

## 2024-05-24 ENCOUNTER — Other Ambulatory Visit (HOSPITAL_BASED_OUTPATIENT_CLINIC_OR_DEPARTMENT_OTHER): Payer: Self-pay

## 2024-05-24 DIAGNOSIS — F419 Anxiety disorder, unspecified: Secondary | ICD-10-CM | POA: Diagnosis not present

## 2024-05-24 MED ORDER — LISDEXAMFETAMINE DIMESYLATE 20 MG PO CHEW
20.0000 mg | CHEWABLE_TABLET | Freq: Every day | ORAL | 0 refills | Status: DC
  Filled 2024-05-24: qty 30, 30d supply, fill #0

## 2024-05-25 ENCOUNTER — Other Ambulatory Visit: Payer: Self-pay

## 2024-05-26 NOTE — Progress Notes (Unsigned)
  [name] [address] [place] [address] Phone: [phone] Subjective:    I'm seeing this patient by the request  of:  Lillian Rein, MD  CC:   [id number]  [name] is a 48 y.o. female coming in with complaint of back and neck pain. OMT on 04/18/2024. Patient states   Medications patient has been prescribed:   Taking:         Reviewed prior external information including notes and imaging from previsou exam, outside providers and external EMR if available.   As well as notes that were available from care everywhere and other healthcare systems.  Past medical history, social, surgical and family history all reviewed in electronic medical record.  No pertanent information unless stated regarding to the chief complaint.   Past Medical History:  Diagnosis Date   Anemia    Anxiety    Attention deficit hyperactivity disorder (ADHD) 02/16/2017   Sees [facility] for managment   Back pain    Calculus of kidney 06/07/2012   Overview:  Dr Ottelin - urology    Cancer [facility])    melanoma removed from abdomen in 2000   Complication of anesthesia    low BP during C-section   Depression    Factor V Leiden carrier [facility])    heterozygous   Hernia, rectovaginal 06/07/2012   Overview:  Dr Asencion Blacksmith - gyn    History of abnormal cervical Pap smear 1997   dysplasia- LEEP   History of reconstructive repair of rectocele    IBS (irritable bowel syndrome)    Joint pain    Lower extremity edema    Migraine    Ovarian cyst 1994   Psoriasis 02/16/2017   -sees dermatologist   Psoriatic arthritis (HCC)     No Known Allergies   Review of Systems:  No headache, visual changes, nausea, vomiting, diarrhea, constipation, dizziness, abdominal pain, skin rash, fevers, chills, night sweats, weight loss, swollen lymph nodes, body aches, joint swelling, chest pain, shortness of breath, mood changes. POSITIVE muscle  aches  Objective  Last menstrual period 03/20/2020.   General: No apparent distress alert and oriented x3 mood and affect normal, dressed appropriately.  HEENT: Pupils equal, extraocular movements intact  Respiratory: Patient's speak in full sentences and does not appear short of breath  Cardiovascular: No lower extremity edema, non tender, no erythema  Gait MSK:  Back   Osteopathic findings  C2 flexed rotated and side bent right C6 flexed rotated and side bent left T3 extended rotated and side bent right inhaled rib T9 extended rotated and side bent left L2 flexed rotated and side bent right Sacrum right on right       Assessment and Plan:  No problem-specific Assessment & Plan notes found for this encounter.    Nonallopathic problems  Decision today to treat with OMT was based on Physical Exam  After verbal consent patient was treated with HVLA, ME, FPR techniques in cervical, rib, thoracic, lumbar, and sacral  areas  Patient tolerated the procedure well with improvement in symptoms  Patient given exercises, stretches and lifestyle modifications  See medications in patient instructions if given  Patient will follow up in 4-8 weeks             Note: This dictation was prepared with Dragon dictation along with smaller phrase technology. Any transcriptional errors that result from this process are unintentional.

## 2024-05-31 ENCOUNTER — Ambulatory Visit (INDEPENDENT_AMBULATORY_CARE_PROVIDER_SITE_OTHER)

## 2024-05-31 ENCOUNTER — Ambulatory Visit (INDEPENDENT_AMBULATORY_CARE_PROVIDER_SITE_OTHER): Admitting: Family Medicine

## 2024-05-31 ENCOUNTER — Encounter: Payer: Self-pay | Admitting: Family Medicine

## 2024-05-31 VITALS — BP 102/60 | HR 86 | Ht 62.0 in | Wt 168.0 lb

## 2024-05-31 DIAGNOSIS — G2589 Other specified extrapyramidal and movement disorders: Secondary | ICD-10-CM | POA: Diagnosis not present

## 2024-05-31 DIAGNOSIS — M9902 Segmental and somatic dysfunction of thoracic region: Secondary | ICD-10-CM | POA: Diagnosis not present

## 2024-05-31 DIAGNOSIS — M9903 Segmental and somatic dysfunction of lumbar region: Secondary | ICD-10-CM

## 2024-05-31 DIAGNOSIS — M546 Pain in thoracic spine: Secondary | ICD-10-CM

## 2024-05-31 DIAGNOSIS — M542 Cervicalgia: Secondary | ICD-10-CM

## 2024-05-31 DIAGNOSIS — M47812 Spondylosis without myelopathy or radiculopathy, cervical region: Secondary | ICD-10-CM | POA: Diagnosis not present

## 2024-05-31 DIAGNOSIS — M25511 Pain in right shoulder: Secondary | ICD-10-CM

## 2024-05-31 DIAGNOSIS — M9904 Segmental and somatic dysfunction of sacral region: Secondary | ICD-10-CM | POA: Diagnosis not present

## 2024-05-31 DIAGNOSIS — M9901 Segmental and somatic dysfunction of cervical region: Secondary | ICD-10-CM

## 2024-05-31 DIAGNOSIS — M47814 Spondylosis without myelopathy or radiculopathy, thoracic region: Secondary | ICD-10-CM | POA: Diagnosis not present

## 2024-05-31 DIAGNOSIS — M9908 Segmental and somatic dysfunction of rib cage: Secondary | ICD-10-CM | POA: Diagnosis not present

## 2024-05-31 DIAGNOSIS — M19011 Primary osteoarthritis, right shoulder: Secondary | ICD-10-CM | POA: Diagnosis not present

## 2024-05-31 MED ORDER — GABAPENTIN 100 MG PO CAPS: 200.0000 mg | ORAL_CAPSULE | Freq: Every day | ORAL | 0 refills | Status: DC

## 2024-05-31 NOTE — Assessment & Plan Note (Signed)
 New problem causing underlying difficulty.  Discussed that if worsening pain again we could potentially consider advanced imaging but do not think it would be necessary.  Discussed icing regimen at home exercises, icing regimen, increase activity slowly.  Follow-up again in 6 to 8 weeks.  Tightness with Deri right greater than left.  Will continue to monitor.  Concerned that there is a possibility of some cervical radiculopathy and gabapentin  200 mg given.

## 2024-05-31 NOTE — Patient Instructions (Addendum)
 Xray today Gabapentin  200mg  at night Keep working on exercises See me in 8-10 weeks

## 2024-06-05 ENCOUNTER — Other Ambulatory Visit (HOSPITAL_COMMUNITY): Payer: Self-pay

## 2024-06-05 DIAGNOSIS — F411 Generalized anxiety disorder: Secondary | ICD-10-CM | POA: Diagnosis not present

## 2024-06-05 DIAGNOSIS — N951 Menopausal and female climacteric states: Secondary | ICD-10-CM | POA: Diagnosis not present

## 2024-06-05 DIAGNOSIS — F908 Attention-deficit hyperactivity disorder, other type: Secondary | ICD-10-CM | POA: Diagnosis not present

## 2024-06-05 DIAGNOSIS — R632 Polyphagia: Secondary | ICD-10-CM | POA: Diagnosis not present

## 2024-06-05 MED ORDER — LISDEXAMFETAMINE DIMESYLATE 20 MG PO CHEW
20.0000 mg | CHEWABLE_TABLET | Freq: Every morning | ORAL | 0 refills | Status: DC
  Filled 2024-07-26: qty 30, 30d supply, fill #0

## 2024-06-05 MED ORDER — LISDEXAMFETAMINE DIMESYLATE 20 MG PO CHEW: 20.0000 mg | CHEWABLE_TABLET | Freq: Every morning | ORAL | 0 refills | Status: DC

## 2024-06-05 MED ORDER — LISDEXAMFETAMINE DIMESYLATE 20 MG PO CHEW
20.0000 mg | CHEWABLE_TABLET | Freq: Every morning | ORAL | 0 refills | Status: DC
  Filled 2024-09-04: qty 4, 4d supply, fill #0
  Filled 2024-09-05: qty 26, 26d supply, fill #0

## 2024-06-07 ENCOUNTER — Ambulatory Visit (INDEPENDENT_AMBULATORY_CARE_PROVIDER_SITE_OTHER): Admitting: Orthopaedic Surgery

## 2024-06-07 ENCOUNTER — Other Ambulatory Visit (INDEPENDENT_AMBULATORY_CARE_PROVIDER_SITE_OTHER): Payer: Self-pay

## 2024-06-07 VITALS — Ht 62.0 in | Wt 168.0 lb

## 2024-06-07 DIAGNOSIS — M25561 Pain in right knee: Secondary | ICD-10-CM

## 2024-06-07 DIAGNOSIS — G8929 Other chronic pain: Secondary | ICD-10-CM

## 2024-06-07 DIAGNOSIS — M25551 Pain in right hip: Secondary | ICD-10-CM

## 2024-06-07 NOTE — Progress Notes (Signed)
 Patient is very pleasant and active 48 year old female who comes in for opinion as a relates to her right knee and right hip.  She has remote history of a very young age having patellar realignment surgery.  Over time she has developed some patellofemoral setting changes.  She had an MRI of her knee in 2021 and she showed me that MRI report did show a loose body and significant patellofemoral arthritic changes and normal-appearing meniscus on both knees.  She is regular patient of also Dr. Zack Smith.  She has had injections of hyaluronic acid in her right knee but also injections over the IT band and trochanteric area of her right hip.  Her right hip causes her a lot of pain and it may be coming from walking different as it relates to favoring her knee.  She has never had arthroscopic surgery on the right knee.  On my exam today her right hip moves smoothly and fluidly with no blocks to rotation and no pain in the groin at all.  Her pain is over the lateral aspect of her hip and this is at the IT band and proximal troches area.  This is causing a popping sensation as well.  Examination of her right knee shows no effusion.  There is grinding at patellofemoral joint and there is definitely a clicking sensation and grinding of the IT band of the lateral aspect of the lateral femoral condyle.  An AP pelvis and lateral the right hip shows normal-appearing hips bilaterally.  A lateral view and AP view of the right knee shows profound patellofemoral arthritis but the medial lateral compartments are well-maintained.  I did talk to her about trying a diagnostic and therapeutic injection of a steroid over the lateral femoral condyle in this area of the IT band as her right knee and she agreed to this and tolerated well.  Based on her clinical exam findings and previous MRI, I would recommend an arthroscopic intervention for this knee as opposed to any type of knee arthroplasty.  With that being said, we will see her  back in 2 weeks for repeat exam to see how she is down with a steroid injection.  She totally agrees with this treatment plan.

## 2024-06-11 ENCOUNTER — Ambulatory Visit: Payer: Self-pay | Admitting: Family Medicine

## 2024-07-03 ENCOUNTER — Ambulatory Visit (INDEPENDENT_AMBULATORY_CARE_PROVIDER_SITE_OTHER): Admitting: Orthopaedic Surgery

## 2024-07-03 ENCOUNTER — Encounter: Payer: Self-pay | Admitting: Orthopaedic Surgery

## 2024-07-03 DIAGNOSIS — M25561 Pain in right knee: Secondary | ICD-10-CM | POA: Diagnosis not present

## 2024-07-03 DIAGNOSIS — G8929 Other chronic pain: Secondary | ICD-10-CM | POA: Diagnosis not present

## 2024-07-03 NOTE — Progress Notes (Signed)
 The patient is well-known to us .  She has significant patellofemoral arthritis and is also a patient of Dr. Zack Smith sports medicine with [facility].  When I saw her at her last visit we did place a steroid injection around the lateral femoral condyle where she is very symptomatic and she said that is really helped her quite a bit.  She still has a lot of patellofemoral crepitation but it is calm things down.  She also has some chronic IT band pain and mid lateral thigh pain of the IT band.  She is walking better and the knee feels better overall.  The patella does not really track laterally from her previous patella realignment surgery but there is profound patellofemoral grinding.  Our next recommendation for her would be considering an arthroscopic intervention with removal of the probably a symptomatic lateral plica as well as any osteophyte around the lateral femoral condyle that is causing her problems.  The medial and lateral compartments are well-maintained within her knee.  Certainly patellofemoral replacement may be warranted at some point.  She is only 49 years old.  She is feeling much better and knows that she can wait 3 to 4 months between steroid injections.  She is going to think about things and send so far she is stable she will let us  know if things worsen and we will go from there.  All questions and concerns were addressed and answered.

## 2024-07-05 ENCOUNTER — Other Ambulatory Visit: Payer: Self-pay | Admitting: Sports Medicine

## 2024-07-05 DIAGNOSIS — G8929 Other chronic pain: Secondary | ICD-10-CM

## 2024-07-11 DIAGNOSIS — F419 Anxiety disorder, unspecified: Secondary | ICD-10-CM | POA: Diagnosis not present

## 2024-07-26 ENCOUNTER — Other Ambulatory Visit (HOSPITAL_COMMUNITY): Payer: Self-pay

## 2024-07-26 ENCOUNTER — Other Ambulatory Visit: Payer: Self-pay

## 2024-07-27 NOTE — Progress Notes (Unsigned)
 [facility] [address] [place] [address] Phone: [phone] Subjective:   [name], am serving as a scribe for Dr. Arthea Claudene.  I'm seeing this patient by the request  of:  Autumn Ronal RAMAN, MD  CC: Back and neck pain  YEP:Dlagzrupcz  Autumn [name] is a 48 y.o. female coming in with complaint of back and neck pain. OMT 05/31/2024. Patient states recheck on pain in R shoulder, neck region. Also having some pain lateral R thigh. Was told IT band pain. Gabapentin  does help.  Medications patient has been prescribed: Gabapentin   Taking:         Reviewed prior external information including notes and imaging from previsou exam, outside providers and external EMR if available.   As well as notes that were available from care everywhere and other healthcare systems.  Past medical history, social, surgical and family history all reviewed in electronic medical record.  No pertanent information unless stated regarding to the chief complaint.   Past Medical History:  Diagnosis Date   Anemia    Anxiety    Attention deficit hyperactivity disorder (ADHD) 02/16/2017   Sees [facility] for managment   Back pain    Calculus of kidney 06/07/2012   Overview:  Dr Ottelin - urology    Cancer [facility])    melanoma removed from abdomen in 2000   Complication of anesthesia    low BP during C-section   Depression    Factor V Leiden carrier [facility])    heterozygous   Hernia, rectovaginal 06/07/2012   Overview:  Dr Marget - gyn    History of abnormal cervical Pap smear 1997   dysplasia- LEEP   History of reconstructive repair of rectocele    IBS (irritable bowel syndrome)    Joint pain    Lower extremity edema    Migraine    Ovarian cyst 1994   Psoriasis 02/16/2017   -sees dermatologist   Psoriatic arthritis (HCC)     No Known Allergies   Review of Systems:  No headache, visual changes, nausea, vomiting, diarrhea,  constipation, dizziness, abdominal pain, skin rash, fevers, chills, night sweats, weight loss, swollen lymph nodes, body aches, joint swelling, chest pain, shortness of breath, mood changes. POSITIVE muscle aches  Objective  Blood pressure 110/76, pulse 78, height 5' 2 (1.575 m), weight 170 lb (77.1 kg), last menstrual period 03/20/2020, SpO2 96%.   General: No apparent distress alert and oriented x3 mood and affect normal, dressed appropriately.  HEENT: Pupils equal, extraocular movements intact  Respiratory: Patient's speak in full sentences and does not appear short of breath  Cardiovascular: No lower extremity edema, non tender, no erythema  Gait MSK:  Back like loss of lordosis noted.  Severe tenderness over the right greater trochanteric area than anywhere else.  No swelling noted over the bursa sac as well.  I  Osteopathic findings  C3 flexed rotated and side bent right C7 I flexed rotated and side bent left T3 extended rotated and side bent right inhaled rib T8 extended rotated and side bent left L2 flexed rotated and side bent right Sacrum right on right   After verbal consent patient was prepped with alcohol swab and with a 21-gauge 2 inch needle injected into the right greater trochanteric area with 2 cc of 0.5% Marcaine  and 1 cc of Kenalog  40 mg/mL.  No blood loss.  Band-Aid placed.  Postinjection instructions given   Assessment and Plan:  Greater  trochanteric bursitis of right hip New problem.  Given injection, tolerated procedure well, discussed with patient about icing regimen.  Differential includes referred lumbar radiculopathy.  Worsening pain extension would be necessary with some advanced imaging.    Nonallopathic problems  Decision today to treat with OMT was based on Physical Exam  After verbal consent patient was treated with HVLA, ME, FPR techniques in cervical, rib, thoracic, lumbar, and sacral  areas  Patient tolerated the procedure well with  improvement in symptoms  Patient given exercises, stretches and lifestyle modifications  See medications in patient instructions if given  Patient will follow up in 4-8 weeks    The above documentation has been reviewed and is accurate and complete Delaine Canter M Horton Ellithorpe, DO          Note: This dictation was prepared with Dragon dictation along with smaller phrase technology. Any transcriptional errors that result from this process are unintentional.

## 2024-07-28 ENCOUNTER — Ambulatory Visit: Admitting: Family Medicine

## 2024-07-28 ENCOUNTER — Encounter: Payer: Self-pay | Admitting: Family Medicine

## 2024-07-28 VITALS — BP 110/76 | HR 78 | Ht 62.0 in | Wt 170.0 lb

## 2024-07-28 DIAGNOSIS — M9904 Segmental and somatic dysfunction of sacral region: Secondary | ICD-10-CM

## 2024-07-28 DIAGNOSIS — M9901 Segmental and somatic dysfunction of cervical region: Secondary | ICD-10-CM | POA: Diagnosis not present

## 2024-07-28 DIAGNOSIS — M9903 Segmental and somatic dysfunction of lumbar region: Secondary | ICD-10-CM | POA: Diagnosis not present

## 2024-07-28 DIAGNOSIS — M9908 Segmental and somatic dysfunction of rib cage: Secondary | ICD-10-CM

## 2024-07-28 DIAGNOSIS — M9902 Segmental and somatic dysfunction of thoracic region: Secondary | ICD-10-CM

## 2024-07-28 DIAGNOSIS — M7061 Trochanteric bursitis, right hip: Secondary | ICD-10-CM

## 2024-07-28 NOTE — Patient Instructions (Addendum)
 Injection in hip today See me again in 8 weeks

## 2024-07-28 NOTE — Assessment & Plan Note (Signed)
 New problem.  Given injection, tolerated procedure well, discussed with patient about icing regimen.  Differential includes referred lumbar radiculopathy.  Worsening pain extension would be necessary with some advanced imaging.

## 2024-08-02 DIAGNOSIS — F419 Anxiety disorder, unspecified: Secondary | ICD-10-CM | POA: Diagnosis not present

## 2024-08-22 DIAGNOSIS — F419 Anxiety disorder, unspecified: Secondary | ICD-10-CM | POA: Diagnosis not present

## 2024-09-01 DIAGNOSIS — H9201 Otalgia, right ear: Secondary | ICD-10-CM | POA: Diagnosis not present

## 2024-09-05 ENCOUNTER — Other Ambulatory Visit: Payer: Self-pay

## 2024-09-05 ENCOUNTER — Other Ambulatory Visit (HOSPITAL_COMMUNITY): Payer: Self-pay

## 2024-09-06 NOTE — Progress Notes (Unsigned)
 [name] [address] [place] [address] Phone: [phone] Subjective:   Autumn [name], am serving as a scribe for Dr. Arthea Autumn.  I'Autumn [name] seeing this patient by the request  of:  Cleotilde Ronal RAMAN, MD  CC: Hip pain follow-up  YEP:Dlagzrupcz  Autumn [name] is a 48 y.o. female coming in with complaint of hip pain, has had greater trochanteric bursitis but has responded very well to osteopathic manipulation over the past years. Recheck lipoma. Hip and knee pain R side.      Past Medical History:  Diagnosis Date   Anemia    Anxiety    Attention deficit hyperactivity disorder (ADHD) 02/16/2017   Sees [facility] for managment   Back pain    Calculus of kidney 06/07/2012   Overview:  Dr Ottelin - urology    Cancer [facility])    melanoma removed from abdomen in 2000   Complication of anesthesia    low BP during C-section   Depression    Factor V Leiden carrier    heterozygous   Hernia, rectovaginal 06/07/2012   Overview:  Dr Marget - gyn    History of abnormal cervical Pap smear 1997   dysplasia- LEEP   History of reconstructive repair of rectocele    IBS (irritable bowel syndrome)    Joint pain    Lower extremity edema    Migraine    Ovarian cyst 1994   Psoriasis 02/16/2017   -sees dermatologist   Psoriatic arthritis [facility])    Past Surgical History:  Procedure Laterality Date   BLADDER SUSPENSION N/A 04/08/2020   Procedure: POSSIBLE TRANSVAGINAL TAPE (TVT) PROCEDURE MIDURETHAL SLING;  Surgeon: Cathlyn JAYSON Nikki Bobie FORBES, MD;  Location: [facility];  Service: Gynecology;  Laterality: N/A;   CERVICAL BIOPSY  W/ LOOP ELECTRODE EXCISION     early 20's   CESAREAN SECTION  2007 & 2010   CYSTOSCOPY N/A 04/08/2020   Procedure: CYSTOSCOPY;  Surgeon: Cathlyn JAYSON Nikki Bobie FORBES, MD;  Location: [facility];  Service: Gynecology;  Laterality: N/A;   KNEE ARTHROPLASTY Right 1989   OVARIAN CYST  SURGERY  1994   RECTOCELE REPAIR N/A 04/08/2020   Procedure: POSTERIOR REPAIR (RECTOCELE) WITH NATIVE TISSUE REPAIR;  Surgeon: Cathlyn JAYSON Nikki Bobie FORBES, MD;  Location: [facility];  Service: Gynecology;  Laterality: N/A;   SINUS ENDO WITH FUSION     TOTAL LAPAROSCOPIC HYSTERECTOMY WITH SALPINGECTOMY N/A 04/08/2020   Procedure: TOTAL LAPAROSCOPIC HYSTERECTOMY WITH BILATERAL SALPINGECTOMY;  Surgeon: Cleotilde Ronal RAMAN, MD;  Location: [facility];  Service: Gynecology;  Laterality: N/A;   TUBAL LIGATION  2010   VULVA /PERINEUM BIOPSY  01/2015   Social History   Socioeconomic History   Marital status: Married    Spouse name: Not on file   Number of children: Not on file   Years of education: Not on file   Highest education level: Bachelor's degree (e.g., BA, AB, BS)  Occupational History   Occupation: stay at home spouse  Tobacco Use   Smoking status: Never   Smokeless tobacco: Never  Vaping Use   Vaping status: Never Used  Substance and Sexual Activity   Alcohol use: Not Currently    Alcohol/week: 1.0 standard drink of alcohol    Types: 1 Glasses of wine per week    Comment: socially   Drug use: No   Sexual activity: Yes    Partners: Male  Birth control/protection: Surgical    Comment: Hysterectomy  Other Topics Concern   Not on file  Social History Narrative   Grew up in [place], Dad worked for [employer] and is retired, did maintenance on buses. Mom worked in Designer, multimedia, then [employer].    Husband of 21 years is Psychologist, occupational. She and husband and 2 sons, 6 and 41 yo.      Works at [employer] at her church 2 days a week, just started after the first of the year.    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      Spiritual Beliefs: Christian   Caffeine-2 coffee, occas   Legal none   Social Drivers of Health   Financial Resource Strain: Low Risk  (12/31/2020)   Overall Financial  Resource Strain (CARDIA)    Difficulty of Paying Living Expenses: Not hard at all  Food Insecurity: No Food Insecurity (12/31/2020)   Hunger Vital Sign    Worried About Running Out of Food in the Last Year: Never true    Ran Out of Food in the Last Year: Never true  Transportation Needs: No Transportation Needs (12/31/2020)   PRAPARE - Administrator, Civil Service (Medical): No    Lack of Transportation (Non-Medical): No  Physical Activity: Inactive (12/31/2020)   Exercise Vital Sign    Days of Exercise per Week: 0 days    Minutes of Exercise per Session: 0 min  Stress: Stress Concern Present (12/31/2020)   [employer] of Occupational Health - Occupational Stress Questionnaire    Feeling of Stress : To some extent  Social Connections: Moderately Integrated (12/31/2020)   Social Connection and Isolation Panel    Frequency of Communication with Friends and Family: More than three times a week    Frequency of Social Gatherings with Friends and Family: More than three times a week    Attends Religious Services: More than 4 times per year    Active Member of [employer] or Organizations: Yes    Attends Banker Meetings: More than 4 times per year    Marital Status: Widowed   No Known Allergies Family History  Problem Relation Age of Onset   Migraines Mother    Hyperlipidemia Mother    Hyperlipidemia Father    Throat cancer Father    Breast cancer Maternal Aunt 41       stage 1   Esophageal cancer Maternal Uncle    Rectal cancer Maternal Uncle    Diabetes Maternal Grandmother    Heart disease Maternal Grandmother    Emphysema Maternal Grandfather    Healthy Paternal Grandmother    Emphysema Paternal Grandfather    Healthy Son    Healthy Son     Current Outpatient Medications (Endocrine & Metabolic):    estradiol  (DOTTI ) 0.025 MG/24HR, Place 1 patch onto the skin 2 (two) times a week.  Current Outpatient Medications (Cardiovascular):     hydrochlorothiazide  (HYDRODIURIL ) 12.5 MG tablet, Take 1 tablet (12.5 mg total) by mouth every other day as needed.   Current Outpatient Medications (Analgesics):    aspirin  EC 81 MG tablet, Take 1 tablet (81 mg total) by mouth daily. Swallow whole.   meloxicam  (MOBIC ) 15 MG tablet, Take 1 tablet (15 mg total) by mouth daily as needed for pain.   Ubrogepant  (UBRELVY ) 100 MG TABS, Take 100 mg by mouth daily as needed (migraine).  Ubrogepant  (UBRELVY ) 100 MG TABS, Take 1 tablet (100 mg total) by mouth, may repeat in 2 hours after first dose as needed.  Current Outpatient Medications (Hematological):    Ferric Maltol  (ACCRUFER ) 30 MG CAPS, Take 1 capsule (30 mg total) by mouth daily.  Current Outpatient Medications (Other):    desvenlafaxine  (PRISTIQ ) 50 MG 24 hr tablet, Take 1 tablet (50 mg total) by mouth daily.   gabapentin  (NEURONTIN ) 100 MG capsule, Take 2 capsules (200 mg total) by mouth at bedtime.   Lisdexamfetamine Dimesylate  (VYVANSE ) 20 MG CHEW, Chew 1 tablet (20 mg total) by mouth in the morning.   Lisdexamfetamine Dimesylate  (VYVANSE ) 20 MG CHEW, Chew 1 tablet (20 mg total) by mouth every morning. (07-20-24)   Lisdexamfetamine Dimesylate  (VYVANSE ) 20 MG CHEW, Chew 1 tablet (20 mg total) by mouth every morning.   Lisdexamfetamine Dimesylate  (VYVANSE ) 20 MG CHEW, Chew 1 tablet (20 mg total) by mouth every morning. (06-21-24)   Multiple Vitamin (MULTIVITAMIN WITH MINERALS) TABS tablet, Take 1 tablet by mouth daily.   NONFORMULARY OR COMPOUNDED ITEM, Topical testosterone  cream 1mg /0.40ml.  Apply topically 0.05ml topically three times weekly.  Disp: 3 month supply.  #1RF.   ondansetron  (ZOFRAN ) 4 MG tablet, Take 1 tablet (4 mg total) by mouth every 6 (six) hours as needed for nausea and vomiting   tirzepatide  (ZEPBOUND ) 15 MG/0.5ML Pen, Inject 15 mg into the skin once a week.   tirzepatide  (ZEPBOUND ) 15 MG/0.5ML Pen, Inject 15 mg into the skin once a week.   tirzepatide  (ZEPBOUND ) 2.5  MG/0.5ML Pen, Inject 2.5 mg into the skin once a week.   traZODone  (DESYREL ) 50 MG tablet, Take 0.5-1 tablets (25-50 mg total) by mouth at bedtime as needed.   Reviewed prior external information including notes and imaging from  primary care provider As well as notes that were available from care everywhere and other healthcare systems.  Past medical history, social, surgical and family history all reviewed in electronic medical record.  No pertanent information unless stated regarding to the chief complaint.   Review of Systems:  No headache, visual changes, nausea, vomiting, diarrhea, constipation, dizziness, abdominal pain, skin rash, fevers, chills, night sweats, weight loss, swollen lymph nodes, body aches, joint swelling, chest pain, shortness of breath, mood changes. POSITIVE muscle aches  Objective  Blood pressure 118/74, pulse 81, height 5' 2 (1.575 Autumn [name]), weight 166 lb (75.3 kg), last menstrual period 03/20/2020, SpO2 99%.   General: No apparent distress alert and oriented x3 mood and affect normal, dressed appropriately.  HEENT: Pupils equal, extraocular movements intact  Respiratory: Patient's speak in full sentences and does not appear short of breath  Cardiovascular: No lower extremity edema, non tender, no erythema  Right shoulder does have some tenderness noted.  Does have what appears to be a lipoma which is a freely movable tender spot on the posterior aspect of the right shoulder questionably right above the spinous process Patient's right hip does have an audible pop with any internal range of motion noted today.  Some limited external range of motion.  Severely tender to palpation over the tensor fascia lata and the greater trochanteric area.  Tightness with straight leg test noted.  Antalgic gait noted.  Limited muscular skeletal ultrasound was performed and interpreted by Autumn [name], Autumn [name]  Limited ultrasound shows some heterotopic changes in the fatty tissue that is  consistent with a potential lipoma.  Measures 1 cm x 1.1 cm.  No significant vascularity noted.  No communication with the underlying  muscle   Impression and Recommendations:    The above documentation has been reviewed and is accurate and complete Autumn Ahmad Autumn [name] Young Mulvey, DO

## 2024-09-07 ENCOUNTER — Encounter: Payer: Self-pay | Admitting: Family Medicine

## 2024-09-07 ENCOUNTER — Other Ambulatory Visit: Payer: Self-pay

## 2024-09-07 ENCOUNTER — Ambulatory Visit: Admitting: Family Medicine

## 2024-09-07 VITALS — BP 118/74 | HR 81 | Ht 62.0 in | Wt 166.0 lb

## 2024-09-07 DIAGNOSIS — C4359 Malignant melanoma of other part of trunk: Secondary | ICD-10-CM | POA: Diagnosis not present

## 2024-09-07 DIAGNOSIS — D1721 Benign lipomatous neoplasm of skin and subcutaneous tissue of right arm: Secondary | ICD-10-CM | POA: Diagnosis not present

## 2024-09-07 DIAGNOSIS — M7061 Trochanteric bursitis, right hip: Secondary | ICD-10-CM | POA: Diagnosis not present

## 2024-09-07 NOTE — Assessment & Plan Note (Addendum)
 Patient has had greater trochanteric bursitis but unfortunately it only short-lived improvement with some of the injection.  I am very concerned with this and now patient describing more instability of the hip.  Patient states that the pain is waking her up at night, failed formal physical therapy, home exercises, icing regimen, at this point I do think advanced imaging is warranted with this taking multiple years.  Past medical history significant for malignant melanoma as well but no significant signs of any true breakdown.  X-ray showed only very mild arthritic changes noted.  Will get MRI to further evaluate.  Depending on findings we will discuss if continued injections versus any type of surgical intervention is necessary.

## 2024-09-07 NOTE — Patient Instructions (Addendum)
 Referral to General Surgery [facility] [phone] Call Today  When we receive your results we will contact you.  Continue to consider PRP See you again in 2 months

## 2024-09-07 NOTE — Assessment & Plan Note (Signed)
 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  It does not have any type of communication to the underlying musculature.  Measuring approximately 1 cm x 1.1 cm.  No abnormal vascularity.  Patient does have a past medical history that was significant for melanoma on her stomach.  Is followed by dermatologist but would like to refer patient to general surgery to discuss potential removal.

## 2024-09-12 DIAGNOSIS — F419 Anxiety disorder, unspecified: Secondary | ICD-10-CM | POA: Diagnosis not present

## 2024-09-14 ENCOUNTER — Other Ambulatory Visit (HOSPITAL_COMMUNITY): Payer: Self-pay

## 2024-09-14 ENCOUNTER — Telehealth: Admitting: Physician Assistant

## 2024-09-14 DIAGNOSIS — J208 Acute bronchitis due to other specified organisms: Secondary | ICD-10-CM | POA: Diagnosis not present

## 2024-09-14 MED ORDER — PROMETHAZINE-DM 6.25-15 MG/5ML PO SYRP
5.0000 mL | ORAL_SOLUTION | Freq: Four times a day (QID) | ORAL | 0 refills | Status: AC | PRN
  Filled 2024-09-14: qty 118, 6d supply, fill #0

## 2024-09-14 NOTE — Progress Notes (Signed)
 We are sorry that you are not feeling well.  Here is how we plan to help!  Based on your presentation I believe you most likely have A cough due to a virus.  This is called viral bronchitis and is best treated by rest, plenty of fluids and control of the cough.  You may use Ibuprofen  or Tylenol  as directed to help your symptoms.     I have prescribed a promethazine -DM cough syrup to use as directed to calm the cough down.  From your responses in the eVisit questionnaire you describe inflammation in the upper respiratory tract which is causing a significant cough.  This is commonly called Bronchitis and has four common causes:   Allergies Viral Infections Acid Reflux Bacterial Infection Allergies, viruses and acid reflux are treated by controlling symptoms or eliminating the cause. An example might be a cough caused by taking certain blood pressure medications. You stop the cough by changing the medication. Another example might be a cough caused by acid reflux. Controlling the reflux helps control the cough.  USE OF BRONCHODILATOR (RESCUE) INHALERS: There is a risk from using your bronchodilator too frequently.  The risk is that over-reliance on a medication which only relaxes the muscles surrounding the breathing tubes can reduce the effectiveness of medications prescribed to reduce swelling and congestion of the tubes themselves.  Although you feel brief relief from the bronchodilator inhaler, your asthma may actually be worsening with the tubes becoming more swollen and filled with mucus.  This can delay other crucial treatments, such as oral steroid medications. If you need to use a bronchodilator inhaler daily, several times per day, you should discuss this with your provider.  There are probably better treatments that could be used to keep your asthma under control.     HOME CARE Only take medications as instructed by your medical team. Complete the entire course of an antibiotic. Drink  plenty of fluids and get plenty of rest. Avoid close contacts especially the very young and the elderly Cover your mouth if you cough or cough into your sleeve. Always remember to wash your hands A steam or ultrasonic humidifier can help congestion.   GET HELP RIGHT AWAY IF: You develop worsening fever. You become short of breath You cough up blood. Your symptoms persist after you have completed your treatment plan MAKE SURE YOU  Understand these instructions. Will watch your condition. Will get help right away if you are not doing well or get worse.  Your e-visit answers were reviewed by a board certified advanced clinical practitioner to complete your personal care plan.  Depending on the condition, your plan could have included both over the counter or prescription medications. If there is a problem please reply  once you have received a response from your provider. Your safety is important to us .  If you have drug allergies check your prescription carefully.    You can use MyChart to ask questions about today's visit, request a non-urgent call back, or ask for a work or school excuse for 24 hours related to this e-Visit. If it has been greater than 24 hours you will need to follow up with your provider, or enter a new e-Visit to address those concerns. You will get an e-mail in the next two days asking about your experience.  I hope that your e-visit has been valuable and will speed your recovery. Thank you for using e-visits.   I have spent 5 minutes in review of e-visit questionnaire,  review and updating patient chart, medical decision making and response to patient.   Elsie Velma Lunger, PA-C

## 2024-09-22 ENCOUNTER — Ambulatory Visit: Admitting: Family Medicine

## 2024-09-25 ENCOUNTER — Ambulatory Visit
Admission: RE | Admit: 2024-09-25 | Discharge: 2024-09-25 | Disposition: A | Source: Ambulatory Visit | Attending: Family Medicine | Admitting: Family Medicine

## 2024-09-25 DIAGNOSIS — M7061 Trochanteric bursitis, right hip: Secondary | ICD-10-CM

## 2024-09-26 ENCOUNTER — Other Ambulatory Visit (HOSPITAL_COMMUNITY): Payer: Self-pay

## 2024-09-26 MED ORDER — TRAZODONE HCL 50 MG PO TABS
ORAL_TABLET | ORAL | 0 refills | Status: DC
  Filled 2024-09-26: qty 30, 30d supply, fill #0

## 2024-09-27 ENCOUNTER — Ambulatory Visit: Payer: Self-pay | Admitting: Family Medicine

## 2024-10-04 DIAGNOSIS — Z85828 Personal history of other malignant neoplasm of skin: Secondary | ICD-10-CM | POA: Diagnosis not present

## 2024-10-04 DIAGNOSIS — Z85820 Personal history of malignant melanoma of skin: Secondary | ICD-10-CM | POA: Diagnosis not present

## 2024-10-04 DIAGNOSIS — L400 Psoriasis vulgaris: Secondary | ICD-10-CM | POA: Diagnosis not present

## 2024-10-04 DIAGNOSIS — F419 Anxiety disorder, unspecified: Secondary | ICD-10-CM | POA: Diagnosis not present

## 2024-10-04 DIAGNOSIS — L718 Other rosacea: Secondary | ICD-10-CM | POA: Diagnosis not present

## 2024-10-09 ENCOUNTER — Encounter: Payer: Self-pay | Admitting: Radiology

## 2024-10-20 ENCOUNTER — Ambulatory Visit (HOSPITAL_BASED_OUTPATIENT_CLINIC_OR_DEPARTMENT_OTHER): Payer: BC Managed Care – PPO | Admitting: Certified Nurse Midwife

## 2024-10-23 ENCOUNTER — Ambulatory Visit (HOSPITAL_BASED_OUTPATIENT_CLINIC_OR_DEPARTMENT_OTHER): Admitting: Obstetrics & Gynecology

## 2024-10-24 DIAGNOSIS — F419 Anxiety disorder, unspecified: Secondary | ICD-10-CM | POA: Diagnosis not present

## 2024-10-26 ENCOUNTER — Telehealth: Payer: Self-pay | Admitting: Family Medicine

## 2024-10-26 ENCOUNTER — Ambulatory Visit (INDEPENDENT_AMBULATORY_CARE_PROVIDER_SITE_OTHER): Admitting: Family Medicine

## 2024-10-26 VITALS — BP 102/60 | HR 79 | Ht 61.0 in | Wt 163.2 lb

## 2024-10-26 DIAGNOSIS — E559 Vitamin D deficiency, unspecified: Secondary | ICD-10-CM

## 2024-10-26 DIAGNOSIS — R790 Abnormal level of blood mineral: Secondary | ICD-10-CM | POA: Diagnosis not present

## 2024-10-26 DIAGNOSIS — E66811 Obesity, class 1: Secondary | ICD-10-CM

## 2024-10-26 DIAGNOSIS — Z90710 Acquired absence of both cervix and uterus: Secondary | ICD-10-CM

## 2024-10-26 DIAGNOSIS — R632 Polyphagia: Secondary | ICD-10-CM

## 2024-10-26 DIAGNOSIS — Z6830 Body mass index (BMI) 30.0-30.9, adult: Secondary | ICD-10-CM

## 2024-10-26 DIAGNOSIS — H16202 Unspecified keratoconjunctivitis, left eye: Secondary | ICD-10-CM | POA: Diagnosis not present

## 2024-10-26 DIAGNOSIS — R7301 Impaired fasting glucose: Secondary | ICD-10-CM

## 2024-10-26 DIAGNOSIS — F900 Attention-deficit hyperactivity disorder, predominantly inattentive type: Secondary | ICD-10-CM

## 2024-10-26 DIAGNOSIS — Z23 Encounter for immunization: Secondary | ICD-10-CM

## 2024-10-26 DIAGNOSIS — Z79899 Other long term (current) drug therapy: Secondary | ICD-10-CM

## 2024-10-26 DIAGNOSIS — G43009 Migraine without aura, not intractable, without status migrainosus: Secondary | ICD-10-CM

## 2024-10-26 DIAGNOSIS — N951 Menopausal and female climacteric states: Secondary | ICD-10-CM

## 2024-10-26 NOTE — Telephone Encounter (Signed)
 Dr Prentiss, please enter future orders for pts labs. Or I can do that for you as well. Just let me know what labs and what dx codes. Thank you

## 2024-10-26 NOTE — Telephone Encounter (Signed)
 Pt stated she always does labs a week ahead of her CPE. I made that appointment and did not check for orders. Please add those orders or I can call and let her know. Yvonne

## 2024-10-27 NOTE — Progress Notes (Unsigned)
 [name] [address] [place] [address] Phone: [phone] Subjective:   Autumn [name], am serving as a scribe for Dr. Arthea Claudene.  I'm seeing this patient by the request  of:  Prentiss Frieze, DO  CC: Right hip and shoulder pain  YEP:Dlagzrupcz  09/07/2024 Patient has had greater trochanteric bursitis but unfortunately it only short-lived improvement with some of the injection.  I am very concerned with this and now patient describing more instability of the hip.  Patient states that the pain is waking her up at night, failed formal physical therapy, home exercises, icing regimen, at this point I do think advanced imaging is warranted with this taking multiple years.  Past medical history significant for malignant melanoma as well but no significant signs of any true breakdown.  X-ray showed only very mild arthritic changes noted.  Will get MRI to further evaluate.  Depending on findings we will discuss if continued injections versus any type of surgical intervention is necessary.     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  It does not have any type of communication to the underlying musculature.  Measuring approximately 1 cm x 1.1 cm.  No abnormal vascularity.  Patient does have a past medical history that was significant for melanoma on her stomach.  Is followed by dermatologist but would like to refer patient to general surgery to discuss potential removal.     Updated 11/01/2024 Autumn [name] is a 48 y.o. female coming in with complaint of R hip and shoulder pain, was found to have on MRI mild tearing of the gluteal tendon. Shoulder is less painful. Lipoma size has decreased. Some tenderness in posterior shoulder.   R hip is better than last visit. Would like to get injection. Painful to sit in figure 4 position. Walking does not produce pain.        Past Medical History:  Diagnosis Date   Anemia    Anxiety    Attention deficit  hyperactivity disorder (ADHD) 02/16/2017   Sees [facility] for managment   Back pain    Calculus of kidney 06/07/2012   Overview:  Dr Ottelin - urology    Cancer [facility]) 2000   melanoma removed from abdomen in 2000   Complication of anesthesia    low BP during C-section   Depression    Factor V Leiden carrier    heterozygous   Hernia, rectovaginal 06/07/2012   Overview:  Dr Marget - gyn    History of abnormal cervical Pap smear 1997   dysplasia- LEEP   History of reconstructive repair of rectocele    IBS (irritable bowel syndrome)    Joint pain    Lower extremity edema    Migraine    Ovarian cyst 1994   Psoriasis 02/16/2017   -sees dermatologist   Psoriatic arthritis (HCC)    Skin cancer 2000   Melanoma, removed with clear margins   Past Surgical History:  Procedure Laterality Date   ABDOMINAL HYSTERECTOMY  04/08/2020   BLADDER SUSPENSION N/A 04/08/2020   Procedure: POSSIBLE TRANSVAGINAL TAPE (TVT) PROCEDURE MIDURETHAL SLING;  Surgeon: Cathlyn JAYSON Nikki Bobie FORBES, MD;  Location: [facility];  Service: Gynecology;  Laterality: N/A;   CERVICAL BIOPSY  W/ LOOP ELECTRODE EXCISION     early 20's   CESAREAN SECTION  2007 & 2010   CYSTOSCOPY N/A 04/08/2020   Procedure: CYSTOSCOPY;  Surgeon: Cathlyn JAYSON Nikki Bobie FORBES, MD;  Location: [facility];  Service: Gynecology;  Laterality: N/A;   KNEE ARTHROPLASTY Right 1989   OVARIAN CYST SURGERY  1994   RECTOCELE REPAIR N/A 04/08/2020   Procedure: POSTERIOR REPAIR (RECTOCELE) WITH NATIVE TISSUE REPAIR;  Surgeon: Cathlyn JAYSON Nikki Bobie FORBES, MD;  Location: [facility];  Service: Gynecology;  Laterality: N/A;   SINUS ENDO WITH FUSION     TOTAL LAPAROSCOPIC HYSTERECTOMY WITH SALPINGECTOMY N/A 04/08/2020   Procedure: TOTAL LAPAROSCOPIC HYSTERECTOMY WITH BILATERAL SALPINGECTOMY;  Surgeon: Cleotilde Ronal RAMAN, MD;  Location: [facility];  Service: Gynecology;  Laterality: N/A;    TUBAL LIGATION  2010   VULVA /PERINEUM BIOPSY  01/2015   Social History   Socioeconomic History   Marital status: Married    Spouse name: Not on file   Number of children: Not on file   Years of education: Not on file   Highest education level: Bachelor's degree (e.g., BA, AB, BS)  Occupational History   Occupation: stay at home spouse  Tobacco Use   Smoking status: Never   Smokeless tobacco: Never  Vaping Use   Vaping status: Never Used  Substance and Sexual Activity   Alcohol use: Not Currently    Alcohol/week: 1.0 standard drink of alcohol    Types: 1 Glasses of wine per week    Comment: socially   Drug use: No   Sexual activity: Yes    Partners: Male    Birth control/protection: Surgical    Comment: Hysterectomy  Other Topics Concern   Not on file  Social History Narrative   Grew up in [place], Dad worked for [employer] and is retired, did maintenance on buses. Mom worked in Designer, multimedia, then [employer].    Husband of 21 years is psychologist, occupational. She and husband and 2 sons, 7 and 43 yo.      Works at [employer] at her church 2 days a week, just started after the first of the year.    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      Spiritual Beliefs: Christian   Caffeine-2 coffee, occas   Legal none   Social Drivers of Health   Financial Resource Strain: Low Risk  (10/26/2024)   Overall Financial Resource Strain (CARDIA)    Difficulty of Paying Living Expenses: Not hard at all  Food Insecurity: No Food Insecurity (10/26/2024)   Hunger Vital Sign    Worried About Running Out of Food in the Last Year: Never true    Ran Out of Food in the Last Year: Never true  Transportation Needs: No Transportation Needs (10/26/2024)   PRAPARE - Administrator, Civil Service (Medical): No    Lack of Transportation (Non-Medical): No  Physical Activity: Insufficiently Active (10/26/2024)   Exercise Vital Sign     Days of Exercise per Week: 1 day    Minutes of Exercise per Session: 30 min  Stress: Stress Concern Present (10/26/2024)   [employer] of Occupational Health - Occupational Stress Questionnaire    Feeling of Stress: To some extent  Social Connections: Socially Integrated (10/26/2024)   Social Connection and Isolation Panel    Frequency of Communication with Friends and Family: More than three times a week    Frequency of Social Gatherings with Friends and Family: More than three times a week    Attends Religious Services: More than 4 times per year  Active Member of Clubs or Organizations: Yes    Attends Engineer, Structural: More than 4 times per year    Marital Status: Married   No Known Allergies Family History  Problem Relation Age of Onset   Migraines Mother    Hyperlipidemia Mother    Hyperlipidemia Father    Throat cancer Father    Cancer Father    Breast cancer Maternal Aunt 76       stage 1   Cancer Maternal Aunt    Esophageal cancer Maternal Uncle    Rectal cancer Maternal Uncle    Diabetes Maternal Grandmother    Heart disease Maternal Grandmother    Emphysema Maternal Grandfather    Arthritis Paternal Grandmother    Emphysema Paternal Grandfather    Healthy Son    Healthy Son    Cancer Paternal Aunt     Current Outpatient Medications (Endocrine & Metabolic):    estradiol  (DOTTI ) 0.025 MG/24HR, Place 1 patch onto the skin 2 (two) times a week.   Current Outpatient Medications (Respiratory):    benzonatate (TESSALON) 200 MG capsule,    promethazine -dextromethorphan (PROMETHAZINE -DM) 6.25-15 MG/5ML syrup, Take 5 mLs by mouth 4 (four) times daily as needed for cough.  Current Outpatient Medications (Analgesics):    meloxicam  (MOBIC ) 15 MG tablet, Take 1 tablet (15 mg total) by mouth daily as needed for pain.   Ubrogepant  (UBRELVY ) 100 MG TABS, Take 1 tablet (100 mg total) by mouth daily as needed (migraine).  Current Outpatient Medications  (Hematological):    Ferric Maltol  (ACCRUFER ) 30 MG CAPS, Take 1 capsule (30 mg total) by mouth daily.  Current Outpatient Medications (Other):    clobetasol (TEMOVATE) 0.05 % external solution, SMARTSIG:sparingly As Directed   desvenlafaxine  (PRISTIQ ) 50 MG 24 hr tablet, Take 1 tablet (50 mg total) by mouth daily.   Lisdexamfetamine Dimesylate  (VYVANSE ) 20 MG CHEW, Chew 1 tablet (20 mg total) by mouth every morning.   metroNIDAZOLE  (METROCREAM ) 0.75 % cream, SMARTSIG:sparingly Topical Daily   Multiple Vitamin (MULTIVITAMIN WITH MINERALS) TABS tablet, Take 1 tablet by mouth daily.   ondansetron  (ZOFRAN ) 4 MG tablet, Take 1 tablet (4 mg total) by mouth every 6 (six) hours as needed for nausea and vomiting   tirzepatide  (ZEPBOUND ) 15 MG/0.5ML Pen, Inject 15 mg into the skin once a week.   traZODone  (DESYREL ) 50 MG tablet, Take 25 mg by mouth.   Reviewed prior external information including notes and imaging from  primary care provider As well as notes that were available from care everywhere and other healthcare systems.  Past medical history, social, surgical and family history all reviewed in electronic medical record.  No pertanent information unless stated regarding to the chief complaint.   Review of Systems:  No headache, visual changes, nausea, vomiting, diarrhea, constipation, dizziness, abdominal pain, skin rash, fevers, chills, night sweats, weight loss, swollen lymph nodes, body aches, joint swelling, chest pain, shortness of breath, mood changes. POSITIVE muscle aches  Objective  Blood pressure 106/62, pulse 66, height 5' 1 (1.549 m), weight 164 lb (74.4 kg), last menstrual period 03/20/2020, SpO2 99%.   General: No apparent distress alert and oriented x3 mood and affect normal, dressed appropriately.  HEENT: Pupils equal, extraocular movements intact  Respiratory: Patient's speak in full sentences and does not appear short of breath  Cardiovascular: No lower extremity edema,  non tender, no erythema   Low back exam shows mild loss of lordosis but nothing severe.  Positive FABER test noted on the right  side.  Tender to palpation over the gluteal tendon the most lateral aspect right at the insertion on the greater trochanteric area.  Procedure: Real-time Ultrasound Guided Injection of right gluteal tendon sheath Device: GE Logiq Q7 Ultrasound guided injection is preferred based studies that show increased duration, increased effect, greater accuracy, decreased procedural pain, increased response rate, and decreased cost with ultrasound guided versus blind injection.  Verbal informed consent obtained.  Time-out conducted.  Noted no overlying erythema, induration, or other signs of local infection.  Skin prepped in a sterile fashion.  Local anesthesia: Topical Ethyl chloride.  With sterile technique and under real time ultrasound guidance: With a 21-gauge 2 inch needle injected into the gluteal tendon sheath right at the insertion of the greater trochanteric area with a total of 0.5 cc of 0.5% Marcaine  and 1 cc of Kenalog  40 mg loss.  Band-Aid placed.  Postinjection instruction given. Completed without difficulty  Pain immediately resolved suggesting accurate placement of the medication.  Advised to call if fevers/chills, erythema, induration, drainage, or persistent bleeding.  Impression: Technically successful ultrasound guided injection.   Impression and Recommendations:    The above documentation has been reviewed and is accurate and complete Autumn Reek M Jassmin Kemmerer, DO

## 2024-10-30 ENCOUNTER — Other Ambulatory Visit (HOSPITAL_COMMUNITY): Payer: Self-pay

## 2024-10-30 MED ORDER — UBRELVY 100 MG PO TABS
100.0000 mg | ORAL_TABLET | Freq: Every day | ORAL | 0 refills | Status: DC | PRN
  Filled 2024-10-30 – 2024-11-15 (×2): qty 16, 30d supply, fill #0

## 2024-10-30 NOTE — Progress Notes (Unsigned)
 Assessment & Plan:   1. Migraine without aura and without status migrainosus, not intractable - Ubrogepant  (UBRELVY ) 100 MG TABS; Take 1 tablet (100 mg total) by mouth daily as needed (migraine).  Dispense: 30 tablet; Refill: 0  2. Immunization due (Primary) - Flu vaccine trivalent PF, 6mos and older(Flulaval,Afluria,Fluarix,Fluzone)  Geni Shutter, DO, MS, FAAFP, Dipl. [facility] [address] [place], [address] Dept: [phone] Dept Fax: [phone]  Subjective:   ***Patient is well known to me from my previous clinic and is now establishing care with me as their primary care provider.  All past medical history, surgical history, allergies, family history, immunizations andmedications were updated in the EMR today and reviewed under the history and medication portions of their EMR. Review of Systems: Negative, with the exception of above mentioned in HPI.  Current Outpatient Medications:    benzonatate (TESSALON) 200 MG capsule, , Disp: , Rfl:    clobetasol (TEMOVATE) 0.05 % external solution, SMARTSIG:sparingly As Directed, Disp: , Rfl:    estradiol  (DOTTI ) 0.025 MG/24HR, Place 1 patch onto the skin 2 (two) times a week., Disp: 24 patch, Rfl: 3   Ferric Maltol  (ACCRUFER ) 30 MG CAPS, Take 1 capsule (30 mg total) by mouth daily., Disp: 30 capsule, Rfl: 12   meloxicam  (MOBIC ) 15 MG tablet, Take 1 tablet (15 mg total) by mouth daily as needed for pain., Disp: 30 tablet, Rfl: 0   metroNIDAZOLE  (METROCREAM ) 0.75 % cream, SMARTSIG:sparingly Topical Daily, Disp: , Rfl:    Multiple Vitamin (MULTIVITAMIN WITH MINERALS) TABS tablet, Take 1 tablet by mouth daily., Disp: , Rfl:    ondansetron  (ZOFRAN ) 4 MG tablet, Take 1 tablet (4 mg total) by mouth every 6 (six) hours as needed for nausea and vomiting, Disp: 24 tablet, Rfl: 1   promethazine -dextromethorphan (PROMETHAZINE -DM) 6.25-15 MG/5ML syrup, Take 5 mLs by mouth 4 (four) times daily  as needed for cough., Disp: 118 mL, Rfl: 0   tirzepatide  (ZEPBOUND ) 15 MG/0.5ML Pen, Inject 15 mg into the skin once a week., Disp: 2 mL, Rfl: 1   traZODone  (DESYREL ) 50 MG tablet, Take 25 mg by mouth., Disp: , Rfl:    desvenlafaxine  (PRISTIQ ) 50 MG 24 hr tablet, Take 1 tablet (50 mg total) by mouth daily. (Patient not taking: Reported on 10/26/2024), Disp: 30 tablet, Rfl: 1   Lisdexamfetamine Dimesylate  (VYVANSE ) 20 MG CHEW, Chew 1 tablet (20 mg total) by mouth every morning. (Patient not taking: Reported on 10/26/2024), Disp: 30 tablet, Rfl: 0   Ubrogepant  (UBRELVY ) 100 MG TABS, Take 1 tablet (100 mg total) by mouth daily as needed (migraine)., Disp: 30 tablet, Rfl: 0   Objective:   BP 102/60 (BP Location: Right Arm, Cuff Size: Normal)   Pulse 79   Ht 5' 1 (1.549 m)   Wt 163 lb 3.2 oz (74 kg)   LMP 03/20/2020   SpO2 98%   BMI 30.84 kg/m   Wt Readings from Last 3 Encounters:  10/26/24 163 lb 3.2 oz (74 kg)  09/07/24 166 lb (75.3 kg)  07/28/24 170 lb (77.1 kg)   Physical Exam  Lab Results  Component Value Date   CREATININE 0.70 02/01/2024   BUN 10 02/01/2024   NA 141 02/01/2024   K 4.0 02/01/2024   CL 103 02/01/2024   CO2 27 02/01/2024   Lab Results  Component Value Date   ALT 20 02/01/2024   AST 20 02/01/2024   ALKPHOS 89 02/01/2024   BILITOT 0.3 02/01/2024  Lab Results  Component Value Date   TSH 1.890 02/01/2024   Lab Results  Component Value Date   CHOL 187 07/26/2021   HDL 55 07/26/2021   LDLCALC 119 07/26/2021   TRIG 67 07/26/2021   Lab Results  Component Value Date   VD25OH 38.0 09/29/2021   VD25OH 32.9 11/11/2020   VD25OH 30.64 08/24/2017   Lab Results  Component Value Date   WBC 7.4 02/01/2024   HGB 11.6 02/01/2024   HCT 36.5 02/01/2024   MCV 82 02/01/2024   PLT 244 02/01/2024   Lab Results  Component Value Date   IRON 32 02/01/2024   TIBC 307 02/01/2024   FERRITIN 33 04/17/2024

## 2024-10-31 ENCOUNTER — Other Ambulatory Visit (HOSPITAL_COMMUNITY): Payer: Self-pay

## 2024-11-01 ENCOUNTER — Other Ambulatory Visit: Payer: Self-pay

## 2024-11-01 ENCOUNTER — Telehealth (HOSPITAL_COMMUNITY): Payer: Self-pay

## 2024-11-01 ENCOUNTER — Encounter: Payer: Self-pay | Admitting: Family Medicine

## 2024-11-01 ENCOUNTER — Other Ambulatory Visit

## 2024-11-01 ENCOUNTER — Ambulatory Visit: Admitting: Family Medicine

## 2024-11-01 ENCOUNTER — Other Ambulatory Visit (HOSPITAL_COMMUNITY): Payer: Self-pay

## 2024-11-01 VITALS — BP 106/62 | HR 66 | Ht 61.0 in | Wt 164.0 lb

## 2024-11-01 DIAGNOSIS — M7061 Trochanteric bursitis, right hip: Secondary | ICD-10-CM

## 2024-11-01 DIAGNOSIS — M25551 Pain in right hip: Secondary | ICD-10-CM | POA: Diagnosis not present

## 2024-11-01 DIAGNOSIS — H16202 Unspecified keratoconjunctivitis, left eye: Secondary | ICD-10-CM | POA: Diagnosis not present

## 2024-11-01 NOTE — Patient Instructions (Addendum)
 Injection in glute See me again in 2-3 months

## 2024-11-01 NOTE — Assessment & Plan Note (Signed)
 MRI did show some mild tearing noted to the gluteal tendon with an injection in the greater trochanteric area as well as the gluteal tendon sheath.  Responded extremely well though, discussed icing regimen and home exercises, increase activity slowly.  Follow-up again in 6 to 12 weeks.  We did discuss the possibility of PRP if needed.  Hopefully patient does well with conservative therapy

## 2024-11-02 DIAGNOSIS — L4050 Arthropathic psoriasis, unspecified: Secondary | ICD-10-CM | POA: Insufficient documentation

## 2024-11-02 DIAGNOSIS — R632 Polyphagia: Secondary | ICD-10-CM | POA: Insufficient documentation

## 2024-11-02 DIAGNOSIS — Z800 Family history of malignant neoplasm of digestive organs: Secondary | ICD-10-CM | POA: Insufficient documentation

## 2024-11-02 DIAGNOSIS — F419 Anxiety disorder, unspecified: Secondary | ICD-10-CM | POA: Insufficient documentation

## 2024-11-03 ENCOUNTER — Telehealth (HOSPITAL_COMMUNITY): Payer: Self-pay

## 2024-11-03 ENCOUNTER — Other Ambulatory Visit (HOSPITAL_COMMUNITY): Payer: Self-pay

## 2024-11-03 NOTE — Telephone Encounter (Signed)
 PA request has been Received. New Encounter has been or will be created for follow up. For additional info see Pharmacy Prior Auth telephone encounter from 11/03/24.

## 2024-11-03 NOTE — Telephone Encounter (Signed)
 Pharmacy Patient Advocate Encounter   Received notification from Pt Calls Messages that prior authorization for Ubrelvy  100 mg tablets is required/requested.   Insurance verification completed.   The patient is insured through [facility].   *This med was last approved for 16 tablets every 27 days, this one is written for 30 tablets for a 30 day supply with directions of 1 tablet daily. I spoke with the pharmacist and she said it is not usually dosed daily. Can you please clarify so I know what quantity to use for the prior authorization? Thanks!

## 2024-11-05 ENCOUNTER — Other Ambulatory Visit (HOSPITAL_COMMUNITY): Payer: Self-pay

## 2024-11-06 ENCOUNTER — Encounter: Admitting: Family Medicine

## 2024-11-07 ENCOUNTER — Ambulatory Visit: Admitting: Family Medicine

## 2024-11-07 ENCOUNTER — Other Ambulatory Visit (HOSPITAL_BASED_OUTPATIENT_CLINIC_OR_DEPARTMENT_OTHER): Payer: Self-pay | Admitting: Obstetrics & Gynecology

## 2024-11-08 ENCOUNTER — Other Ambulatory Visit (HOSPITAL_COMMUNITY): Payer: Self-pay

## 2024-11-08 NOTE — Telephone Encounter (Signed)
 Pharmacy Patient Advocate Encounter   Received notification from Pt Calls Messages that prior authorization for Ubrelvy  100 mg tablets  is required/requested.   Insurance verification completed.   The patient is insured through [facility]   Per test claim: PA required; PA submitted to above mentioned insurance via Latent Key/confirmation #/EOC [facility] Status is pending

## 2024-11-14 ENCOUNTER — Other Ambulatory Visit

## 2024-11-15 ENCOUNTER — Encounter (HOSPITAL_BASED_OUTPATIENT_CLINIC_OR_DEPARTMENT_OTHER): Payer: Self-pay | Admitting: Obstetrics & Gynecology

## 2024-11-15 ENCOUNTER — Other Ambulatory Visit (HOSPITAL_BASED_OUTPATIENT_CLINIC_OR_DEPARTMENT_OTHER): Payer: Self-pay

## 2024-11-15 ENCOUNTER — Other Ambulatory Visit (HOSPITAL_COMMUNITY): Payer: Self-pay

## 2024-11-15 ENCOUNTER — Ambulatory Visit (INDEPENDENT_AMBULATORY_CARE_PROVIDER_SITE_OTHER): Admitting: Obstetrics & Gynecology

## 2024-11-15 VITALS — BP 110/83 | HR 76 | Ht 61.0 in | Wt 164.2 lb

## 2024-11-15 DIAGNOSIS — D6851 Activated protein C resistance: Secondary | ICD-10-CM | POA: Diagnosis not present

## 2024-11-15 DIAGNOSIS — G43009 Migraine without aura, not intractable, without status migrainosus: Secondary | ICD-10-CM

## 2024-11-15 DIAGNOSIS — Z79890 Hormone replacement therapy (postmenopausal): Secondary | ICD-10-CM

## 2024-11-15 DIAGNOSIS — Z1331 Encounter for screening for depression: Secondary | ICD-10-CM

## 2024-11-15 DIAGNOSIS — Z01419 Encounter for gynecological examination (general) (routine) without abnormal findings: Secondary | ICD-10-CM

## 2024-11-15 MED ORDER — PROGESTERONE MICRONIZED 100 MG PO CAPS
100.0000 mg | ORAL_CAPSULE | Freq: Every day | ORAL | 2 refills | Status: AC
  Filled 2024-11-15: qty 30, 30d supply, fill #0

## 2024-11-15 NOTE — Progress Notes (Signed)
 ANNUAL EXAM Patient name: Autumn [name] MRN [id number]  Date of birth: 1976-01-05 Chief Complaint:   Gynecologic Exam  History of Present Illness:   Autumn [name] is a 48 y.o. G19P2002 Caucasian female being seen today for a routine annual exam.  Having issues with sleep.  She is off prestiq and vyvanse .  She is using estradiol  0.025mg  patches.  She is interested in trying progesterone .    Patient's last menstrual period was 03/20/2020.  Last pap: Hysterectomy. H/O abnormal pap: yes patient had a Colopscopy and LEEP Last mammogram: 04/28/2024. Results were: normal. Family h/o breast cancer: yes paternal aunt. Last colonoscopy: 10/2020. Results were: normal. Family h/o colorectal cancer: yes maternal aunt.     11/15/2024    2:26 PM 10/20/2023   11:41 AM 09/17/2022    1:38 PM 01/06/2022    4:08 PM 12/17/2021    8:49 AM  Depression screen PHQ 2/9  Decreased Interest 0 0 0 0 0  Down, Depressed, Hopeless 0 0 0 0 0  PHQ - 2 Score 0 0 0 0 0        12/31/2020    1:50 PM  GAD 7 : Generalized Anxiety Score  Nervous, Anxious, on Edge   Control/stop worrying   Worry too much - different things   Trouble relaxing   Restless   Easily annoyed or irritable   Afraid - awful might happen   Total GAD 7 Score   Anxiety Difficulty      Information is confidential and restricted. Go to Review Flowsheets to unlock data.     Review of Systems:   Pertinent items are noted in HPI Denies any urinary or bowel changes.  Denies pelvic pain.   Pertinent History Reviewed:  Reviewed past medical,surgical, social and family history.  Reviewed problem list, medications and allergies. Physical Assessment:   Vitals:   11/15/24 1417  BP: 110/83  Pulse: 76  SpO2: 100%  Weight: 164 lb 3.2 oz (74.5 kg)  Height: 5' 1 (1.549 m)  Body mass index is 31.03 kg/m.        Physical Examination:   General appearance - well appearing, and in no distress  Mental status - alert, oriented to  person, place, and time  Psych:  She has a normal mood and affect  Skin - warm and dry, normal color, no suspicious lesions noted  Chest - effort normal, all lung fields clear to auscultation bilaterally  Heart - normal rate and regular rhythm  Neck:  midline trachea, no thyromegaly or nodules  Breasts - breasts appear normal, no suspicious masses, no skin or nipple changes or  axillary nodes  Abdomen - soft, nontender, nondistended, no masses or organomegaly  Pelvic - VULVA: normal appearing vulva with no masses, tenderness or lesions   VAGINA: normal appearing vagina with normal color and discharge, no lesions   CERVIX: surgically absent  Thin prep pap is not indicated  UTERUS: uterus is felt to be normal size, shape, consistency and nontender   ADNEXA: No adnexal masses or tenderness noted.  Rectal - normal rectal, good sphincter tone, no masses felt.   Extremities:  No swelling or varicosities noted  Chaperone present for exam  No results found for this or any previous visit (from the past 24 hours).  Assessment & Plan:  1. Well woman exam with routine gynecological exam (Primary) - Pap smear not indicated - Mammogram 5/28/205 - Colonoscopy 2021.  Follow up 10 years.   - Bone mineral  density guidelines reviewed - lab work ordered - vaccines reviewed/updated  2. Migraine without aura and without status migrainosus, not intractable - uses ubrelvy  prn with headache  3. Heterozygous factor V Leiden mutation - has seen Dr. Timmy   4. Hormone replacement therapy (HRT) - does not need Rf for estradiol  patch 0.025mg  to skin twice weekly.   - will try prometrium  100mg  nightly.  Rx to pharamcy.   No orders of the defined types were placed in this encounter.   Meds:  Meds ordered this encounter  Medications   progesterone  (PROMETRIUM ) 100 MG capsule    Sig: Take 1 capsule (100 mg total) by mouth daily.    Dispense:  30 capsule    Refill:  2    Follow-up: No follow-ups  on file.  Ronal GORMAN Pinal, MD 11/15/2024 3:08 PM

## 2024-11-16 ENCOUNTER — Other Ambulatory Visit (HOSPITAL_COMMUNITY): Payer: Self-pay

## 2024-11-16 ENCOUNTER — Encounter (HOSPITAL_COMMUNITY): Payer: Self-pay

## 2024-11-16 ENCOUNTER — Ambulatory Visit: Admitting: Family Medicine

## 2024-11-16 ENCOUNTER — Encounter: Payer: Self-pay | Admitting: Family Medicine

## 2024-11-16 ENCOUNTER — Telehealth (HOSPITAL_COMMUNITY): Payer: Self-pay | Admitting: Pharmacy Technician

## 2024-11-16 VITALS — BP 100/64 | HR 72 | Ht 61.0 in | Wt 162.2 lb

## 2024-11-16 DIAGNOSIS — Z0000 Encounter for general adult medical examination without abnormal findings: Secondary | ICD-10-CM

## 2024-11-16 DIAGNOSIS — R790 Abnormal level of blood mineral: Secondary | ICD-10-CM

## 2024-11-16 DIAGNOSIS — Z6830 Body mass index (BMI) 30.0-30.9, adult: Secondary | ICD-10-CM

## 2024-11-16 DIAGNOSIS — E559 Vitamin D deficiency, unspecified: Secondary | ICD-10-CM

## 2024-11-16 DIAGNOSIS — E66811 Obesity, class 1: Secondary | ICD-10-CM

## 2024-11-16 DIAGNOSIS — G43009 Migraine without aura, not intractable, without status migrainosus: Secondary | ICD-10-CM

## 2024-11-16 DIAGNOSIS — Z79899 Other long term (current) drug therapy: Secondary | ICD-10-CM

## 2024-11-16 DIAGNOSIS — R7301 Impaired fasting glucose: Secondary | ICD-10-CM

## 2024-11-16 DIAGNOSIS — Z1589 Genetic susceptibility to other disease: Secondary | ICD-10-CM | POA: Diagnosis not present

## 2024-11-16 MED ORDER — ZEPBOUND 15 MG/0.5ML ~~LOC~~ SOAJ
15.0000 mg | SUBCUTANEOUS | 1 refills | Status: AC
  Filled 2024-11-16 – 2024-11-17 (×2): qty 2, 28d supply, fill #0

## 2024-11-16 MED ORDER — UBRELVY 100 MG PO TABS
100.0000 mg | ORAL_TABLET | Freq: Every day | ORAL | 0 refills | Status: AC | PRN
  Filled 2024-11-16 – 2024-12-01 (×3): qty 16, 16d supply, fill #0

## 2024-11-17 ENCOUNTER — Other Ambulatory Visit (HOSPITAL_COMMUNITY): Payer: Self-pay

## 2024-11-17 LAB — COMPREHENSIVE METABOLIC PANEL WITH GFR
ALT: 16 U/L (ref 0–35)
AST: 18 U/L (ref 0–37)
Albumin: 4.1 g/dL (ref 3.5–5.2)
Alkaline Phosphatase: 62 U/L (ref 39–117)
BUN: 12 mg/dL (ref 6–23)
CO2: 31 meq/L (ref 19–32)
Calcium: 9.2 mg/dL (ref 8.4–10.5)
Chloride: 101 meq/L (ref 96–112)
Creatinine, Ser: 0.68 mg/dL (ref 0.40–1.20)
GFR: 101.61 mL/min (ref >60.00)
Glucose, Bld: 85 mg/dL (ref 70–99)
Potassium: 4.1 meq/L (ref 3.5–5.1)
Sodium: 139 meq/L (ref 135–145)
Total Bilirubin: 0.9 mg/dL (ref 0.2–1.2)
Total Protein: 6.9 g/dL (ref 6.0–8.3)

## 2024-11-17 LAB — TSH: TSH: 2.64 u[IU]/mL (ref 0.35–5.50)

## 2024-11-17 LAB — CBC WITH DIFFERENTIAL/PLATELET
Basophils Absolute: 0.1 K/uL (ref 0.0–0.1)
Basophils Relative: 0.7 % (ref 0.0–3.0)
Eosinophils Absolute: 0 K/uL (ref 0.0–0.7)
Eosinophils Relative: 0.5 % (ref 0.0–5.0)
HCT: 43.1 % (ref 36.0–46.0)
Hemoglobin: 14.3 g/dL (ref 12.0–15.0)
Lymphocytes Relative: 27 % (ref 12.0–46.0)
Lymphs Abs: 2.1 K/uL (ref 0.7–4.0)
MCHC: 33.2 g/dL (ref 30.0–36.0)
MCV: 91 fl (ref 78.0–100.0)
Monocytes Absolute: 0.5 K/uL (ref 0.1–1.0)
Monocytes Relative: 6.6 % (ref 3.0–12.0)
Neutro Abs: 5.1 K/uL (ref 1.4–7.7)
Neutrophils Relative %: 65.2 % (ref 43.0–77.0)
Platelets: 236 K/uL (ref 150.0–400.0)
RBC: 4.73 Mil/uL (ref 3.87–5.11)
RDW: 12.7 % (ref 11.5–15.5)
WBC: 7.8 K/uL (ref 4.0–10.5)

## 2024-11-17 LAB — VITAMIN B12: Vitamin B-12: 215 pg/mL (ref 211–911)

## 2024-11-17 LAB — LIPID PANEL
Cholesterol: 206 mg/dL — ABNORMAL HIGH (ref 0–200)
HDL: 67.8 mg/dL (ref >39.00)
LDL Cholesterol: 127 mg/dL — ABNORMAL HIGH (ref 0–99)
NonHDL: 137.81
Total CHOL/HDL Ratio: 3
Triglycerides: 53 mg/dL (ref 0.0–149.0)
VLDL: 10.6 mg/dL (ref 0.0–40.0)

## 2024-11-17 LAB — IRON,TIBC AND FERRITIN PANEL
%SAT: 43 % (ref 16–45)
Ferritin: 36 ng/mL (ref 16–232)
Iron: 129 ug/dL (ref 45–160)
TIBC: 302 ug/dL (ref 250–450)

## 2024-11-17 LAB — VITAMIN D 25 HYDROXY (VIT D DEFICIENCY, FRACTURES): VITD: 28.78 ng/mL — ABNORMAL LOW (ref 30.00–100.00)

## 2024-11-23 ENCOUNTER — Other Ambulatory Visit (HOSPITAL_COMMUNITY): Payer: Self-pay

## 2024-11-23 ENCOUNTER — Encounter (HOSPITAL_COMMUNITY): Payer: Self-pay

## 2024-11-24 ENCOUNTER — Other Ambulatory Visit (HOSPITAL_COMMUNITY): Payer: Self-pay

## 2024-11-25 ENCOUNTER — Encounter: Payer: Self-pay | Admitting: Family Medicine

## 2024-11-25 DIAGNOSIS — Z1589 Genetic susceptibility to other disease: Secondary | ICD-10-CM | POA: Insufficient documentation

## 2024-11-25 DIAGNOSIS — E559 Vitamin D deficiency, unspecified: Secondary | ICD-10-CM | POA: Insufficient documentation

## 2024-11-25 NOTE — Progress Notes (Signed)
 "  Patient Care Team: Prentiss Frieze, DO as PCP - General (Family Medicine) Cleotilde Ronal RAMAN, MD as Consulting Physician (Gynecology)  Subjective   Autumn [name] is a 48 y.o. female presenting for a comprehensive medical examination. She does not have additional problems to discuss today.   Review of Systems: Negative, with the exception of above mentioned in HPI.  History   Reviewed and updated by clinician on day of visit: allergies, medications, problem list, medical history, surgical history, family history, social history, and previous encounter notes.    Medications and Allergies   Show/hide medication list[1] Allergies[2]  Objective   BP 100/64 (BP Location: Right Arm, Cuff Size: Normal)   Pulse 72   Ht 5' 1 (1.549 m)   Wt 162 lb 3.2 oz (73.6 kg)   LMP 03/20/2020   SpO2 97%   BMI 30.65 kg/m    Physical Exam Constitutional:      General: She is not in acute distress.    Appearance: She is well-developed.  HENT:     Head: Normocephalic and atraumatic.     Mouth/Throat:     Mouth: Mucous membranes are moist.  Eyes:     Extraocular Movements: Extraocular movements intact.     Conjunctiva/sclera: Conjunctivae normal.  Cardiovascular:     Rate and Rhythm: Normal rate and regular rhythm.     Heart sounds: Normal heart sounds.  Pulmonary:     Effort: Pulmonary effort is normal.     Breath sounds: Normal breath sounds.  Musculoskeletal:        General: Normal range of motion.     Cervical back: Normal range of motion and neck supple.  Skin:    General: Skin is warm and dry.  Neurological:     General: No focal deficit present.     Mental Status: She is alert.  Psychiatric:        Mood and Affect: Mood normal.        Behavior: Behavior normal.       Results for orders placed or performed in visit on 11/16/24  B12   Collection Time: 11/16/24  2:57 PM  Result Value Ref Range   Vitamin B-12 215 211 - 911 pg/mL  Iron, TIBC and Ferritin Panel    Collection Time: 11/16/24  2:59 PM  Result Value Ref Range   Iron 129 45 - 160 mcg/dL   TIBC 697 749 - 549 mcg/dL (calc)   %SAT 43 16 - 45 % (calc)   Ferritin 36 16 - 232 ng/mL  VITAMIN D  25 Hydroxy (Vit-D Deficiency, Fractures)   Collection Time: 11/16/24  2:59 PM  Result Value Ref Range   VITD 28.78 (L) 30.00 - 100.00 ng/mL  Lipid panel   Collection Time: 11/16/24  2:59 PM  Result Value Ref Range   Cholesterol 206 (H) 0 - 200 mg/dL   Triglycerides 46.9 0.0 - 149.0 mg/dL   HDL 32.19 >60.99 mg/dL   VLDL 89.3 0.0 - 59.9 mg/dL   LDL Cholesterol 872 (H) 0 - 99 mg/dL   Total CHOL/HDL Ratio 3    NonHDL 137.81   TSH   Collection Time: 11/16/24  2:59 PM  Result Value Ref Range   TSH 2.64 0.35 - 5.50 uIU/mL  Comprehensive metabolic panel with GFR   Collection Time: 11/16/24  2:59 PM  Result Value Ref Range   Sodium 139 135 - 145 mEq/L   Potassium 4.1 3.5 - 5.1 mEq/L   Chloride 101 96 - 112  mEq/L   CO2 31 19 - 32 mEq/L   Glucose, Bld 85 70 - 99 mg/dL   BUN 12 6 - 23 mg/dL   Creatinine, Ser 9.31 0.40 - 1.20 mg/dL   Total Bilirubin 0.9 0.2 - 1.2 mg/dL   Alkaline Phosphatase 62 39 - 117 U/L   AST 18 0 - 37 U/L   ALT 16 0 - 35 U/L   Total Protein 6.9 6.0 - 8.3 g/dL   Albumin 4.1 3.5 - 5.2 g/dL   GFR 898.38 >39.99 mL/min   Calcium 9.2 8.4 - 10.5 mg/dL  CBC with Differential/Platelet   Collection Time: 11/16/24  2:59 PM  Result Value Ref Range   WBC 7.8 4.0 - 10.5 K/uL   RBC 4.73 3.87 - 5.11 Mil/uL   Hemoglobin 14.3 12.0 - 15.0 g/dL   HCT 56.8 63.9 - 53.9 %   MCV 91.0 78.0 - 100.0 fl   MCHC 33.2 30.0 - 36.0 g/dL   RDW 87.2 88.4 - 84.4 %   Platelets 236.0 150.0 - 400.0 K/uL   Neutrophils Relative % 65.2 43.0 - 77.0 %   Lymphocytes Relative 27.0 12.0 - 46.0 %   Monocytes Relative 6.6 3.0 - 12.0 %   Eosinophils Relative 0.5 0.0 - 5.0 %   Basophils Relative 0.7 0.0 - 3.0 %   Neutro Abs 5.1 1.4 - 7.7 K/uL   Lymphs Abs 2.1 0.7 - 4.0 K/uL   Monocytes Absolute 0.5 0.1 - 1.0 K/uL    Eosinophils Absolute 0.0 0.0 - 0.7 K/uL   Basophils Absolute 0.1 0.0 - 0.1 K/uL    Assessment and Plan   1. Routine general medical examination at a health care facility   2. Migraine without aura and without status migrainosus, not intractable   3. MTHFR mutation   4. Low ferritin   5. Vitamin D  deficiency   6. Impaired fasting glucose   7. Medication management   8. Class 1 obesity with serious comorbidity and body mass index (BMI) of 30.0 to 30.9 in adult, unspecified obesity type    Meds ordered this encounter  Medications   Ubrogepant  (UBRELVY ) 100 MG TABS    Sig: Take 1 tablet (100 mg total) by mouth daily as needed (migraine).    Dispense:  16 tablet    Refill:  0   tirzepatide  (ZEPBOUND ) 15 MG/0.5ML Pen    Sig: Inject 15 mg into the skin once a week.    Dispense:  2 mL    Refill:  1   Orders Placed This Encounter  Procedures   B12   Health Maintenance   Immunization History  Administered Date(s) Administered   Hepatitis A, Adult 04/20/2017, 10/18/2017   Hepatitis B 04/20/2017, 06/22/2017, 10/18/2017   Influenza Inj Mdck Quad Pf 11/11/2016, 10/18/2017, 08/31/2019, 07/29/2021, 08/06/2022, 08/12/2023   Influenza Split 09/06/2010   Influenza, Mdck, Trivalent,PF 6+ MOS(egg free) 09/29/2018   Influenza, Seasonal, Injecte, Preservative Fre 10/26/2024   Influenza,inj,quad, With Preservative 09/29/2018   Influenza-Unspecified 09/06/2016, 10/18/2017, 08/29/2019, 07/30/2021, 08/06/2022, 08/22/2023   PFIZER(Purple Top)SARS-COV-2 Vaccination 02/22/2020, 03/14/2020   Tdap 12/08/2011, 07/29/2021   Health Maintenance  Topic Date Due   Hepatitis C Screening  Never done   COVID-19 Vaccine (3 - Pfizer risk series) 04/11/2020   Pneumococcal Vaccine: 50+ Years (1 of 1 - PCV) Never done   Zoster Vaccines- Shingrix (1 of 2) 02/13/2025 (Originally 07/18/1993)   Mammogram  04/28/2026   Colonoscopy  09/16/2030   DTaP/Tdap/Td (3 - Td or Tdap) 07/30/2031  Influenza Vaccine   Completed   Hepatitis B Vaccines 19-59 Average Risk  Completed   HIV Screening  Completed   HPV VACCINES  Aged Out   Meningococcal B Vaccine  Aged Out   Screening   Flowsheet Row Office Visit from 10/26/2024 in [facility]  AUDIT-C Score 3       11/16/2024    2:40 PM 11/15/2024    2:26 PM 10/20/2023   11:41 AM  Depression screen PHQ 2/9  Decreased Interest 1 0 0  Down, Depressed, Hopeless 1 0 0  PHQ - 2 Score 2 0 0  Altered sleeping 1    Tired, decreased energy 1    Change in appetite 0    Feeling bad or failure about yourself  0    Trouble concentrating 0    Moving slowly or fidgety/restless 0    PHQ-9 Score 4    Difficult doing work/chores Not difficult at all        11/16/2024    2:40 PM 10/20/2023   11:41 AM 09/17/2022    1:39 PM  Fall Risk   Falls in the past year? 0 0 0  Number falls in past yr:  0 0  Injury with Fall?  0  0      Data saved with a previous flowsheet row definition   Preventive Health Recommendations   Sexual Health  Use condoms consistently to reduce STI risk  Choose partners carefully  Use effective contraception to prevent unintended pregnancy  Learn more: savingpics.uy  Tobacco & Nicotine  Avoid smoking and all tobacco or nicotine products  Quit support available: https://smokefree.gov  Alcohol  Best option is no alcohol  If drinking, limit to <=7 drinks/week and <=3 drinks/occasion (women)  Higher intake increases health risks  Learn more: http://www.green.com/  Substance Use  Avoid illicit drug use  Confidential treatment resources available if needed  Help: identitylist.se  Nutrition & Weight Health (Obesity Medicine-Based)  Follow a whole-food, nutrient-dense eating pattern  Prioritize adequate protein to support muscle and metabolism  Emphasize vegetables, fruits, lean proteins, legumes, and healthy fats  Limit ultra-processed foods,  added sugars, sugary beverages, and refined carbohydrates  Choose mostly unsaturated fats; limit saturated fats  Aim to meet fiber, calcium, potassium, and micronutrient needs through food  No single best diet--Mediterranean, DASH, and plant-forward patterns can all be effective when individualized  Learn more: tonerproviders.com.cy  Physical Activity  Engage in regular physical activity appropriate for your abilities  Include aerobic activity, strength training, and flexibility  Progress gradually and focus on consistency  Exercise guidance: https://www.acsm.org/education-resources/trending-topics-resources/physical-activity-guidelines  Injury & Home Safety  Use seat belts and appropriate helmets  Maintain working smoke detectors  Avoid smoking near bedding or upholstered furniture  Oral Health  Brush teeth twice daily and floss regularly  Maintain routine dental visits  Learn more: carpetlickers.tn  Follow-Up  Next preventive physical recommended in 1 year  Geni Shutter, DO, MS, FAAFP, Dipl. [facility] [address] [place], [address] Dept: [phone] Dept Fax: [phone]     [1]  Outpatient Medications Prior to Visit  Medication Sig   benzonatate (TESSALON) 200 MG capsule    clobetasol (TEMOVATE) 0.05 % external solution SMARTSIG:sparingly As Directed   estradiol  (VIVELLE -DOT) 0.025 MG/24HR PLACE 1 PATCH ON THE SKIN TWICE A WEEK   Ferric Maltol  (ACCRUFER ) 30 MG CAPS Take 1 capsule (30 mg total) by mouth daily.   meloxicam  (MOBIC ) 15 MG  tablet Take 1 tablet (15 mg total) by mouth daily as needed for pain.   metroNIDAZOLE  (METROCREAM ) 0.75 % cream SMARTSIG:sparingly Topical Daily   Multiple Vitamin (MULTIVITAMIN WITH MINERALS) TABS tablet Take 1 tablet by mouth daily.   ondansetron  (ZOFRAN ) 4 MG tablet Take 1 tablet (4 mg total) by mouth every 6 (six) hours as needed for  nausea and vomiting   progesterone  (PROMETRIUM ) 100 MG capsule Take 1 capsule (100 mg total) by mouth daily.   promethazine -dextromethorphan (PROMETHAZINE -DM) 6.25-15 MG/5ML syrup Take 5 mLs by mouth 4 (four) times daily as needed for cough.   traZODone  (DESYREL ) 50 MG tablet Take 25 mg by mouth.   [DISCONTINUED] tirzepatide  (ZEPBOUND ) 15 MG/0.5ML Pen Inject 15 mg into the skin once a week.   [DISCONTINUED] Ubrogepant  (UBRELVY ) 100 MG TABS Take 1 tablet (100 mg total) by mouth daily as needed (migraine).   No facility-administered medications prior to visit.  [2]  Allergies Allergen Reactions   Pollen Extract Other (See Comments)   "

## 2024-11-26 ENCOUNTER — Ambulatory Visit: Payer: Self-pay | Admitting: Family Medicine

## 2024-11-26 DIAGNOSIS — E559 Vitamin D deficiency, unspecified: Secondary | ICD-10-CM

## 2024-11-26 MED ORDER — VITAMIN D (ERGOCALCIFEROL) 1.25 MG (50000 UNIT) PO CAPS: 50000.0000 [IU] | ORAL_CAPSULE | ORAL | 0 refills | Status: AC

## 2024-11-27 ENCOUNTER — Other Ambulatory Visit (HOSPITAL_COMMUNITY): Payer: Self-pay

## 2024-11-27 MED ORDER — TRAZODONE HCL 50 MG PO TABS
50.0000 mg | ORAL_TABLET | Freq: Every day | ORAL | 0 refills | Status: AC
  Filled 2024-11-27: qty 30, 30d supply, fill #0

## 2024-11-27 NOTE — Telephone Encounter (Addendum)
 Please see pts question. I have pended the Trazadone to send, I have confirmed her pharmacy. Thank you

## 2024-12-01 ENCOUNTER — Other Ambulatory Visit (HOSPITAL_COMMUNITY): Payer: Self-pay

## 2024-12-04 ENCOUNTER — Other Ambulatory Visit (HOSPITAL_COMMUNITY): Payer: Self-pay

## 2024-12-05 ENCOUNTER — Other Ambulatory Visit (HOSPITAL_COMMUNITY): Payer: Self-pay

## 2024-12-05 NOTE — Telephone Encounter (Signed)
 Pharmacy Patient Advocate Encounter   Received notification from Patient Pharmacy that prior authorization for  Ubrelvy  100 mg tablets  is required/requested.   Insurance verification completed.   The patient is insured through [facility].   Per test claim: PA required; PA submitted to above mentioned insurance via Latent Key/confirmation #/EOC [id number] Status is pending   *original request was put in for 30 tabs per 30 days and it got denied. Had to resubmit for 16 tabs every 30 days

## 2024-12-06 ENCOUNTER — Telehealth: Admitting: Nurse Practitioner

## 2024-12-06 ENCOUNTER — Other Ambulatory Visit (HOSPITAL_COMMUNITY): Payer: Self-pay

## 2024-12-06 DIAGNOSIS — J0140 Acute pansinusitis, unspecified: Secondary | ICD-10-CM

## 2024-12-06 MED ORDER — AMOXICILLIN-POT CLAVULANATE 875-125 MG PO TABS
1.0000 | ORAL_TABLET | Freq: Two times a day (BID) | ORAL | 0 refills | Status: AC
  Filled 2024-12-06: qty 14, 7d supply, fill #0

## 2024-12-06 NOTE — Telephone Encounter (Signed)
 Pharmacy Patient Advocate Encounter  Received notification from [facility] that Prior Authorization for  Ubrelvy  100 mg tablets  has been APPROVED from 12/05/24 to 12/05/25. Ran test claim, Copay is $0. This test claim was processed through [facility]- copay amounts may vary at other pharmacies due to pharmacy/plan contracts, or as the patient moves through the different stages of their insurance plan.   PA #/Case ID/Reference #: [id number]

## 2024-12-06 NOTE — Progress Notes (Signed)
 E-Visit for Sinus Problems  We are sorry that you are not feeling well.  Here is how we plan to help!  Based on what you have shared with me it looks like you have sinusitis.  Sinusitis is inflammation and infection in the sinus cavities of the head.  Based on your presentation I believe you most likely have Acute Bacterial Sinusitis.  This is an infection caused by bacteria and is treated with antibiotics. I have prescribed Augmentin  875mg /125mg  one tablet twice daily with food, for 7 days. You may use an oral decongestant such as Mucinex D or if you have glaucoma or high blood pressure use plain Mucinex. Saline nasal spray help and can safely be used as often as needed for congestion.  If you develop worsening sinus pain, fever or notice severe headache and vision changes, or if symptoms are not better after completion of antibiotic, please schedule an appointment with a health care provider.    Sinus infections are not as easily transmitted as other respiratory infection, however we still recommend that you avoid close contact with loved ones, especially the very young and elderly.  Remember to wash your hands thoroughly throughout the day as this is the number one way to prevent the spread of infection!  Home Care: Only take medications as instructed by your medical team. Complete the entire course of an antibiotic. Do not take these medications with alcohol. A steam or ultrasonic humidifier can help congestion.  You can place a towel over your head and breathe in the steam from hot water coming from a faucet. Avoid close contacts especially the very young and the elderly. Cover your mouth when you cough or sneeze. Always remember to wash your hands.  Get Help Right Away If: You develop worsening fever or sinus pain. You develop a severe head ache or visual changes. Your symptoms persist after you have completed your treatment plan.  Make sure you Understand these instructions. Will  watch your condition. Will get help right away if you are not doing well or get worse.  Your e-visit answers were reviewed by a board certified advanced clinical practitioner to complete your personal care plan.  Depending on the condition, your plan could have included both over the counter or prescription medications.  If there is a problem please reply  once you have received a response from your provider.  Your safety is important to us .  If you have drug allergies check your prescription carefully.    You can use MyChart to ask questions about today's visit, request a non-urgent call back, or ask for a work or school excuse for 24 hours related to this e-Visit. If it has been greater than 24 hours you will need to follow up with your provider, or enter a new e-Visit to address those concerns.  You will get an e-mail in the next two days asking about your experience.  I hope that your e-visit has been valuable and will speed your recovery. Thank you for using e-visits.  I have spent 5 minutes in review of e-visit questionnaire, review and updating patient chart, medical decision making and response to patient.   Lauraine Kitty, FNP

## 2024-12-15 ENCOUNTER — Other Ambulatory Visit (HOSPITAL_BASED_OUTPATIENT_CLINIC_OR_DEPARTMENT_OTHER): Payer: Self-pay

## 2024-12-21 ENCOUNTER — Other Ambulatory Visit (HOSPITAL_BASED_OUTPATIENT_CLINIC_OR_DEPARTMENT_OTHER): Payer: Self-pay

## 2024-12-21 DIAGNOSIS — Z79890 Hormone replacement therapy (postmenopausal): Secondary | ICD-10-CM

## 2024-12-21 MED ORDER — ESTRADIOL 0.025 MG/24HR TD PTTW: MEDICATED_PATCH | TRANSDERMAL | 0 refills | Status: AC

## 2025-01-03 ENCOUNTER — Ambulatory Visit: Admitting: Family Medicine

## 2025-02-14 ENCOUNTER — Ambulatory Visit: Admitting: Family Medicine
